# Patient Record
Sex: Female | Born: 1975
Health system: Southern US, Community
[De-identification: ages and names within clinical notes are randomized; demographics above are authoritative.]

## PROBLEM LIST (undated history)

## (undated) ENCOUNTER — Inpatient Hospital Stay (HOSPITAL_COMMUNITY): Payer: Self-pay

## (undated) DIAGNOSIS — N200 Calculus of kidney: Secondary | ICD-10-CM

## (undated) DIAGNOSIS — M51369 Other intervertebral disc degeneration, lumbar region without mention of lumbar back pain or lower extremity pain: Secondary | ICD-10-CM

## (undated) DIAGNOSIS — IMO0002 Reserved for concepts with insufficient information to code with codable children: Secondary | ICD-10-CM

## (undated) DIAGNOSIS — M5136 Other intervertebral disc degeneration, lumbar region: Secondary | ICD-10-CM

## (undated) DIAGNOSIS — F419 Anxiety disorder, unspecified: Secondary | ICD-10-CM

## (undated) DIAGNOSIS — K589 Irritable bowel syndrome without diarrhea: Secondary | ICD-10-CM

## (undated) DIAGNOSIS — Z9889 Other specified postprocedural states: Secondary | ICD-10-CM

## (undated) HISTORY — DX: Calculus of kidney: N20.0

## (undated) HISTORY — DX: Irritable bowel syndrome, unspecified: K58.9

## (undated) HISTORY — DX: Anxiety disorder, unspecified: F41.9

## (undated) HISTORY — DX: Other specified postprocedural states: Z98.890

---

## 1998-05-28 ENCOUNTER — Other Ambulatory Visit: Admission: RE | Admit: 1998-05-28 | Discharge: 1998-05-28 | Payer: Self-pay | Admitting: Obstetrics & Gynecology

## 1999-07-01 ENCOUNTER — Other Ambulatory Visit: Admission: RE | Admit: 1999-07-01 | Discharge: 1999-07-01 | Payer: Self-pay | Admitting: Obstetrics & Gynecology

## 2001-01-05 ENCOUNTER — Encounter: Payer: Self-pay | Admitting: Emergency Medicine

## 2001-01-05 ENCOUNTER — Emergency Department (HOSPITAL_COMMUNITY): Admission: EM | Admit: 2001-01-05 | Discharge: 2001-01-05 | Payer: Self-pay | Admitting: Emergency Medicine

## 2001-05-30 ENCOUNTER — Ambulatory Visit (HOSPITAL_COMMUNITY): Admission: RE | Admit: 2001-05-30 | Discharge: 2001-05-30 | Payer: Self-pay | Admitting: Family Medicine

## 2001-05-30 ENCOUNTER — Encounter: Payer: Self-pay | Admitting: Family Medicine

## 2002-01-01 ENCOUNTER — Encounter: Payer: Self-pay | Admitting: Family Medicine

## 2002-01-01 ENCOUNTER — Ambulatory Visit (HOSPITAL_COMMUNITY): Admission: RE | Admit: 2002-01-01 | Discharge: 2002-01-01 | Payer: Self-pay | Admitting: Family Medicine

## 2002-04-28 ENCOUNTER — Encounter: Payer: Self-pay | Admitting: Emergency Medicine

## 2002-04-28 ENCOUNTER — Emergency Department (HOSPITAL_COMMUNITY): Admission: EM | Admit: 2002-04-28 | Discharge: 2002-04-28 | Payer: Self-pay | Admitting: Emergency Medicine

## 2002-05-14 ENCOUNTER — Other Ambulatory Visit: Admission: RE | Admit: 2002-05-14 | Discharge: 2002-05-14 | Payer: Self-pay | Admitting: Obstetrics and Gynecology

## 2003-01-31 ENCOUNTER — Ambulatory Visit (HOSPITAL_COMMUNITY): Admission: RE | Admit: 2003-01-31 | Discharge: 2003-01-31 | Payer: Self-pay | Admitting: Family Medicine

## 2003-01-31 ENCOUNTER — Encounter: Payer: Self-pay | Admitting: Family Medicine

## 2003-07-11 ENCOUNTER — Emergency Department (HOSPITAL_COMMUNITY): Admission: EM | Admit: 2003-07-11 | Discharge: 2003-07-11 | Payer: Self-pay | Admitting: Emergency Medicine

## 2004-02-27 ENCOUNTER — Other Ambulatory Visit: Admission: RE | Admit: 2004-02-27 | Discharge: 2004-02-27 | Payer: Self-pay | Admitting: Obstetrics & Gynecology

## 2004-03-02 ENCOUNTER — Encounter: Admission: RE | Admit: 2004-03-02 | Discharge: 2004-03-02 | Payer: Self-pay | Admitting: Obstetrics & Gynecology

## 2005-03-22 ENCOUNTER — Ambulatory Visit (HOSPITAL_COMMUNITY): Admission: RE | Admit: 2005-03-22 | Discharge: 2005-03-22 | Payer: Self-pay | Admitting: Family Medicine

## 2007-06-28 ENCOUNTER — Emergency Department (HOSPITAL_COMMUNITY): Admission: EM | Admit: 2007-06-28 | Discharge: 2007-06-28 | Payer: Self-pay | Admitting: Emergency Medicine

## 2007-07-02 ENCOUNTER — Emergency Department (HOSPITAL_COMMUNITY): Admission: EM | Admit: 2007-07-02 | Discharge: 2007-07-02 | Payer: Self-pay | Admitting: Emergency Medicine

## 2007-07-03 ENCOUNTER — Emergency Department (HOSPITAL_COMMUNITY): Admission: EM | Admit: 2007-07-03 | Discharge: 2007-07-03 | Payer: Self-pay | Admitting: Emergency Medicine

## 2007-12-01 ENCOUNTER — Ambulatory Visit (HOSPITAL_COMMUNITY): Admission: RE | Admit: 2007-12-01 | Discharge: 2007-12-01 | Payer: Self-pay | Admitting: Internal Medicine

## 2007-12-13 ENCOUNTER — Encounter (HOSPITAL_COMMUNITY): Admission: RE | Admit: 2007-12-13 | Discharge: 2008-01-12 | Payer: Self-pay | Admitting: Internal Medicine

## 2007-12-20 HISTORY — PX: OTHER SURGICAL HISTORY: SHX169

## 2008-01-04 ENCOUNTER — Ambulatory Visit: Payer: Self-pay | Admitting: Internal Medicine

## 2008-01-19 ENCOUNTER — Ambulatory Visit (HOSPITAL_COMMUNITY): Admission: RE | Admit: 2008-01-19 | Discharge: 2008-01-19 | Payer: Self-pay | Admitting: Internal Medicine

## 2008-01-19 ENCOUNTER — Ambulatory Visit: Payer: Self-pay | Admitting: Internal Medicine

## 2008-02-29 ENCOUNTER — Ambulatory Visit: Payer: Self-pay | Admitting: Internal Medicine

## 2008-08-09 ENCOUNTER — Ambulatory Visit (HOSPITAL_COMMUNITY): Admission: RE | Admit: 2008-08-09 | Discharge: 2008-08-09 | Payer: Self-pay | Admitting: Obstetrics & Gynecology

## 2009-02-27 ENCOUNTER — Emergency Department (HOSPITAL_COMMUNITY): Admission: EM | Admit: 2009-02-27 | Discharge: 2009-02-27 | Payer: Self-pay | Admitting: Emergency Medicine

## 2009-06-03 ENCOUNTER — Ambulatory Visit (HOSPITAL_COMMUNITY): Admission: RE | Admit: 2009-06-03 | Discharge: 2009-06-03 | Payer: Self-pay | Admitting: Family Medicine

## 2010-07-12 ENCOUNTER — Encounter: Payer: Self-pay | Admitting: Internal Medicine

## 2010-08-24 ENCOUNTER — Ambulatory Visit (HOSPITAL_COMMUNITY)
Admission: RE | Admit: 2010-08-24 | Discharge: 2010-08-24 | Disposition: A | Discharge status: Home / Self Care | Payer: 59 | Source: Ambulatory Visit | Attending: Family Medicine | Admitting: Family Medicine

## 2010-08-24 ENCOUNTER — Other Ambulatory Visit (HOSPITAL_COMMUNITY): Payer: Self-pay | Admitting: Family Medicine

## 2010-08-24 DIAGNOSIS — R079 Chest pain, unspecified: Secondary | ICD-10-CM | POA: Insufficient documentation

## 2010-08-26 ENCOUNTER — Other Ambulatory Visit (HOSPITAL_COMMUNITY): Payer: Self-pay | Admitting: Family Medicine

## 2010-08-26 DIAGNOSIS — M940 Chondrocostal junction syndrome [Tietze]: Secondary | ICD-10-CM

## 2010-08-28 ENCOUNTER — Ambulatory Visit (HOSPITAL_COMMUNITY)
Admission: RE | Admit: 2010-08-28 | Discharge: 2010-08-28 | Disposition: A | Discharge status: Home / Self Care | Payer: 59 | Source: Ambulatory Visit | Attending: Family Medicine | Admitting: Family Medicine

## 2010-08-28 DIAGNOSIS — M940 Chondrocostal junction syndrome [Tietze]: Secondary | ICD-10-CM | POA: Insufficient documentation

## 2010-08-28 DIAGNOSIS — R109 Unspecified abdominal pain: Secondary | ICD-10-CM | POA: Insufficient documentation

## 2010-09-20 DIAGNOSIS — Z9889 Other specified postprocedural states: Secondary | ICD-10-CM

## 2010-09-20 HISTORY — DX: Other specified postprocedural states: Z98.890

## 2010-09-25 LAB — CBC
HCT: 38.4 % (ref 36.0–46.0)
MCV: 95.5 fL (ref 78.0–100.0)
RBC: 4.02 MIL/uL (ref 3.87–5.11)
WBC: 6.3 10*3/uL (ref 4.0–10.5)

## 2010-09-25 LAB — DIFFERENTIAL
Eosinophils Absolute: 0.1 10*3/uL (ref 0.0–0.7)
Eosinophils Relative: 2 % (ref 0–5)
Lymphocytes Relative: 38 % (ref 12–46)
Lymphs Abs: 2.4 10*3/uL (ref 0.7–4.0)
Monocytes Relative: 7 % (ref 3–12)

## 2010-09-25 LAB — POCT CARDIAC MARKERS
Myoglobin, poc: 51.1 ng/mL (ref 12–200)
Troponin i, poc: <0.05 ng/mL (ref 0.00–0.09)

## 2010-09-25 LAB — BASIC METABOLIC PANEL
Chloride: 104 mEq/L (ref 96–112)
GFR calc Af Amer: >60 mL/min (ref >60)
Potassium: 3.8 mEq/L (ref 3.5–5.1)
Sodium: 136 mEq/L (ref 135–145)

## 2010-09-25 LAB — PREGNANCY, URINE: Preg Test, Ur: NEGATIVE

## 2010-09-28 ENCOUNTER — Ambulatory Visit (INDEPENDENT_AMBULATORY_CARE_PROVIDER_SITE_OTHER): Payer: 59 | Admitting: Gastroenterology

## 2010-09-28 ENCOUNTER — Encounter: Payer: Self-pay | Admitting: Gastroenterology

## 2010-09-28 VITALS — BP 113/68 | HR 78 | Temp 98.3°F | Ht 65.0 in | Wt 162.5 lb

## 2010-09-28 DIAGNOSIS — R1013 Epigastric pain: Secondary | ICD-10-CM

## 2010-09-28 DIAGNOSIS — R1310 Dysphagia, unspecified: Secondary | ICD-10-CM | POA: Insufficient documentation

## 2010-09-28 DIAGNOSIS — R1314 Dysphagia, pharyngoesophageal phase: Secondary | ICD-10-CM

## 2010-09-28 DIAGNOSIS — K219 Gastro-esophageal reflux disease without esophagitis: Secondary | ICD-10-CM | POA: Insufficient documentation

## 2010-09-28 DIAGNOSIS — K589 Irritable bowel syndrome without diarrhea: Secondary | ICD-10-CM

## 2010-09-28 MED ORDER — POLYETHYLENE GLYCOL 3350 17 GM/SCOOP PO POWD: 17.0000 g | Freq: Every day | ORAL | Status: AC | PRN

## 2010-09-28 MED ORDER — ALIGN 4 MG PO CAPS
4.0000 mg | ORAL_CAPSULE | Freq: Every day | ORAL | Status: DC
Start: 2010-09-28 — End: 2011-06-03

## 2010-09-28 NOTE — Progress Notes (Signed)
Primary Care Physician:  Colette Ribas, MD  Chief Complaint  Patient presents with  . GI Problem    every now and then food wants to come back up per pt    HPI:  Brianna Davis is a 35 y.o. female here for further evaluation of abdominal pain and mild dysphagia. She was last seen by our practice back in 2009. It was for follow up of irritable bowel syndrome. At that time she also had a negative workup for gallbladder disease including a negative HIDA scan and gallbladder ultrasound. She states recently she started having epigastric pain and pain in her chest. She saw her primary care physician who thought that she had costochondritis. She was told to take ibuprofen which did not help. She reports having unremarkable chest x-ray as well. Abdominal ultrasound negative. She was advised to take Prilosec but she decided not to try it. She's been taking TUMS without relief. With regards for irritable bowel syndrome, she now has more constipation. In the past it was mostly diarrhea. She may go 5 days without a bowel movement and had multiple stools. Complains of feeling sore afterwards. Denies melena and rectal bleeding. Chest pain after ate pizza. Belching for hours after meals. No heartburn. Dysphagia to foods and carbonation. Chest pain occasionally. Milk bloats. No weight loss.   Vit D supplement OTC.   Current Outpatient Prescriptions  Medication Sig Dispense Refill  . ALPRAZolam (XANAX) 0.25 MG tablet Take 1 tablet by mouth as needed.      . polyethylene glycol powder (GLYCOLAX/MIRALAX) powder Take 17 g by mouth daily as needed.  255 g  0  . Probiotic Product (ALIGN) 4 MG CAPS Take 4 mg by mouth daily.  30 capsule  0    Allergies as of 09/28/2010  . (No Known Allergies)    Past Medical History  Diagnosis Date  . Migraine   . Nephrolithiasis   . Anxiety   . IBS (irritable bowel syndrome)     Past Surgical History  Procedure Date  . Ileocolonoscopy 12/2007    Friable anal  canal.   Family History  Problem Relation Age of Onset  . Irritable bowel syndrome Mother   . Colon cancer Neg Hx   . Inflammatory bowel disease Neg Hx   . Liver disease Neg Hx   . GI problems Sister     gastroparesis      History   Social History  . Marital Status: Married    Spouse Name: N/A    Number of Children: 0  . Years of Education: N/A   Occupational History  .  [employer]    works from home   Social History Main Topics  . Smoking status: Current Everyday Smoker -- 1.0 packs/day for 14 years    Types: Cigarettes  . Smokeless tobacco: Never Used  . Alcohol Use: No  . Drug Use: No  . Sexually Active: Yes -- Female partner(s)    Birth Control/ Protection: None     spouse        ROS:  General: Negative for anorexia, weight loss, fever, chills, fatigue, weakness. Eyes: Negative for vision changes.  ENT: Negative for hoarseness, difficulty swallowing , nasal congestion. CV: Negative for chest pain, angina, palpitations, dyspnea on exertion, peripheral edema.  Respiratory: Negative for dyspnea at rest, dyspnea on exertion, cough, sputum, wheezing.  GI: See history of present illness. GU:  Negative for dysuria, hematuria, urinary incontinence, urinary frequency, nocturnal urination.  MS: Negative for joint  pain, low back pain.  Derm: Negative for rash or itching.  Neuro: Negative for weakness, abnormal sensation, seizure, frequent headaches, memory loss, confusion.  Psych: Negative for anxiety, depression, suicidal ideation, hallucinations.  Endo: Negative for unusual weight change.  Heme: Negative for bruising or bleeding. Allergy: Negative for rash or hives.    Physical Examination: BP 113/68  Pulse 78  Temp 98.3 F (36.8 C)  Ht 5\' 5"  (1.651 m)  Wt 162 lb 8 oz (73.71 kg)  BMI 27.04 kg/m2  SpO2 100%  LMP 09/06/2010   General: Well-nourished, well-developed in no acute distress.  Head: Normocephalic, atraumatic.   Eyes: Conjunctiva pink, no  icterus. Mouth: Oropharyngeal mucosa moist and pink , no lesions erythema or exudate. Neck: Supple without thyromegaly, masses, or lymphadenopathy.  Lungs: Clear to auscultation bilaterally.  Heart: Regular rate and rhythm, no murmurs rubs or gallops.  Abdomen: Bowel sounds are normal, epigastric tenderness, nondistended, no hepatosplenomegaly or masses, no abdominal bruits or    hernia , no rebound or guarding.   Extremities: No lower extremity edema.  Neuro: Alert and oriented x 4 , grossly normal neurologically.  Skin: Warm and dry, no rash or jaundice.   Psych: Alert and cooperative, normal mood and affect.

## 2010-09-28 NOTE — Assessment & Plan Note (Addendum)
Chest pain related to certain foods most likely due to GERD. Excessive belching likely due to GERD as well. Await EGD findings.

## 2010-09-28 NOTE — Assessment & Plan Note (Signed)
Progressive epigastric pain, usually related to meals. Differential diagnosis includes gastritis, peptic ulcer disease, GERD, less likely gallbladder disease given recent normal abdominal ultrasound and normal HIDA scan 2 years ago. Recommend upper endoscopy for further evaluation. I have discussed the risks, alternatives, benefits with regards to but not limited to the risk of reaction to medication, bleeding, infection, perforation and the patient is agreeable to proceed. Written consent to be obtained.

## 2010-09-28 NOTE — Assessment & Plan Note (Signed)
Dysphagia to solid foods and carbonation. New onset of symptoms. Intermittent at this time. Recommend upper endoscopy for further evaluation and possible dilation. I have discussed the risks, alternatives, benefits with regards to but not limited to the risk of reaction to medication, bleeding, infection, perforation and the patient is agreeable to proceed. Written consent to be obtained.

## 2010-09-28 NOTE — Assessment & Plan Note (Signed)
Now with more constipation. MiraLax 17 g daily as needed. Align one daily for the next 4 weeks.

## 2010-09-29 NOTE — Progress Notes (Signed)
Reviewed by R. Michael Dorette Hartel, MD FACP FACG 

## 2010-09-30 ENCOUNTER — Encounter: Payer: 59 | Admitting: Internal Medicine

## 2010-09-30 ENCOUNTER — Other Ambulatory Visit: Payer: Self-pay | Admitting: Internal Medicine

## 2010-09-30 ENCOUNTER — Ambulatory Visit (HOSPITAL_COMMUNITY)
Admission: RE | Admit: 2010-09-30 | Discharge: 2010-09-30 | Disposition: A | Discharge status: Home / Self Care | Payer: 59 | Source: Ambulatory Visit | Attending: Internal Medicine | Admitting: Internal Medicine

## 2010-09-30 DIAGNOSIS — K2940 Chronic atrophic gastritis without bleeding: Secondary | ICD-10-CM | POA: Insufficient documentation

## 2010-09-30 DIAGNOSIS — R1013 Epigastric pain: Secondary | ICD-10-CM | POA: Insufficient documentation

## 2010-09-30 DIAGNOSIS — R1310 Dysphagia, unspecified: Secondary | ICD-10-CM

## 2010-09-30 HISTORY — PX: ESOPHAGOGASTRODUODENOSCOPY: SHX1529

## 2010-10-05 NOTE — Op Note (Signed)
NAMESION, THANE            ACCOUNT NO.:  [id number]  MEDICAL RECORD NO.:  [id number]           PATIENT TYPE:  O  LOCATION:  [facility]                          FACILITY:  [facility]  PHYSICIAN:  R. Roetta Sessions, M.D. DATE OF BIRTH:  12-27-1975  DATE OF PROCEDURE:  09/30/2010 DATE OF DISCHARGE:                              OPERATIVE REPORT   INDICATIONS FOR PROCEDURE:  A 35 year old lady with chest pain related to certain intake of food along with belching, some vague dysphagia symptoms which now the patient really denies.  I suspect element of gastroesophageal reflux disease.  She has declined to take any proton pump inhibitor therapy previously, has had a gallbladder workup in the last 2 years which turned out okay.  EGD is now being done to further evaluate her symptoms.  Risks, benefits, limitations, alternatives and imponderables have been discussed, questions answered.  Please see the documentation in the medical record.  PROCEDURE NOTE:  O2 saturation, blood pressure, pulse and respirations were monitored throughout the entire procedure.  CONSCIOUS SEDATION: 1. Versed 9 mg IV. 2. Demerol 100 mg IV in divided doses. 3. Phenergan 25 mg dilutional IV push in divided doses to augment     conscious sedation.  Cetacaine spray for topical pharyngeal     anesthesia.  INSTRUMENT:  Pentax video chip system.  FINDINGS:  Examination of the tubular esophagus revealed friability at the GE junction with accentuated undulating Z-line, question short segment Barrett's esophagus.  The tubular esophagus was widely patent through the GE junction.  Stomach:  Gastric cavity was emptied and insufflated well with air.  Thorough examination of  gastric mucosa including retroflexion view of the proximal stomach, esophagogastric junction demonstrated a small hiatal hernia and diffuse submucosal gastric petechiae.  There was no ulcer or infiltrating process.  Pylorus was patent and easily  traversed.  Examination of the bulb and second portion revealed no abnormalities.  THERAPEUTIC/DIAGNOSTIC MANEUVERS PERFORMED: 1. Distal esophageal mucosa was biopsied for histologic study. 2. Gastric mucosal biopsies were taken to rule out H. Pylori.  The     patient tolerated the procedure well.  IMPRESSION: 1. Friability at the gastroesophageal junction with an accentuated     undulating Z-line versus short segment Barrett esophagus, status     post biopsy. 2. Small hiatal hernia. 3. Diffuse submucosal gastric petechiae, status post biopsy.  Patent     pylorus normal D1-D2.  I suspect the patient's symptoms are a large part reflux related.  RECOMMENDATIONS: 1. Began course of proton pump inhibitor therapy in way of Dexilant 60     mg orally daily.  We will arrange for her by the office to get a 2     weeks supply of free samples and see how she does with this regimen     and we will go from there. 2. Followup on path. 3. Further recommendations to follow.     Jonathon Bellows, M.D.     RMR/MEDQ  D:  09/30/2010  T:  10/01/2010  Job:  [id number]  cc:   Corrie Mckusick, M.D. Fax: [phone]  Electronically Signed by Lorrin Goodell M.D. on 10/05/2010  12:35:00 PM

## 2010-10-12 ENCOUNTER — Encounter: Payer: Self-pay | Admitting: Internal Medicine

## 2010-10-15 ENCOUNTER — Ambulatory Visit (INDEPENDENT_AMBULATORY_CARE_PROVIDER_SITE_OTHER): Payer: 59 | Admitting: Gastroenterology

## 2010-10-15 ENCOUNTER — Encounter: Payer: Self-pay | Admitting: Gastroenterology

## 2010-10-15 DIAGNOSIS — R1310 Dysphagia, unspecified: Secondary | ICD-10-CM

## 2010-10-15 DIAGNOSIS — K219 Gastro-esophageal reflux disease without esophagitis: Secondary | ICD-10-CM

## 2010-10-15 MED ORDER — DEXLANSOPRAZOLE 60 MG PO CPDR: 60.0000 mg | DELAYED_RELEASE_CAPSULE | Freq: Every day | ORAL | Status: AC

## 2010-10-15 NOTE — Progress Notes (Signed)
Referring Provider: Colette Ribas, MD Primary Care Physician:  Colette Ribas, MD Primary Gastroenterologist: Dr. Jena Gauss  Chief Complaint  Patient presents with  . Follow-up    HPI:   Brianna Davis is a pleasant 35 year old Caucasian female who presents in f/u from upper endoscopy performed April 2012 secondary to chest pain, belching, vague dysphagia symptoms. Had declined PPI therapy in past. EGD with findings of friability at GE junction, gastric petechia, -h.pylori, -Barrett's. Started on Dexilant, and she notices significant improvement with this. Hurting in chest has lessened. No difficulty swallowing. Small amt of burping but better. Takes probiotic when remembers. Has lost 4 lbs in 2 weeks, but this is purposeful. Doing well and pleased overall.   Past Medical History  Diagnosis Date  . Migraine   . Nephrolithiasis   . Anxiety   . IBS (irritable bowel syndrome)   . S/P endoscopy April 2012    Dr. Jena Gauss: friability at GE junction, negative Barrett's orH.pylori, diffuse gastric petechia    Past Surgical History  Procedure Date  . Ileocolonoscopy 12/2007    Friable anal canal.    Current Outpatient Prescriptions  Medication Sig Dispense Refill  . ALPRAZolam (XANAX) 0.25 MG tablet Take 1 tablet by mouth as needed.      Marland Kitchen dexlansoprazole (DEXILANT) 60 MG capsule Take 60 mg by mouth daily.        . Probiotic Product (ALIGN) 4 MG CAPS Take 4 mg by mouth daily.  30 capsule  0      31 capsule  3    Allergies as of 10/15/2010  . (No Known Allergies)    Family History  Problem Relation Age of Onset  . Irritable bowel syndrome Mother   . Colon cancer Neg Hx   . Inflammatory bowel disease Neg Hx   . Liver disease Neg Hx   . GI problems Sister     gastroparesis    History   Social History  . Marital Status: Married    Spouse Name: N/A    Number of Children: 0  . Years of Education: N/A   Occupational History  .  [employer]    works from home    Social History Main Topics  . Smoking status: Current Everyday Smoker -- 1.0 packs/day for 14 years    Types: Cigarettes  . Smokeless tobacco: Never Used  . Alcohol Use: No  . Drug Use: No  . Sexually Active: Yes -- Female partner(s)    Birth Control/ Protection: None     spouse    Review of Systems: Gen: Denies fever, chills, anorexia. Denies fatigue, weakness, weight loss.  CV: Denies chest pain, palpitations, syncope, peripheral edema, and claudication. Resp: Denies dyspnea at rest, cough, wheezing, coughing up blood, and pleurisy. GI: Denies vomiting blood, jaundice, and fecal incontinence.   Denies dysphagia or odynophagia. Derm: Denies rash, itching, dry skin Psych: Denies depression, anxiety, memory loss, confusion. No homicidal or suicidal ideation.  Heme: Denies bruising, bleeding, and enlarged lymph nodes.  Physical Exam: BP 104/70  Pulse 65  Temp 98.8 F (37.1 C)  Ht 5\' 8"  (1.727 m)  Wt 159 lb 6.4 oz (72.303 kg)  BMI 24.24 kg/m2  LMP 10/07/2010 General:   Alert and oriented. No distress noted. Pleasant and cooperative.  Head:  Normocephalic and atraumatic. Eyes:  Conjuctiva clear without scleral icterus. Mouth:  Oral mucosa pink and moist. Good dentition. No lesions. Heart:  S1, S2 present without murmurs, rubs, or gallops. Regular rate and rhythm. Abdomen:  +  BS, soft, non-tender and non-distended. No rebound or guarding. No HSM or masses noted. Msk:  Symmetrical without gross deformities. Normal posture. Extremities:  Without edema. Neurologic:  Alert and  oriented x4;  grossly normal neurologically. Skin:  Intact without significant lesions or rashes. Psych:  Alert and cooperative. Normal mood and affect.

## 2010-10-15 NOTE — Patient Instructions (Signed)
Continue Dexilant.  Continue efforts with weight loss! You are doing great!  Continue Probiotic.    We will see you back in about 3 months.

## 2010-10-19 ENCOUNTER — Encounter: Payer: Self-pay | Admitting: Gastroenterology

## 2010-10-19 NOTE — Assessment & Plan Note (Signed)
Recent EGD April 2012. Significantly improved since starting PPI. No dysphagia or odynophagia. Continue Dexilant. Rx provided.   Return in 3 mos Continue Dexilant Continue Probiotic

## 2010-10-19 NOTE — Progress Notes (Signed)
Cc to PCP 

## 2010-10-19 NOTE — Assessment & Plan Note (Signed)
Resolved. Continue Dexilant.  

## 2010-11-03 NOTE — Assessment & Plan Note (Signed)
NAMEMarland Kitchen  LACONYA, CLERE             CHART#:  [id number]   DATE:  01/04/2008                       DOB:  June 26, 1975   REASON CONSULTATION:  Abdominal cramps, diarrhea, and blood per rectum.   HISTORY OF PRESENT ILLNESS:  The patient is a very pleasant 35 year old  Caucasian female from [facility] courtesy Dr. Elfredia Nevins to  further evaluate more or less of 1-year history of intermittent  abdominal cramps and diarrhea.  She has abdominal cramps worsened in  May, during a vacation at the beach.  She had an episode of nausea and  vomiting.  Abdominal cramps sometimes come after a meal and at other  times as well.  She is awakened from sound sleep with abdominal cramps  and has had worsening of intermittent chronic diarrhea and has passed  some gross blood per rectum on occasion over the past 2 months.  She had  similar symptoms 1 year ago for which she was given some dicyclomine,  which she felt made her symptoms actually worse and describes undergoing  a CT scan in October or November at [facility], which  demonstrated no significant findings.  She has been on antibiotics just  recently by her dentist because of an abscessed tooth.   Workup at the Dr. Sharyon Medicus office included abdominal ultrasound, which  was reportedly negative.  She also had a HIDA scan with fatty meal,  which demonstrated a gallbladder EF of 52%.  She had a celiac antibody  panel, which came back negative.  CBC came back looking good as did the  Chem-20.  Amylase also came back normal.  She states she may have lost 5  pounds over the past 2 months.  She rarely has a formed stool, but can  be awakened in the middle of the night with abdominal cramps and need to  have a bowel movement.   She does not have any reflux symptoms, odynophagia, dysphagia, or early  satiety.  She had one episode of nausea and vomiting at the beach in  May.  There is no family history of colorectal cancer or inflammatory  bowel disease.  She does take Gas-X on occasion for abdominal cramps and  rumbling with modest improvement in her symptoms.   PAST MEDICAL HISTORY:  Significant for migraines, on no specific  therapy.  She takes an occasional Advil.   PAST SURGERIES:  None.  She unfortunately had a miscarriage at 2 months  in her pregnancy on July 20, 2007.   CURRENT MEDICATIONS:  Amoxicillin.   ALLERGIES:  No known drug allergies.   FAMILY HISTORY:  Mother has irritable bowel syndrome.  Otherwise, no  history of chronic GI or liver illness.   SOCIAL HISTORY:  The patient is married and has no children.  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  She smokes a half  pack of cigarettes per day.  No alcohol.  She drinks 4 of 12 ounce cans  of [place] daily, but no other caffeinated beverages.  No alcohol.  No illicit drugs.   REVIEW OF SYSTEMS:  As in the history of present illness.  No fever,  night sweats, chest pain, or dyspnea on exertion.  She has difficulty  falling asleep.   PHYSICAL EXAMINATION:  GENERAL:  A pleasant 35 year old lady resting  comfortably.  VITAL SIGNS:  Weight 138, height 5 foot 2 inches, temperature 98, BP  98/70, and pulse 76.  SKIN:  Warm and dry.  He has freckles.  HEENT:  No scleral icterus.  Conjunctivae are pink.  CHEST:  Lungs are clear to auscultation.  CARDIAC:  Regular rate and rhythm without murmur, gallop, or rub.  BREASTS:  Deferred.  ABDOMEN:  Nondistended.  Positive bowel sounds and soft.  She has  minimal left lower quadrant tenderness to palpation.  No appreciable  mass or organomegaly.  EXTREMITIES:  No edema.  RECTAL:  Deferred to the time of colonoscopy.   IMPRESSION:  The patient is a very pleasant 35 year old lady who has  really a 1-year history of intermittent abdominal cramps and diarrhea.  These symptoms have worsened significantly since May.  She has nocturnal  symptoms of abdominal cramps and diarrhea as well  and has had blood per  rectum recently.   She certainly could have an element of irritable bowel syndrome, but the  nocturnal bowel features would be atypical, and certainly blood per  rectum is not consistent with irritable bowel syndrome.  Worsening of  symptoms in May maybe related to a superimposed gastroenteritis.   New onset, inflammatory bowel disease also remains in the differential.   RECOMMENDATIONS:  I told the patient we really needed to go ahead and do  a colonoscopy right off the bat.  Risks, benefits, and alternatives have  been reviewed, questions answered, and she is agreeable.  We will go  ahead and give her a probiotic in a way of Align 1 capsule daily for the  next month.  Samples provided.  We will also try out some Levsin  sublingually 0.125 mg 20 minutes a.c. and nightly to be taken only, if  she has episodes of abdominal cramping and diarrhea.  I will make  further recommendations once colonoscopic evaluation has been performed.  In addition, I asked ther to cut consumption of mountain Des in half as  it may contributing to her diarrhea and insomnia.   Thank you Dr. Elfredia Nevins for me to see this nice lady today.       Jonathon Bellows, M.D.  Electronically Signed     RMR/MEDQ  D:  01/04/2008  T:  01/05/2008  Job:  [id number]   cc:   Madelin Rear. Sherwood Gambler, MD

## 2010-11-03 NOTE — Assessment & Plan Note (Signed)
NAMEMarland Kitchen  MAELYS, KINNICK             CHART#:  [id number]   DATE:  02/29/2008                       DOB:  07-Feb-1976   CHIEF COMPLAINT:  Followup IBS.   PROBLEM LIST:  1. Irritable bowel syndrome.  2. Friable anal canal and otherwise, normal ileocolonoscopy by Dr.      Jena Gauss on 01/19/2008.  3. History of nephrolithiasis.  4. History of ovarian cyst.  5. History of migraine headaches.  6. History of miscarriage of 2 months in January 2009.   SUBJECTIVE:  The patient is a 35 year old Caucasian female.  She  underwent ileocolonoscopy with Dr. Jena Gauss.  She was found to have a  normal exam of her friable anal canal.  She has done well with Levsin.  This does seem to help with her symptoms of chronic diarrhea and  abdominal cramping.  She does have some transient left lower quadrant  abdominal pain, which she describes more as a discomfort.  It can last  anywhere from a couple of minutes to an hour.  She notes that her weight  has remained stable.  She tells me she is getting ready to start Clomid  and was found to have low progesterone levels per her GYN.  She is  trying to conceive.  She has previously had a negative celiac antibody  panel.  Her last menstrual period was approximately 2 days ago.   CURRENT MEDICATIONS:  See the list from 02/29/2008.   ALLERGIES:  No known drug allergies.   OBJECTIVE:  VITAL SIGNS:  Weight 137 pounds, height 62 inches,  temperature 98.2, blood pressure 100/72, and pulse 64.  GENERAL:  She is a well-developed and well-nourished Caucasian female,  in no acute distress.  HEENT:  Sclerae clear and nonicteric.  Conjunctivae pink.  Oropharynx  pink and moist without any lesions.  CHEST:  Heart regular rate and rhythm.  Normal S1 and S2 without  murmurs, clicks, rubs, or gallops.  ABDOMEN:  Positive bowel sounds x4.  No bruits auscultated.  Soft,  nontender, and nondistended without palpable mass or hepatosplenomegaly.  No rebound, tenderness, or  guarding.  EXTREMITIES:  Without clubbing or edema.  SKIN:  Pink, warm, and dry without any rash or jaundice.   ASSESSMENT:  The patient is a 35 year old Caucasian female with  irritable bowel syndrome, doing well on antispasmodics.  She does have  some transient left lower quadrant pain that does not necessarily  associated with bowel change and bowel habits and she is going to  continue to keep an eye on this and document her symptoms and call us if  she has any problems.   PLAN:  1. IBS literature.  2. Align probiotics.  3. Fiber supplement of choice.  Given her Benefiber samples today.  4. Levsin 0.125 mg a.c. and nightly p.r.n. diarrhea.  5. We will request recent CT report from fall of 2008 for records for      her chart.  6. Office visit in 3 months with Dr. Jena Gauss or sooner if needed.  7. We did discuss the chronic nature of IBS and treatment modalities.        Lorenza Burton, N.P.  Electronically Signed     R. Roetta Sessions, M.D.  Electronically Signed    KJ/MEDQ  D:  02/29/2008  T:  02/29/2008  Job:  [id number]  cc:   Madelin Rear. Sherwood Gambler, MD

## 2010-11-03 NOTE — Op Note (Signed)
NAME:  Brianna Davis, HARRIES            ACCOUNT NO.:  [id number]   MEDICAL RECORD NO.:  [id number]          PATIENT TYPE:  AMB   LOCATION:  DAY                           FACILITY:  [facility]   PHYSICIAN:  R. Roetta Sessions, M.D. DATE OF BIRTH:  Oct 29, 1975   DATE OF PROCEDURE:  01/19/2008  DATE OF DISCHARGE:                               OPERATIVE REPORT   PROCEDURE:  Ileocolonoscopy.   INDICATIONS FOR PROCEDURE:  A 35 year old lady with 1-year history of  intermittent postprandial bowel cramps, diarrhea, and periods of some  small volume hematochezia.  Celiac panels already came back negative.  CBC and chem-20 also came back unremarkable.  Ileocolonoscopy now being  performed.  This approach has been discussed with the patient at length.  Risks, benefits, alternatives, and limitations have been reviewed.  Please see the documentation in the medical record.   PROCEDURE NOTE:  O2 saturation, blood pressure, pulse, and respirations  monitored throughout the entire procedure.   CONSCIOUS SEDATION:  Versed 7 mg IV and Demerol 125 mg IV in divided  doses.   INSTRUMENTATION:  Pentax video chip adult pediatric colonoscope.   FINDINGS:  Digital rectal exam revealed no abnormalities.  Endoscopic  findings:  Prep was good.  Colon:  Colonic mucosa was surveyed from the  rectosigmoid junction through the left transverse and right colon to the  appendiceal orifice, ileocecal valve, and cecum.  Terminal ileum was  intubated for 10 cm.  From this level, the scope slowly and cautiously  withdrawn.  All previously mentioned mucosal surfaces were again seen.  Initially, I utilized the adult scope and ran into a flexure in the  sigmoid which I could not overcome, subsequently withdrew the scope,  obtained the pediatric colonoscope, and was able to advance the scope  around with minimal difficulty.  The colonic mucosa appeared entirely  normal as did the terminal ileal mucosa.  The scope was pulled down the  rectum where thorough examination of the rectal mucosa including  retroflexed view of the anal verge.  En face view of the anal canal  demonstrated friable anal canal tissue only, rectal mucosa appeared  entirely normal.  Stool sample was submitted to the Microbiology Lab.  The patient tolerated the procedure well and was reactive to Endoscopy.   IMPRESSION:  Friable anal canal, otherwise normal rectum, colon, and  terminal ileum.   I suspect the final analysis she has diarrhea, predominant irritable  bowel syndrome with intermittent hematochezia from benign anorectal  source.   RECOMMENDATIONS:  1. Levsin 0.125 mg one sublingually before meals and bedtime p.r.n.      bowel cramps and diarrhea, 2-week course      of [place] 1 g mesalamine suppositories one per rectum at bedtime.  2. Follow up appointment with [place] in 1 month.  3. Follow up on stool studies.       Jonathon Bellows, M.D.  Electronically Signed     RMR/MEDQ  D:  01/19/2008  T:  01/20/2008  Job:  [id number]   cc:   Madelin Rear. Sherwood Gambler, MD  Fax: [phone]

## 2011-01-14 ENCOUNTER — Telehealth: Payer: Self-pay | Admitting: Gastroenterology

## 2011-01-14 ENCOUNTER — Ambulatory Visit: Payer: 59 | Admitting: Gastroenterology

## 2011-01-14 NOTE — Telephone Encounter (Signed)
Pt is a RMR PT. 

## 2011-03-10 LAB — URINALYSIS, ROUTINE W REFLEX MICROSCOPIC
Bilirubin Urine: NEGATIVE
Bilirubin Urine: NEGATIVE
Glucose, UA: NEGATIVE
Ketones, ur: NEGATIVE
Leukocytes, UA: NEGATIVE
Leukocytes, UA: NEGATIVE
Nitrite: NEGATIVE
Specific Gravity, Urine: <1.005 — ABNORMAL LOW
Specific Gravity, Urine: >1.03 — ABNORMAL HIGH
Urobilinogen, UA: 0.2
pH: 5.5
pH: 5.5

## 2011-03-10 LAB — DIFFERENTIAL
Basophils Absolute: 0
Eosinophils Relative: 0
Lymphocytes Relative: 44
Lymphs Abs: 2.7
Neutro Abs: 3

## 2011-03-10 LAB — CBC
HCT: 37.9
Hemoglobin: 13
Platelets: 294
WBC: 6.3

## 2011-03-10 LAB — URINE MICROSCOPIC-ADD ON

## 2011-03-10 LAB — WET PREP, GENITAL: Yeast Wet Prep HPF POC: NONE SEEN

## 2011-03-10 LAB — GC/CHLAMYDIA PROBE AMP, GENITAL
Chlamydia, DNA Probe: NEGATIVE
GC Probe Amp, Genital: NEGATIVE

## 2011-03-10 LAB — PREGNANCY, URINE: Preg Test, Ur: POSITIVE

## 2011-03-10 LAB — HCG, QUANTITATIVE, PREGNANCY: hCG, Beta Chain, Quant, S: 1528 — ABNORMAL HIGH

## 2011-03-11 LAB — DIFFERENTIAL
Eosinophils Relative: 0
Lymphocytes Relative: 32
Lymphs Abs: 2
Monocytes Absolute: 0.4
Monocytes Relative: 7
Neutro Abs: 3.7

## 2011-03-11 LAB — CBC
HCT: 38.4
Hemoglobin: 13.3
RBC: 4.05
RDW: 12.1

## 2011-03-11 LAB — URINE MICROSCOPIC-ADD ON

## 2011-03-11 LAB — URINALYSIS, ROUTINE W REFLEX MICROSCOPIC
Glucose, UA: 500 — AB
Ketones, ur: 15 — AB
Protein, ur: >300 — AB
Urobilinogen, UA: >8 — ABNORMAL HIGH

## 2011-03-19 LAB — FECAL LACTOFERRIN, QUANT: Fecal Lactoferrin: POSITIVE

## 2011-03-19 LAB — CLOSTRIDIUM DIFFICILE EIA: C difficile Toxins A+B, EIA: NEGATIVE

## 2011-03-19 LAB — OVA AND PARASITE EXAMINATION: Ova and parasites: NONE SEEN

## 2011-03-19 LAB — STOOL CULTURE

## 2011-04-07 ENCOUNTER — Other Ambulatory Visit: Payer: Self-pay | Admitting: Family Medicine

## 2011-04-07 DIAGNOSIS — R51 Headache: Secondary | ICD-10-CM

## 2011-04-14 ENCOUNTER — Ambulatory Visit (HOSPITAL_COMMUNITY)
Admission: RE | Admit: 2011-04-14 | Discharge: 2011-04-14 | Disposition: A | Discharge status: Home / Self Care | Payer: 59 | Source: Ambulatory Visit | Attending: Family Medicine | Admitting: Family Medicine

## 2011-04-14 DIAGNOSIS — R51 Headache: Secondary | ICD-10-CM | POA: Insufficient documentation

## 2011-04-14 DIAGNOSIS — J320 Chronic maxillary sinusitis: Secondary | ICD-10-CM | POA: Insufficient documentation

## 2011-06-03 ENCOUNTER — Emergency Department (HOSPITAL_COMMUNITY): Payer: 59

## 2011-06-03 ENCOUNTER — Emergency Department (HOSPITAL_COMMUNITY)
Admission: EM | Admit: 2011-06-03 | Discharge: 2011-06-03 | Disposition: A | Discharge status: Home / Self Care | Payer: 59 | Attending: Emergency Medicine | Admitting: Emergency Medicine

## 2011-06-03 ENCOUNTER — Encounter (HOSPITAL_COMMUNITY): Payer: Self-pay | Admitting: Emergency Medicine

## 2011-06-03 DIAGNOSIS — F411 Generalized anxiety disorder: Secondary | ICD-10-CM | POA: Insufficient documentation

## 2011-06-03 DIAGNOSIS — K589 Irritable bowel syndrome without diarrhea: Secondary | ICD-10-CM | POA: Insufficient documentation

## 2011-06-03 DIAGNOSIS — R109 Unspecified abdominal pain: Secondary | ICD-10-CM | POA: Insufficient documentation

## 2011-06-03 LAB — DIFFERENTIAL
Basophils Absolute: 0 10*3/uL (ref 0.0–0.1)
Eosinophils Absolute: 0.2 10*3/uL (ref 0.0–0.7)
Eosinophils Relative: 3 % (ref 0–5)
Lymphocytes Relative: 42 % (ref 12–46)
Neutrophils Relative %: 50 % (ref 43–77)

## 2011-06-03 LAB — CBC
MCH: 32.3 pg (ref 26.0–34.0)
MCV: 91.7 fL (ref 78.0–100.0)
Platelets: 322 10*3/uL (ref 150–400)
RDW: 12.3 % (ref 11.5–15.5)
WBC: 6.1 10*3/uL (ref 4.0–10.5)

## 2011-06-03 LAB — COMPREHENSIVE METABOLIC PANEL
ALT: 10 U/L (ref 0–35)
AST: 12 U/L (ref 0–37)
Alkaline Phosphatase: 78 U/L (ref 39–117)
Calcium: 9.5 mg/dL (ref 8.4–10.5)
Potassium: 3.3 mEq/L — ABNORMAL LOW (ref 3.5–5.1)
Sodium: 139 mEq/L (ref 135–145)
Total Protein: 7.5 g/dL (ref 6.0–8.3)

## 2011-06-03 LAB — URINALYSIS, ROUTINE W REFLEX MICROSCOPIC
Hgb urine dipstick: NEGATIVE
Nitrite: NEGATIVE
Protein, ur: NEGATIVE mg/dL
Specific Gravity, Urine: 1.015 (ref 1.005–1.030)
Urobilinogen, UA: 0.2 mg/dL (ref 0.0–1.0)

## 2011-06-03 MED ORDER — ONDANSETRON HCL 4 MG/2ML IJ SOLN
4.0000 mg | Freq: Once | INTRAMUSCULAR | Status: AC
  Administered 2011-06-03: 4 mg via INTRAVENOUS
  Filled 2011-06-03: qty 2

## 2011-06-03 MED ORDER — OXYCODONE-ACETAMINOPHEN 5-325 MG PO TABS: 1.0000 | ORAL_TABLET | ORAL | Status: AC | PRN

## 2011-06-03 MED ORDER — HYDROMORPHONE HCL PF 1 MG/ML IJ SOLN
1.0000 mg | Freq: Once | INTRAMUSCULAR | Status: AC
  Administered 2011-06-03: 1 mg via INTRAVENOUS
  Filled 2011-06-03: qty 1

## 2011-06-03 NOTE — ED Notes (Signed)
Pr requesting something for pain, pt informed that she can not have anything for pain until the EDP has been in to assess her

## 2011-06-03 NOTE — ED Notes (Signed)
Pt c/o right flank pain x 2 hours with nausea.

## 2011-06-03 NOTE — ED Provider Notes (Signed)
History     CSN: [id number] Arrival date & time: 06/03/2011  1:42 PM   First MD Initiated Contact with Patient 06/03/11 1437      Chief Complaint  Patient presents with  . Flank Pain    (Consider location/radiation/quality/duration/timing/severity/associated sxs/prior treatment) Patient is a 35 y.o. female presenting with flank pain. The history is provided by the patient. No language interpreter was used.  Flank Pain This is a new problem. The current episode started 1 to 2 hours ago. The problem occurs constantly. The problem has been gradually improving. Associated symptoms include abdominal pain. The symptoms are aggravated by nothing. The symptoms are relieved by nothing. She has tried nothing for the symptoms.    Past Medical History  Diagnosis Date  . Migraine   . Nephrolithiasis   . Anxiety   . IBS (irritable bowel syndrome)   . S/P endoscopy April 2012    Dr. Jena Gauss: friability at GE junction, negative Barrett's orH.pylori, diffuse gastric petechia    Past Surgical History  Procedure Date  . Ileocolonoscopy 12/2007    Friable anal canal.    Family History  Problem Relation Age of Onset  . Irritable bowel syndrome Mother   . Colon cancer Neg Hx   . Inflammatory bowel disease Neg Hx   . Liver disease Neg Hx   . GI problems Sister     gastroparesis    History  Substance Use Topics  . Smoking status: Current Everyday Smoker -- 1.0 packs/day for 14 years    Types: Cigarettes  . Smokeless tobacco: Never Used  . Alcohol Use: No    OB History    Grav Para Term Preterm Abortions TAB SAB Ect Mult Living                  Review of Systems  Constitutional: Negative.  Negative for fever.  HENT: Negative.   Eyes: Negative.   Respiratory: Negative.   Cardiovascular: Negative.   Gastrointestinal: Positive for abdominal pain.  Genitourinary: Positive for flank pain.  Musculoskeletal: Negative.   Skin: Negative.   Neurological: Negative.     Psychiatric/Behavioral: Negative.     Allergies  Review of patient's allergies indicates no known allergies.  Home Medications   Current Outpatient Rx  Name Route Sig Dispense Refill  . ALPRAZOLAM 0.25 MG PO TABS Oral Take 1 tablet by mouth as needed.    . DEXLANSOPRAZOLE 60 MG PO CPDR Oral Take 60 mg by mouth daily.      Marland Kitchen ALIGN 4 MG PO CAPS Oral Take 4 mg by mouth daily. 30 capsule 0    BP 121/78  Pulse 87  Temp 98.4 F (36.9 C)  Resp 20  Ht 5\' 3"  (1.6 m)  Wt 147 lb (66.679 kg)  BMI 26.04 kg/m2  SpO2 100%  LMP 05/27/2011  Physical Exam  Nursing note and vitals reviewed. Constitutional: She is oriented to person, place, and time. She appears well-developed and well-nourished. Distressed: in moderate distress with right flank pain.  HENT:  Head: Normocephalic and atraumatic.  Right Ear: External ear normal.  Left Ear: External ear normal.  Mouth/Throat: Oropharynx is clear and moist.  Eyes: Conjunctivae and EOM are normal. Pupils are equal, round, and reactive to light. No scleral icterus.  Neck: Normal range of motion. Neck supple.  Cardiovascular: Normal rate, regular rhythm and normal heart sounds.   Pulmonary/Chest: Effort normal and breath sounds normal.  Abdominal: Soft. Bowel sounds are normal. She exhibits no distension. There is no  tenderness.       She localizes her pain to the right CVA region into the right upper abdominal quadrant. There is no palpable deformity or point tenderness there.  Musculoskeletal: Normal range of motion.  Neurological: She is alert and oriented to person, place, and time.       No sensory or motor loss.  Skin: Skin is warm and dry.  Psychiatric: She has a normal mood and affect. Her behavior is normal.    ED Course  Procedures (including critical care time)  Labs Reviewed  URINALYSIS, ROUTINE W REFLEX MICROSCOPIC - Abnormal; Notable for the following:    APPearance HAZY (*)    All other components within normal limits  CBC   DIFFERENTIAL  COMPREHENSIVE METABOLIC PANEL  LIPASE, BLOOD  URINE CULTURE  PREGNANCY, URINE   2:54 PM Patient was seen and had physical examination. Laboratory tests and CT of the abdomen and pelvis without contrast were ordered. IV Dilaudid and Zofran was ordered.  4:09 PM CBC chemistries, and urinalysis were negative. CT of the chest and abdomen without contrast showed no kidney stone or ureteral calculus. Gallbladder was also normal in appearance on CT.  Patient continues to complain of right flank pain she feels is just like what she had when she had kidney stones. I advised her that we would treat her as if she had had a stone seen. Effort take Percocet as needed for pain and strain her urine try and catch a stone. She should follow this up with her urologist, Bjorn Pippin M.D. in [facility]  1. Right flank pain             Carleene Cooper III, MD 06/03/11 [phone]

## 2011-06-04 LAB — URINE CULTURE
Colony Count: NO GROWTH
Culture  Setup Time: 201212131650
Culture: NO GROWTH

## 2011-08-23 ENCOUNTER — Emergency Department (HOSPITAL_COMMUNITY)
Admission: EM | Admit: 2011-08-23 | Discharge: 2011-08-23 | Disposition: A | Discharge status: Home / Self Care | Payer: 59 | Attending: Emergency Medicine | Admitting: Emergency Medicine

## 2011-08-23 ENCOUNTER — Encounter (HOSPITAL_COMMUNITY): Payer: Self-pay | Admitting: *Deleted

## 2011-08-23 DIAGNOSIS — F17200 Nicotine dependence, unspecified, uncomplicated: Secondary | ICD-10-CM | POA: Insufficient documentation

## 2011-08-23 DIAGNOSIS — Z87442 Personal history of urinary calculi: Secondary | ICD-10-CM | POA: Insufficient documentation

## 2011-08-23 DIAGNOSIS — R51 Headache: Secondary | ICD-10-CM | POA: Insufficient documentation

## 2011-08-23 MED ORDER — SODIUM CHLORIDE 0.9 % IV BOLUS (SEPSIS)
1000.0000 mL | Freq: Once | INTRAVENOUS | Status: AC
  Administered 2011-08-23: 1000 mL via INTRAVENOUS

## 2011-08-23 MED ORDER — KETOROLAC TROMETHAMINE 30 MG/ML IJ SOLN
15.0000 mg | Freq: Once | INTRAMUSCULAR | Status: AC
  Administered 2011-08-23: 15 mg via INTRAVENOUS
  Filled 2011-08-23: qty 1

## 2011-08-23 MED ORDER — DIPHENHYDRAMINE HCL 50 MG/ML IJ SOLN
25.0000 mg | Freq: Once | INTRAMUSCULAR | Status: AC
  Administered 2011-08-23: 25 mg via INTRAVENOUS
  Filled 2011-08-23: qty 1

## 2011-08-23 MED ORDER — METOCLOPRAMIDE HCL 5 MG/ML IJ SOLN
10.0000 mg | Freq: Once | INTRAMUSCULAR | Status: AC
  Administered 2011-08-23: 10 mg via INTRAVENOUS
  Filled 2011-08-23: qty 2

## 2011-08-23 NOTE — ED Notes (Signed)
Pt reports relief of headache.

## 2011-08-23 NOTE — Discharge Instructions (Signed)
Headaches, Frequently Asked Questions MIGRAINE HEADACHES Q: What is migraine? What causes it? How can I treat it? A: Generally, migraine headaches begin as a dull ache. Then they develop into a constant, throbbing, and pulsating pain. You may experience pain at the temples. You may experience pain at the front or back of one or both sides of the head. The pain is usually accompanied by a combination of:  Nausea.   Vomiting.   Sensitivity to light and noise.  Some people (about 15%) experience an aura (see below) before an attack. The cause of migraine is believed to be chemical reactions in the brain. Treatment for migraine may include over-the-counter or prescription medications. It may also include self-help techniques. These include relaxation training and biofeedback.  Q: What is an aura? A: About 15% of people with migraine get an "aura". This is a sign of neurological symptoms that occur before a migraine headache. You may see wavy or jagged lines, dots, or flashing lights. You might experience tunnel vision or blind spots in one or both eyes. The aura can include visual or auditory hallucinations (something imagined). It may include disruptions in smell (such as strange odors), taste or touch. Other symptoms include:  Numbness.   A "pins and needles" sensation.   Difficulty in recalling or speaking the correct word.  These neurological events may last as long as 60 minutes. These symptoms will fade as the headache begins. Q: What is a trigger? A: Certain physical or environmental factors can lead to or "trigger" a migraine. These include:  Foods.   Hormonal changes.   Weather.   Stress.  It is important to remember that triggers are different for everyone. To help prevent migraine attacks, you need to figure out which triggers affect you. Keep a headache diary. This is a good way to track triggers. The diary will help you talk to your healthcare professional about your  condition. Q: Does weather affect migraines? A: Bright sunshine, hot, humid conditions, and drastic changes in barometric pressure may lead to, or "trigger," a migraine attack in some people. But studies have shown that weather does not act as a trigger for everyone with migraines. Q: What is the link between migraine and hormones? A: Hormones start and regulate many of your body's functions. Hormones keep your body in balance within a constantly changing environment. The levels of hormones in your body are unbalanced at times. Examples are during menstruation, pregnancy, or menopause. That can lead to a migraine attack. In fact, about three quarters of all women with migraine report that their attacks are related to the menstrual cycle.  Q: Is there an increased risk of stroke for migraine sufferers? A: The likelihood of a migraine attack causing a stroke is very remote. That is not to say that migraine sufferers cannot have a stroke associated with their migraines. In persons under age 40, the most common associated factor for stroke is migraine headache. But over the course of a person's normal life span, the occurrence of migraine headache may actually be associated with a reduced risk of dying from cerebrovascular disease due to stroke.  Q: What are acute medications for migraine? A: Acute medications are used to treat the pain of the headache after it has started. Examples over-the-counter medications, NSAIDs, ergots, and triptans.  Q: What are the triptans? A: Triptans are the newest class of abortive medications. They are specifically targeted to treat migraine. Triptans are vasoconstrictors. They moderate some chemical reactions in the brain.   The triptans work on receptors in your brain. Triptans help to restore the balance of a neurotransmitter called serotonin. Fluctuations in levels of serotonin are thought to be a main cause of migraine.  Q: Are over-the-counter medications for migraine  effective? A: Over-the-counter, or "OTC," medications may be effective in relieving mild to moderate pain and associated symptoms of migraine. But you should see your caregiver before beginning any treatment regimen for migraine.  Q: What are preventive medications for migraine? A: Preventive medications for migraine are sometimes referred to as "prophylactic" treatments. They are used to reduce the frequency, severity, and length of migraine attacks. Examples of preventive medications include antiepileptic medications, antidepressants, beta-blockers, calcium channel blockers, and NSAIDs (nonsteroidal anti-inflammatory drugs). Q: Why are anticonvulsants used to treat migraine? A: During the past few years, there has been an increased interest in antiepileptic drugs for the prevention of migraine. They are sometimes referred to as "anticonvulsants". Both epilepsy and migraine may be caused by similar reactions in the brain.  Q: Why are antidepressants used to treat migraine? A: Antidepressants are typically used to treat people with depression. They may reduce migraine frequency by regulating chemical levels, such as serotonin, in the brain.  Q: What alternative therapies are used to treat migraine? A: The term "alternative therapies" is often used to describe treatments considered outside the scope of conventional Western medicine. Examples of alternative therapy include acupuncture, acupressure, and yoga. Another common alternative treatment is herbal therapy. Some herbs are believed to relieve headache pain. Always discuss alternative therapies with your caregiver before proceeding. Some herbal products contain arsenic and other toxins. TENSION HEADACHES Q: What is a tension-type headache? What causes it? How can I treat it? A: Tension-type headaches occur randomly. They are often the result of temporary stress, anxiety, fatigue, or anger. Symptoms include soreness in your temples, a tightening  band-like sensation around your head (a "vice-like" ache). Symptoms can also include a pulling feeling, pressure sensations, and contracting head and neck muscles. The headache begins in your forehead, temples, or the back of your head and neck. Treatment for tension-type headache may include over-the-counter or prescription medications. Treatment may also include self-help techniques such as relaxation training and biofeedback. CLUSTER HEADACHES Q: What is a cluster headache? What causes it? How can I treat it? A: Cluster headache gets its name because the attacks come in groups. The pain arrives with little, if any, warning. It is usually on one side of the head. A tearing or bloodshot eye and a runny nose on the same side of the headache may also accompany the pain. Cluster headaches are believed to be caused by chemical reactions in the brain. They have been described as the most severe and intense of any headache type. Treatment for cluster headache includes prescription medication and oxygen. SINUS HEADACHES Q: What is a sinus headache? What causes it? How can I treat it? A: When a cavity in the bones of the face and skull (a sinus) becomes inflamed, the inflammation will cause localized pain. This condition is usually the result of an allergic reaction, a tumor, or an infection. If your headache is caused by a sinus blockage, such as an infection, you will probably have a fever. An x-ray will confirm a sinus blockage. Your caregiver's treatment might include antibiotics for the infection, as well as antihistamines or decongestants.  REBOUND HEADACHES Q: What is a rebound headache? What causes it? How can I treat it? A: A pattern of taking acute headache medications too   often can lead to a condition known as "rebound headache." A pattern of taking too much headache medication includes taking it more than 2 days per week or in excessive amounts. That means more than the label or a caregiver advises.  With rebound headaches, your medications not only stop relieving pain, they actually begin to cause headaches. Doctors treat rebound headache by tapering the medication that is being overused. Sometimes your caregiver will gradually substitute a different type of treatment or medication. Stopping may be a challenge. Regularly overusing a medication increases the potential for serious side effects. Consult a caregiver if you regularly use headache medications more than 2 days per week or more than the label advises. ADDITIONAL QUESTIONS AND ANSWERS Q: What is biofeedback? A: Biofeedback is a self-help treatment. Biofeedback uses special equipment to monitor your body's involuntary physical responses. Biofeedback monitors:  Breathing.   Pulse.   Heart rate.   Temperature.   Muscle tension.   Brain activity.  Biofeedback helps you refine and perfect your relaxation exercises. You learn to control the physical responses that are related to stress. Once the technique has been mastered, you do not need the equipment any more. Q: Are headaches hereditary? A: Four out of five (80%) of people that suffer report a family history of migraine. Scientists are not sure if this is genetic or a family predisposition. Despite the uncertainty, a child has a 50% chance of having migraine if one parent suffers. The child has a 75% chance if both parents suffer.  Q: Can children get headaches? A: By the time they reach high school, most young people have experienced some type of headache. Many safe and effective approaches or medications can prevent a headache from occurring or stop it after it has begun.  Q: What type of doctor should I see to diagnose and treat my headache? A: Start with your primary caregiver. Discuss his or her experience and approach to headaches. Discuss methods of classification, diagnosis, and treatment. Your caregiver may decide to recommend you to a headache specialist, depending upon  your symptoms or other physical conditions. Having diabetes, allergies, etc., may require a more comprehensive and inclusive approach to your headache. The National Headache Foundation will provide, upon request, a list of NHF physician members in your state. Document Released: 08/28/2003 Document Revised: 05/27/2011 Document Reviewed: 02/05/2008 ExitCare Patient Information 2012 [employer].Headache, General, Unknown Cause The specific cause of your headache may not have been found today. There are many causes and types of headache. A few common ones are:  Tension headache.   Migraine.   Infections (examples: dental and sinus infections).   Bone and/or joint problems in the neck or jaw.   Depression.   Eye problems.  These headaches are not life threatening.  Headaches can sometimes be diagnosed by a patient history and a physical exam. Sometimes, lab and imaging studies (such as x-ray and/or CT scan) are used to rule out more serious problems. In some cases, a spinal tap (lumbar puncture) may be requested. There are many times when your exam and tests may be normal on the first visit even when there is a serious problem causing your headaches. Because of that, it is very important to follow up with your doctor or local clinic for further evaluation. FINDING OUT THE RESULTS OF TESTS  If a radiology test was performed, a radiologist will review your results.   You will be contacted by the emergency department or your physician if any test results   require a change in your treatment plan.   Not all test results may be available during your visit. If your test results are not back during the visit, make an appointment with your caregiver to find out the results. Do not assume everything is normal if you have not heard from your caregiver or the medical facility. It is important for you to follow up on all of your test results.  HOME CARE INSTRUCTIONS   Keep follow-up appointments with  your caregiver, or any specialist referral.   Only take over-the-counter or prescription medicines for pain, discomfort, or fever as directed by your caregiver.   Biofeedback, massage, or other relaxation techniques may be helpful.   Ice packs or heat applied to the head and neck can be used. Do this three to four times per day, or as needed.   Call your doctor if you have any questions or concerns.   If you smoke, you should quit.  SEEK MEDICAL CARE IF:   You develop problems with medications prescribed.   You do not respond to or obtain relief from medications.   You have a change from the usual headache.   You develop nausea or vomiting.  SEEK IMMEDIATE MEDICAL CARE IF:   If your headache becomes severe.   You have an unexplained oral temperature above 102 F (38.9 C), or as your caregiver suggests.   You have a stiff neck.   You have loss of vision.   You have muscular weakness.   You have loss of muscular control.   You develop severe symptoms different from your first symptoms.   You start losing your balance or have trouble walking.   You feel faint or pass out.  MAKE SURE YOU:   Understand these instructions.   Will watch your condition.   Will get help right away if you are not doing well or get worse.  Document Released: 06/07/2005 Document Revised: 05/27/2011 Document Reviewed: 01/25/2008 ExitCare Patient Information 2012 [employer].  RESOURCE GUIDE  Dental Problems  Patients with Medicaid: [facility]                     [facility] [address]                                           [address] Phone:  [phone]                                                  Phone:  [phone]  If unable to pay or uninsured, contact:  [facility] or [facility] to become qualified for the adult dental clinic.  Chronic Pain Problems Contact [facility]  297-2271 Patients need to  be referred by their primary care doctor.  Insufficient Money for Medicine Contact [employer]:  call "211" or [facility] 271-5999.  No Primary Care Doctor Call [facility]  832-8000 Other agencies that provide inexpensive medical care    [facility]  832-8035    [facility]  832-7272    [facility]  271-5999    Women's Clinic  832-4777    [facility]    [phone]    [facility]  272-1050  Psychological Services [facility]  832-9600 [facility]  [phone] [facility]   800 853-5163 (emergency services 641-4993)  Substance Abuse Resources [facility]  336-882-2125 [facility] 336-784-9470 [facility] 336-285-9073 [facility] 336-845-3988 Residential & Outpatient Substance Abuse Program  800-659-3381  Abuse/Neglect [place] Child Abuse Hotline (336) 641-3795 [place] Child Abuse Hotline 800-378-5315 (After Hours)  Emergency Shelter [facility] (336) 271-5985  [facility] (336) 275-9566 [facility] (704) 372-4663  MRSA Hotline #:   832-7006    [place] Resources  [facility] [place]     [employer]                          [facility] [address] [place]                       [address]      [address]  [place]                                                [place]                            [place] Phone:  [phone]                                   Phone:  [phone]                 Phone:  [phone]  [facility] Phone:  342-8316  [place] Child Abuse Hotline (336) 342-1394 (336) 342-3537 (After Hours)   

## 2011-08-23 NOTE — ED Notes (Signed)
Pt resting.  Respirations regular and even.  Reports improvement in pain level.

## 2011-08-23 NOTE — ED Provider Notes (Signed)
History    36 year old female with headache. Patient has a history of what she calls migraines and reports of her current headache feels similar in character to previous but somewhat stronger in intensity. Is current headache started about 2 days ago. Gradual onset. Denies history of trauma. Headache is diffuse and achy. No appreciable exacerbating relieving factors. Mild photophobia, otherwise no eye complaints. No numbness, tingling or loss of strength. No fevers or chills. No neck stiffness. Denies use of blood thinning medication. No vomiting. Patient has been taking ibuprofen at home without adequate relief. Patient is also prescribed Imitrex but is currently out. Is under the care of neurologist. Patient states that she is to see a new neurologist in [place] soon though for second opinion.  CSN: [id number]  Arrival date & time 08/23/11  0109   First MD Initiated Contact with Patient 08/23/11 0202      Chief Complaint  Patient presents with  . Migraine    (Consider location/radiation/quality/duration/timing/severity/associated sxs/prior treatment) HPI  Past Medical History  Diagnosis Date  . Migraine   . Nephrolithiasis   . Anxiety   . IBS (irritable bowel syndrome)   . S/P endoscopy April 2012    Dr. Jena Gauss: friability at GE junction, negative Barrett's orH.pylori, diffuse gastric petechia    Past Surgical History  Procedure Date  . Ileocolonoscopy 12/2007    Friable anal canal.    Family History  Problem Relation Age of Onset  . Irritable bowel syndrome Mother   . Colon cancer Neg Hx   . Inflammatory bowel disease Neg Hx   . Liver disease Neg Hx   . GI problems Sister     gastroparesis    History  Substance Use Topics  . Smoking status: Current Everyday Smoker -- 1.0 packs/day for 14 years    Types: Cigarettes  . Smokeless tobacco: Never Used  . Alcohol Use: No    OB History    Grav Para Term Preterm Abortions TAB SAB Ect Mult Living                   Review of Systems   Review of symptoms negative unless otherwise noted in HPI.   Allergies  Review of patient's allergies indicates no known allergies.  Home Medications   Current Outpatient Rx  Name Route Sig Dispense Refill  . IMITREX PO Oral Take by mouth.    . TOPIRAMATE 50 MG PO TABS Oral Take 50 mg by mouth 2 (two) times daily.        BP 116/70  Pulse 86  Temp 98.1 F (36.7 C)  Resp 18  Ht 5\' 4"  (1.626 m)  Wt 155 lb (70.308 kg)  BMI 26.61 kg/m2  SpO2 99%  LMP 08/21/2011  Physical Exam  Nursing note and vitals reviewed. Constitutional: She is oriented to person, place, and time. She appears well-developed and well-nourished. No distress.       Laying in bed. No acute distress.  HENT:  Head: Normocephalic and atraumatic.  Right Ear: External ear normal.  Left Ear: External ear normal.  Mouth/Throat: Oropharynx is clear and moist.  Eyes: Conjunctivae and EOM are normal. Pupils are equal, round, and reactive to light. Right eye exhibits no discharge. Left eye exhibits no discharge. No scleral icterus.  Neck: Normal range of motion. Neck supple.  Cardiovascular: Normal rate, regular rhythm and normal heart sounds.  Exam reveals no gallop and no friction rub.   No murmur heard. Pulmonary/Chest: Effort normal and breath sounds normal.  No respiratory distress.  Abdominal: Soft. She exhibits no distension. There is no tenderness.  Musculoskeletal: She exhibits no edema and no tenderness.  Lymphadenopathy:    She has no cervical adenopathy.  Neurological: She is alert and oriented to person, place, and time. No cranial nerve deficit. She exhibits normal muscle tone. Coordination normal.       Good finger to nose testing bilaterally.  Skin: Skin is warm and dry. She is not diaphoretic.  Psychiatric: She has a normal mood and affect. Her behavior is normal. Thought content normal.    ED Course  Procedures (including critical care time)  Labs Reviewed - No data to  display No results found.   1. Headache       MDM  36 year old female with headache. Suspect primary headache. Patient has history migraine headaches and says current one feel similar to prior. Consider secondary causes of headache such as infectious, bleed or mass but doubt. Consider carotid or vertebral artery dissection but doubt. Consider venous thrombosis but doubt. Consider ocular etiology such as acute angle closure glaucoma but doubt as well. Patient has no ocular complaints and has a normal eye examination. Patient is afebrile has no signs of meningismus. Chest no neurological complaints aside for headache and has nonfocal neurological examination. No contacts with similar complaints to suggest carbon monoxide poisoning. There is no history of trauma. Patient is not use blood thinning medication. Patient reports feeling better after medicines. She has no new complaints prior discharge. Return precautions were discussed. Outpatient followup as needed.        Raeford Razor, MD 08/23/11 [phone]

## 2011-08-23 NOTE — ED Notes (Signed)
Pt reports migraine headache that began yesterday and has increased.  Pt has been unable to obtain her Imitrex. Pt showing sensitivity to light and sound.  Room darkened and door closed.

## 2011-08-23 NOTE — ED Notes (Signed)
Pt reports she has a hx of migraines and has had a headache for the past couple of days

## 2011-11-05 ENCOUNTER — Encounter (HOSPITAL_COMMUNITY): Payer: Self-pay | Admitting: *Deleted

## 2011-11-05 ENCOUNTER — Emergency Department (HOSPITAL_COMMUNITY)
Admission: EM | Admit: 2011-11-05 | Discharge: 2011-11-05 | Disposition: A | Discharge status: Home / Self Care | Payer: 59 | Attending: Emergency Medicine | Admitting: Emergency Medicine

## 2011-11-05 DIAGNOSIS — F17200 Nicotine dependence, unspecified, uncomplicated: Secondary | ICD-10-CM | POA: Insufficient documentation

## 2011-11-05 DIAGNOSIS — R51 Headache: Secondary | ICD-10-CM | POA: Insufficient documentation

## 2011-11-05 DIAGNOSIS — F411 Generalized anxiety disorder: Secondary | ICD-10-CM | POA: Insufficient documentation

## 2011-11-05 DIAGNOSIS — Z79899 Other long term (current) drug therapy: Secondary | ICD-10-CM | POA: Insufficient documentation

## 2011-11-05 DIAGNOSIS — M542 Cervicalgia: Secondary | ICD-10-CM | POA: Insufficient documentation

## 2011-11-05 MED ORDER — NAPROXEN 500 MG PO TABS: 500.0000 mg | ORAL_TABLET | Freq: Two times a day (BID) | ORAL | Status: DC

## 2011-11-05 MED ORDER — CYCLOBENZAPRINE HCL 10 MG PO TABS: 10.0000 mg | ORAL_TABLET | Freq: Two times a day (BID) | ORAL | Status: AC | PRN

## 2011-11-05 MED ORDER — HYDROCODONE-ACETAMINOPHEN 5-325 MG PO TABS: 1.0000 | ORAL_TABLET | Freq: Four times a day (QID) | ORAL | Status: AC | PRN

## 2011-11-05 NOTE — ED Notes (Signed)
Pt states pain to back of neck, radiating up to back of head. Pt able to touch chin to chest without difficulty. Denies N/V.

## 2011-11-05 NOTE — ED Notes (Signed)
Pt states has ongoing headache x 2 months  Pain in occipital and base of skull into neck with tenderness to touch. Pt denies sinus infection s/s. Denies n/v and vision changes. Pt states Imitrex rx does not help with headache.

## 2011-11-05 NOTE — ED Notes (Signed)
MD at bedside. 

## 2011-11-05 NOTE — ED Provider Notes (Signed)
History  This chart was scribed for Shelda Jakes, MD by Stevphen Meuse. This patient was seen in room APFT22/APFT22 and the patient's care was started at.  CSN: [id number]  Arrival date & time 11/05/11  1101   First MD Initiated Contact with Patient 11/05/11 1106      Chief Complaint  Patient presents with  . Headache  . Neck Pain    (Consider location/radiation/quality/duration/timing/severity/associated sxs/prior treatment) Patient is a 36 y.o. female presenting with headaches and neck pain. The history is provided by the patient.  Headache  Pertinent negatives include no fever, no nausea and no vomiting.  Neck Pain  Associated symptoms include headaches. Pertinent negatives include no chest pain.   Brianna Davis is a 36 y.o. female who presents to the Emergency Department complaining of approximately 1 week of gradual onset, gradually worsening back of the neck pain that radiates to the back of her head. Pt states that she sometimes feels tingling in her left arm. She states that she is in pain that it hurts. Pt states that she has had intermittent HA for the past few months. Pt states that she ignores her pain but this time the pain was unbearable.  Pt states that she had an MRI of her head in the past 6 months. Pt states that she takes Advil to relieve her pain with moderate relief. Pt states that Pressure placed on he neck aggravates her pain. Pt denies fever, congestion, chest pain, nausea, dysuria, back pain, rash and bleeding easily as associated symptoms. Pt has a h/o migraine, nephrolithiasis and IBS. Pt is a current everyday smoker but denies a h/o alcohol use.  Pt PCP: Dr Gerda Diss   Past Medical History  Diagnosis Date  . Migraine   . Nephrolithiasis   . Anxiety   . IBS (irritable bowel syndrome)   . S/P endoscopy April 2012    Dr. Jena Gauss: friability at GE junction, negative Barrett's orH.pylori, diffuse gastric petechia    Past Surgical History  Procedure  Date  . Ileocolonoscopy 12/2007    Friable anal canal.    Family History  Problem Relation Age of Onset  . Irritable bowel syndrome Mother   . Colon cancer Neg Hx   . Inflammatory bowel disease Neg Hx   . Liver disease Neg Hx   . GI problems Sister     gastroparesis    History  Substance Use Topics  . Smoking status: Current Everyday Smoker -- 1.0 packs/day for 14 years    Types: Cigarettes  . Smokeless tobacco: Never Used  . Alcohol Use: No    OB History    Grav Para Term Preterm Abortions TAB SAB Ect Mult Living                  Review of Systems  Constitutional: Negative for fever.  HENT: Positive for neck pain. Negative for congestion and sore throat.   Respiratory: Negative for cough.   Cardiovascular: Negative for chest pain and leg swelling.  Gastrointestinal: Negative for nausea, vomiting and abdominal pain.  Genitourinary: Negative for dysuria.  Musculoskeletal: Negative for back pain and joint swelling.  Skin: Negative for rash.  Neurological: Positive for headaches.  Hematological: Does not bruise/bleed easily.    Allergies  Review of patient's allergies indicates no known allergies.  Home Medications   Current Outpatient Rx  Name Route Sig Dispense Refill  . CYCLOBENZAPRINE HCL 10 MG PO TABS Oral Take 1 tablet (10 mg total) by mouth 2 (two)  times daily as needed for muscle spasms. 20 tablet 0  . HYDROCODONE-ACETAMINOPHEN 5-325 MG PO TABS Oral Take 1-2 tablets by mouth every 6 (six) hours as needed for pain. 14 tablet 0  . NAPROXEN 500 MG PO TABS Oral Take 1 tablet (500 mg total) by mouth 2 (two) times daily. 14 tablet 0  . IMITREX PO Oral Take by mouth.    . TOPIRAMATE 50 MG PO TABS Oral Take 50 mg by mouth 2 (two) times daily.        Triage Vitals:BP 138/70  Pulse 103  Temp(Src) 98.1 F (36.7 C) (Oral)  Resp 20  Ht 5\' 4"  (1.626 m)  Wt 160 lb (72.576 kg)  BMI 27.46 kg/m2  SpO2 98%  LMP 10/10/2011  Physical Exam  Nursing note and vitals  reviewed. Constitutional: She is oriented to person, place, and time. She appears well-developed and well-nourished.  HENT:  Head: Normocephalic and atraumatic.       Mucus membranes moist  Eyes: Conjunctivae and EOM are normal.  Neck: Normal range of motion. Neck supple.  Cardiovascular: Normal rate and regular rhythm.   No murmur heard. Pulmonary/Chest: Effort normal and breath sounds normal.  Abdominal: Soft. Bowel sounds are normal. There is no tenderness.  Musculoskeletal: Normal range of motion. She exhibits tenderness (both Paraspinous cervical muscle ).  Lymphadenopathy:    She has no cervical adenopathy.  Neurological: She is alert and oriented to person, place, and time.  Psychiatric: She has a normal mood and affect. Her behavior is normal.    ED Course  Procedures (including critical care time) DIAGNOSTIC STUDIES: Oxygen Saturation is 98% on room air, normal by my interpretation.    COORDINATION OF CARE:  11:25AM referred pt to Dr Eduard Clos in [place]. Advised her that she needs a more in depth evaluation. Discussed administering stronger pain medication. Advised her that the more time she spends on her feet that the worse her might symptoms get.  Results for orders placed during the hospital encounter of 06/03/11  URINALYSIS, ROUTINE W REFLEX MICROSCOPIC      Component Value Range   Color, Urine YELLOW  YELLOW    APPearance HAZY (*) CLEAR    Specific Gravity, Urine 1.015  1.005 - 1.030    pH 6.5  5.0 - 8.0    Glucose, UA NEGATIVE  NEGATIVE (mg/dL)   Hgb urine dipstick NEGATIVE  NEGATIVE    Bilirubin Urine NEGATIVE  NEGATIVE    Ketones, ur NEGATIVE  NEGATIVE (mg/dL)   Protein, ur NEGATIVE  NEGATIVE (mg/dL)   Urobilinogen, UA 0.2  0.0 - 1.0 (mg/dL)   Nitrite NEGATIVE  NEGATIVE    Leukocytes, UA NEGATIVE  NEGATIVE   CBC      Component Value Range   WBC 6.1  4.0 - 10.5 (K/uL)   RBC 4.34  3.87 - 5.11 (MIL/uL)   Hemoglobin 14.0  12.0 - 15.0 (g/dL)   HCT 16.1   09.6 - 04.5 (%)   MCV 91.7  78.0 - 100.0 (fL)   MCH 32.3  26.0 - 34.0 (pg)   MCHC 35.2  30.0 - 36.0 (g/dL)   RDW 40.9  81.1 - 91.4 (%)   Platelets 322  150 - 400 (K/uL)  DIFFERENTIAL      Component Value Range   Neutrophils Relative 50  43 - 77 (%)   Neutro Abs 3.0  1.7 - 7.7 (K/uL)   Lymphocytes Relative 42  12 - 46 (%)   Lymphs Abs 2.6  0.7 -  4.0 (K/uL)   Monocytes Relative 4  3 - 12 (%)   Monocytes Absolute 0.3  0.1 - 1.0 (K/uL)   Eosinophils Relative 3  0 - 5 (%)   Eosinophils Absolute 0.2  0.0 - 0.7 (K/uL)   Basophils Relative 0  0 - 1 (%)   Basophils Absolute 0.0  0.0 - 0.1 (K/uL)  COMPREHENSIVE METABOLIC PANEL      Component Value Range   Sodium 139  135 - 145 (mEq/L)   Potassium 3.3 (*) 3.5 - 5.1 (mEq/L)   Chloride 106  96 - 112 (mEq/L)   CO2 25  19 - 32 (mEq/L)   Glucose, Bld 91  70 - 99 (mg/dL)   BUN 12  6 - 23 (mg/dL)   Creatinine, Ser 1.61  0.50 - 1.10 (mg/dL)   Calcium 9.5  8.4 - 09.6 (mg/dL)   Total Protein 7.5  6.0 - 8.3 (g/dL)   Albumin 4.0  3.5 - 5.2 (g/dL)   AST 12  0 - 37 (U/L)   ALT 10  0 - 35 (U/L)   Alkaline Phosphatase 78  39 - 117 (U/L)   Total Bilirubin 0.3  0.3 - 1.2 (mg/dL)   GFR calc non Af Amer >90  >90 (mL/min)   GFR calc Af Amer >90  >90 (mL/min)  LIPASE, BLOOD      Component Value Range   Lipase 33  11 - 59 (U/L)  URINE CULTURE      Component Value Range   Specimen Description URINE, CLEAN CATCH     Special Requests NONE     Culture  Setup Time [id number]     Colony Count NO GROWTH     Culture NO GROWTH     Report Status 06/04/2011 FINAL    PREGNANCY, URINE      Component Value Range   Preg Test, Ur NEGATIVE       Labs Reviewed - No data to display No results found.   1. Neck pain       MDM  Suspect symptoms are muscular in nature she has bilateral paraspinous neck soreness and tenderness. No radicular symptoms although does occasionally have some numbness intermittently to the lefthand. No fever no infectious type  symptoms do not feel that this is related to meningitis. Patient has had an MRI of her brain in the past also has had a history of migraine-like headaches this is not like that this seems to originate in the neck and radiate up into the occiput area. Again suspect muscular skeletal in nature will treat with Flexeril Naprosyn and hydrocodone. Referrals to the spine clinic and back to her primary care Dr. Patient will return for any new or worse symptoms particularly any fever or vomiting.     I personally performed the services described in this documentation, which was scribed in my presence. The recorded information has been reviewed and considered.     Shelda Jakes, MD 11/05/11 1140

## 2011-11-05 NOTE — Discharge Instructions (Signed)
Suspect a muscular neck pain. However if you start developing fevers any nausea or vomiting need to return right away. Take medicines as directed rest as much as possible for the next few days. Make an appointment to followup with your primary care Dr. Referral provided to the spine clinic if needed. If symptoms persist will need MRI.

## 2011-11-10 ENCOUNTER — Ambulatory Visit (HOSPITAL_COMMUNITY)
Admission: RE | Admit: 2011-11-10 | Discharge: 2011-11-10 | Disposition: A | Discharge status: Home / Self Care | Payer: 59 | Source: Ambulatory Visit | Attending: Family Medicine | Admitting: Family Medicine

## 2011-11-10 ENCOUNTER — Other Ambulatory Visit: Payer: Self-pay | Admitting: Family Medicine

## 2011-11-10 DIAGNOSIS — M542 Cervicalgia: Secondary | ICD-10-CM | POA: Insufficient documentation

## 2011-11-10 DIAGNOSIS — M549 Dorsalgia, unspecified: Secondary | ICD-10-CM

## 2012-07-24 ENCOUNTER — Encounter (HOSPITAL_COMMUNITY): Payer: Self-pay | Admitting: *Deleted

## 2012-07-24 ENCOUNTER — Emergency Department (HOSPITAL_COMMUNITY)
Admission: EM | Admit: 2012-07-24 | Discharge: 2012-07-24 | Disposition: A | Discharge status: Home / Self Care | Payer: 59 | Attending: Emergency Medicine | Admitting: Emergency Medicine

## 2012-07-24 DIAGNOSIS — R6883 Chills (without fever): Secondary | ICD-10-CM | POA: Insufficient documentation

## 2012-07-24 DIAGNOSIS — Z3202 Encounter for pregnancy test, result negative: Secondary | ICD-10-CM | POA: Insufficient documentation

## 2012-07-24 DIAGNOSIS — F17200 Nicotine dependence, unspecified, uncomplicated: Secondary | ICD-10-CM | POA: Insufficient documentation

## 2012-07-24 DIAGNOSIS — Z8659 Personal history of other mental and behavioral disorders: Secondary | ICD-10-CM | POA: Insufficient documentation

## 2012-07-24 DIAGNOSIS — Z87442 Personal history of urinary calculi: Secondary | ICD-10-CM | POA: Insufficient documentation

## 2012-07-24 DIAGNOSIS — R109 Unspecified abdominal pain: Secondary | ICD-10-CM | POA: Insufficient documentation

## 2012-07-24 DIAGNOSIS — R110 Nausea: Secondary | ICD-10-CM | POA: Insufficient documentation

## 2012-07-24 DIAGNOSIS — Z8679 Personal history of other diseases of the circulatory system: Secondary | ICD-10-CM | POA: Insufficient documentation

## 2012-07-24 DIAGNOSIS — Z8719 Personal history of other diseases of the digestive system: Secondary | ICD-10-CM | POA: Insufficient documentation

## 2012-07-24 DIAGNOSIS — Z9889 Other specified postprocedural states: Secondary | ICD-10-CM | POA: Insufficient documentation

## 2012-07-24 LAB — URINALYSIS, ROUTINE W REFLEX MICROSCOPIC
Bilirubin Urine: NEGATIVE
Hgb urine dipstick: NEGATIVE
Nitrite: NEGATIVE
Specific Gravity, Urine: 1.015 (ref 1.005–1.030)
Urobilinogen, UA: 0.2 mg/dL (ref 0.0–1.0)
pH: 6.5 (ref 5.0–8.0)

## 2012-07-24 LAB — POCT PREGNANCY, URINE: Preg Test, Ur: NEGATIVE

## 2012-07-24 MED ORDER — OXYCODONE-ACETAMINOPHEN 5-325 MG PO TABS: 1.0000 | ORAL_TABLET | ORAL | Status: DC | PRN

## 2012-07-24 NOTE — ED Provider Notes (Signed)
History  This chart was scribed for Brianna Gaskins, MD by Ardeen Jourdain, ED Scribe. This patient was seen in room APA11/APA11 and the patient's care was started at 1329.  CSN: [id number]  Arrival date & time 07/24/12  1208   First MD Initiated Contact with Patient 07/24/12 1329      Chief Complaint  Patient presents with  . Flank Pain     Patient is a 37 y.o. female presenting with flank pain. The history is provided by the patient. No language interpreter was used.  Flank Pain This is a new problem. The current episode started 3 to 5 hours ago. The problem occurs constantly. The problem has been gradually improving. Associated symptoms include abdominal pain. Pertinent negatives include no chest pain, no headaches and no shortness of breath. Nothing aggravates the symptoms. Nothing relieves the symptoms. She has tried a warm compress and rest for the symptoms.    Brianna Davis is a 37 y.o. female who presents to the Emergency Department complaining of sudden onset, intermittent, right flank pain with associated chills. She states she has a h/o kidney stones and states this pain feels similar. She denies any fever, emesis, diarrhea and dysuria as associated symptoms. She states the current pain has dissipated since arrival.     Past Medical History  Diagnosis Date  . Migraine   . Anxiety   . IBS (irritable bowel syndrome)   . S/P endoscopy April 2012    Dr. Jena Gauss: friability at GE junction, negative Barrett's orH.pylori, diffuse gastric petechia  . Nephrolithiasis     Past Surgical History  Procedure Date  . Ileocolonoscopy 12/2007    Friable anal canal.    Family History  Problem Relation Age of Onset  . Irritable bowel syndrome Mother   . Colon cancer Neg Hx   . Inflammatory bowel disease Neg Hx   . Liver disease Neg Hx   . GI problems Sister     gastroparesis    History  Substance Use Topics  . Smoking status: Current Every Day Smoker -- 1.0 packs/day for  14 years    Types: Cigarettes  . Smokeless tobacco: Never Used  . Alcohol Use: No    Review of Systems  Respiratory: Negative for shortness of breath.   Cardiovascular: Negative for chest pain.  Gastrointestinal: Positive for nausea and abdominal pain. Negative for vomiting.  Genitourinary: Positive for flank pain.  Neurological: Negative for headaches.  All other systems reviewed and are negative.    Allergies  Review of patient's allergies indicates no known allergies.  Home Medications   Current Outpatient Rx  Name  Route  Sig  Dispense  Refill  . IBUPROFEN 200 MG PO TABS   Oral   Take 800 mg by mouth every 6 (six) hours as needed. For headache           Triage Visit: BP 117/84  Pulse 89  Temp 97.9 F (36.6 C) (Oral)  Resp 20  Ht 5\' 6"  (1.676 m)  Wt 165 lb (74.844 kg)  BMI 26.63 kg/m2  SpO2 99%  LMP 06/21/2012  Physical Exam  CONSTITUTIONAL: Well developed/well nourished HEAD AND FACE: Normocephalic/atraumatic EYES: EOMI/PERRL ENMT: Mucous membranes moist NECK: supple no meningeal signs SPINE:entire spine nontender CV: S1/S2 noted, no murmurs/rubs/gallops noted LUNGS: Lungs are clear to auscultation bilaterally, no apparent distress ABDOMEN: soft, nontender, no rebound or guarding [id number] sided cva tenderness NEURO: Pt is awake/alert, moves all extremitiesx4 EXTREMITIES: pulses normal, full ROM SKIN: warm, color  normal PSYCH: no abnormalities of mood noted   ED Course  Procedures (including critical care time)  DIAGNOSTIC STUDIES: Oxygen Saturation is 99% on room air, normal by my interpretation.    COORDINATION OF CARE:  1:39 PM: Discussed treatment plan which includes UA and pregnancy test with pt at bedside and pt agreed to plan.   Pt was already improved on initial eval.  She is well appearing and is pain free.  She had no focal abdominal tenderness. She denied CP or SOB.  She would like to be discharged.     Labs Reviewed  URINALYSIS,  ROUTINE W REFLEX MICROSCOPIC     MDM  Nursing notes including past medical history and social history reviewed and considered in documentation Labs/vital reviewed and considered       I personally performed the services described in this documentation, which was scribed in my presence. The recorded information has been reviewed and is accurate.      Brianna Gaskins, MD 07/24/12 [phone]

## 2012-07-24 NOTE — ED Notes (Signed)
Rt flank pain, onset this am,  Nausea , no vomiting,  Hx of kidney stones

## 2012-09-11 ENCOUNTER — Encounter: Payer: Self-pay | Admitting: Nurse Practitioner

## 2012-09-11 ENCOUNTER — Ambulatory Visit (INDEPENDENT_AMBULATORY_CARE_PROVIDER_SITE_OTHER): Payer: 59 | Admitting: Nurse Practitioner

## 2012-09-11 VITALS — BP 130/70 | Temp 98.5°F | Wt 174.2 lb

## 2012-09-11 DIAGNOSIS — N10 Acute tubulo-interstitial nephritis: Secondary | ICD-10-CM

## 2012-09-11 DIAGNOSIS — R300 Dysuria: Secondary | ICD-10-CM

## 2012-09-11 LAB — POCT URINALYSIS DIPSTICK
Glucose, UA: NEGATIVE
Leukocytes, UA: NEGATIVE
pH, UA: 7

## 2012-09-11 LAB — POCT UA - MICROSCOPIC ONLY
RBC, urine, microscopic: 10
WBC, Ur, HPF, POC: 10

## 2012-09-11 MED ORDER — CEFPROZIL 500 MG PO TABS: 500.0000 mg | ORAL_TABLET | Freq: Two times a day (BID) | ORAL | Status: AC

## 2012-09-11 NOTE — Assessment & Plan Note (Signed)
1. Dysuria POCT UA - Microscopic Only - POCT urinalysis dipstick - cefPROZIL (CEFZIL) 500 MG tablet; Take 1 tablet (500 mg total) by mouth 2 (two) times daily.  Dispense: 14 tablet; Refill: 0  2. Acute pyelonephritis - cefPROZIL (CEFZIL) 500 MG tablet; Take 1 tablet (500 mg total) by mouth 2 (two) times daily.  Dispense: 14 tablet; Refill: 0 Warning signs reviewed including fever vomiting and worsening pain. Recheck in 48-72 hours if no improvement, call or go to ER sooner if worse.

## 2012-09-11 NOTE — Progress Notes (Signed)
Subjective: Presents complaints of urinary symptoms that began yesterday. Having frequency urgency and voiding small amounts. No fever but has had some chills. Burning and pain at the end of urination today. Small amount of blood one time when she wiped today. No vaginal discharge. Same sexual partner. Patient is actively trying to conceive. Also complaints of left midback pain going towards the flank area. Has a history of kidney stones. States this feels different. Not as intense. Objective: NAD. Alert, oriented. Lungs clear. Heart regular rate rhythm. Positive left CVA tenderness. No flank tenderness. Suprapubic tenderness noted on exam. Urine microscopic 10+ WBCs and 10+ RBCs per HPF. Occasional epithelial cell.

## 2012-09-18 ENCOUNTER — Telehealth: Payer: Self-pay | Admitting: Nurse Practitioner

## 2012-09-18 NOTE — Telephone Encounter (Signed)
Patient states the antibiotic she was put on last week has given her a yeast infection. Can you call her something in to [facility]?

## 2012-09-18 NOTE — Telephone Encounter (Signed)
Med called in and pt notified 

## 2012-09-18 NOTE — Telephone Encounter (Signed)
Diflucan 150 mg, # 1 take 1 po, may repeat in 1 week if needed 2 refills

## 2012-11-27 ENCOUNTER — Encounter: Payer: Self-pay | Admitting: Family Medicine

## 2012-11-27 ENCOUNTER — Ambulatory Visit (INDEPENDENT_AMBULATORY_CARE_PROVIDER_SITE_OTHER): Payer: 59 | Admitting: Family Medicine

## 2012-11-27 VITALS — BP 98/62 | Temp 98.4°F | Wt 176.2 lb

## 2012-11-27 DIAGNOSIS — R300 Dysuria: Secondary | ICD-10-CM

## 2012-11-27 LAB — POCT URINALYSIS DIPSTICK

## 2012-11-27 MED ORDER — CIPROFLOXACIN HCL 500 MG PO TABS: 500.0000 mg | ORAL_TABLET | Freq: Two times a day (BID) | ORAL | Status: AC

## 2012-11-27 MED ORDER — NITROFURANTOIN MONOHYD MACRO 100 MG PO CAPS: ORAL_CAPSULE | ORAL | Status: AC

## 2012-11-27 MED ORDER — FLUCONAZOLE 150 MG PO TABS: ORAL_TABLET | ORAL | Status: DC

## 2012-11-27 NOTE — Progress Notes (Signed)
  Subjective:    Patient ID: Brianna Davis, female    DOB: Apr 17, 1976, 37 y.o.   MRN: [id number]  Urinary Tract Infection  This is a new problem. The current episode started in the past 7 days. The problem has been gradually worsening. The quality of the pain is described as burning. Associated symptoms include chills, flank pain, frequency, hematuria and urgency. She has tried increased fluids and acetaminophen for the symptoms. Her past medical history is significant for kidney stones and recurrent UTIs.   Patient somewhat concerned about the frequency of recurring UTIs last one was 3 months ago. We talked about preventative measures including 4 ounces of cranberry juice daily and usual hygiene issues.   Review of Systems  Constitutional: Positive for chills.  Genitourinary: Positive for urgency, frequency, hematuria and flank pain.       Objective:   Physical Exam Lungs clear heart regular flanks are left side mild tenderness right side nontender abdomen soft no guarding rebound or discomfort.  Urinalysis on examination shows WBCs.       Assessment & Plan:  Urine culture ordered and Cipro 500 twice a day for 10-14 days then Macrodantin 100 mg one daily for 30 days, followup if frequent troubles. Referral to urology was offered. Patient defers currently. She has history kidney stones.

## 2012-11-29 LAB — URINE CULTURE: Colony Count: 30000

## 2012-12-13 ENCOUNTER — Encounter: Payer: Self-pay | Admitting: Family Medicine

## 2012-12-13 ENCOUNTER — Ambulatory Visit (INDEPENDENT_AMBULATORY_CARE_PROVIDER_SITE_OTHER): Payer: 59 | Admitting: Family Medicine

## 2012-12-13 ENCOUNTER — Ambulatory Visit (HOSPITAL_COMMUNITY)
Admission: RE | Admit: 2012-12-13 | Discharge: 2012-12-13 | Disposition: A | Discharge status: Home / Self Care | Payer: 59 | Source: Ambulatory Visit | Attending: Family Medicine | Admitting: Family Medicine

## 2012-12-13 VITALS — BP 112/74 | HR 80 | Ht 64.75 in | Wt 175.5 lb

## 2012-12-13 DIAGNOSIS — M546 Pain in thoracic spine: Secondary | ICD-10-CM

## 2012-12-13 DIAGNOSIS — M545 Low back pain, unspecified: Secondary | ICD-10-CM

## 2012-12-13 MED ORDER — MELOXICAM 15 MG PO TABS: 15.0000 mg | ORAL_TABLET | Freq: Every day | ORAL | Status: DC

## 2012-12-13 MED ORDER — METAXALONE 800 MG PO TABS: 800.0000 mg | ORAL_TABLET | Freq: Three times a day (TID) | ORAL | Status: DC

## 2012-12-13 NOTE — Progress Notes (Signed)
  Subjective:    Patient ID: Brianna Davis, female    DOB: 11-Aug-1975, 37 y.o.   MRN: [id number]  Back Pain This is a recurrent problem. The current episode started 1 to 4 weeks ago. The problem occurs constantly. The problem has been gradually worsening since onset. The pain is present in the gluteal and lumbar spine. The quality of the pain is described as burning. Radiates to: left leg. The pain is at a severity of 9/10. The pain is moderate. The pain is worse during the night. The symptoms are aggravated by lying down. Stiffness is present all day. Associated symptoms include leg pain. She has tried NSAIDs for the symptoms. The treatment provided no relief.   Patient relates she's not sure what triggered it's been going on for several weeks but she has had off and on problems with left leg burning and pain and discomfort for months consistent with sciatica  Patient states that time she wakes up at night with stiffness pain discomfort in her back.  Review of Systems  Musculoskeletal: Positive for back pain.   She denies fever sweats weight loss.    Objective:   Physical Exam Lower back in minimal tenderness to palpation mid back mild tenderness to palpation. Fair range of motion no increased pain with flexion or rotational Positive straight leg raise on the left negative on the right Can walk on her heels can walk on her toes Reflexes one plus. Neurosensory normal no edema      Assessment & Plan:  Lumbar and thoracic pain-musculoskeletal-stop other anti-inflammatories. Use Mobic 15 mg daily. Let us know if not improving over the next 6 weeks. If ongoing troubles she will need to have him MRI possibly. Also we talked about trying physical therapy if this stretches don't help her. Stretches were shown. Because the patient does have some pains that wake her up at night I recommend plain x-rays of the lumbar spine and thoracic spine. Also told the patient that her sciatica down the left  leg is consistent with a pinched nerve if he gets worse with time she will need MRI and referral.

## 2012-12-14 ENCOUNTER — Telehealth: Payer: Self-pay | Admitting: Gastroenterology

## 2012-12-14 ENCOUNTER — Telehealth: Payer: Self-pay | Admitting: Family Medicine

## 2012-12-14 ENCOUNTER — Ambulatory Visit (HOSPITAL_COMMUNITY)
Admission: RE | Admit: 2012-12-14 | Discharge: 2012-12-14 | Disposition: A | Discharge status: Home / Self Care | Payer: 59 | Source: Ambulatory Visit | Attending: Family Medicine | Admitting: Family Medicine

## 2012-12-14 ENCOUNTER — Ambulatory Visit: Payer: 59 | Admitting: Gastroenterology

## 2012-12-14 DIAGNOSIS — M545 Low back pain, unspecified: Secondary | ICD-10-CM | POA: Insufficient documentation

## 2012-12-14 DIAGNOSIS — M546 Pain in thoracic spine: Secondary | ICD-10-CM | POA: Insufficient documentation

## 2012-12-14 MED ORDER — CYCLOBENZAPRINE HCL 10 MG PO TABS: ORAL_TABLET | ORAL | Status: DC

## 2012-12-14 NOTE — Telephone Encounter (Signed)
Pt wants to know if the medication you prescribed yesterday could be supplemented for something else cheaper? It will cost her $85 for 90 pills (skelaxin) pt uses [facility]

## 2012-12-14 NOTE — Telephone Encounter (Signed)
Med sent to [facility]. Patient notified.

## 2012-12-14 NOTE — Telephone Encounter (Signed)
Pt was a no show

## 2012-12-14 NOTE — Telephone Encounter (Signed)
Try flexeril 10 mg, 1/2 to 1 qhs prn #30, 4 refills

## 2012-12-18 ENCOUNTER — Encounter: Payer: Self-pay | Admitting: *Deleted

## 2013-02-12 ENCOUNTER — Ambulatory Visit: Payer: 59 | Admitting: Family Medicine

## 2013-02-22 ENCOUNTER — Ambulatory Visit (INDEPENDENT_AMBULATORY_CARE_PROVIDER_SITE_OTHER): Payer: 59 | Admitting: Family Medicine

## 2013-02-22 ENCOUNTER — Encounter: Payer: Self-pay | Admitting: Family Medicine

## 2013-02-22 VITALS — BP 122/78 | Ht 63.0 in | Wt 173.0 lb

## 2013-02-22 DIAGNOSIS — M549 Dorsalgia, unspecified: Secondary | ICD-10-CM

## 2013-02-22 DIAGNOSIS — M2550 Pain in unspecified joint: Secondary | ICD-10-CM

## 2013-02-22 DIAGNOSIS — R109 Unspecified abdominal pain: Secondary | ICD-10-CM

## 2013-02-22 MED ORDER — ONDANSETRON HCL 4 MG PO TABS: 4.0000 mg | ORAL_TABLET | Freq: Every day | ORAL | Status: DC | PRN

## 2013-02-22 MED ORDER — HYOSCYAMINE SULFATE ER 0.375 MG PO TB12: 0.3750 mg | ORAL_TABLET | Freq: Two times a day (BID) | ORAL | Status: DC | PRN

## 2013-02-22 NOTE — Progress Notes (Signed)
  Subjective:    Patient ID: Brianna Davis, female    DOB: 1975/08/10, 37 y.o.   MRN: [id number]  Back Pain The current episode started more than 1 month ago. Episode frequency: worse at night. The problem has been gradually worsening since onset. The pain is present in the lumbar spine. The quality of the pain is described as aching and burning. The pain does not radiate (radiates into the mid back). Pain scale: 5 to 10. The pain is moderate. The symptoms are aggravated by lying down. Stiffness is present in the morning. Associated symptoms include abdominal pain. Pertinent negatives include no fever. She has tried muscle relaxant, NSAIDs and heat for the symptoms. The treatment provided no relief.  Abdominal Pain This is a chronic problem. The current episode started more than 1 month ago. The onset quality is gradual. The problem occurs 2 to 4 times per day. The problem has been gradually worsening. Pain location: all over. The pain is at a severity of 9/10. The pain is moderate. The quality of the pain is aching and colicky. Associated symptoms include diarrhea and nausea. Pertinent negatives include no fever or vomiting. The pain is relieved by nothing.   Patient wants to discuss quit smoking. Patient's back pain is been going on for a good 6-12 months. She's tried anti-inflammatory she's tried stretching she's tried muscle relaxers nothing is seen to help it is progressively got worse wakes her up every single night. She also has pain down the right leg that's been present for months. It's getting worse. She states she has a difficult time standing it. She would like to go ahead with more definitive testing.  She's also had extensive treatment with gastroenterology they did endoscopy colonoscopy they told her she did not have Crohn's or ulcerative colitis they told her that she did in fact have irritable bowel they recommended a sublingual tablet but nothing else for it. Not nothing really  seemed to have helped.   Review of Systems  Constitutional: Negative for fever, activity change, appetite change and fatigue.  HENT: Negative for congestion and rhinorrhea.   Gastrointestinal: Positive for nausea, abdominal pain and diarrhea. Negative for vomiting.  Musculoskeletal: Positive for back pain.       Objective:   Physical Exam Lungs are clear heart is regular abdomen is soft generalized abdominal discomfort low back moderate tenderness positive straight leg raise on the right patient has difficult time with mobility left leg normal extremities no edema skin warm dry neck no masses       Assessment & Plan:  #1 abdominal discomfort/curable bowel syndrome-Levbid one twice daily followup in 4 weeks see how this does. Also celiac antibodies. Await the results. May need referral back to gastroenterology no need for CT scans. #2 lumbar pain discomfort-she has sciatica goes down her right leg she has tried conservative measures she has failed anti-inflammatories as well as other conservative measures. Patient would benefit at this point in time to go ahead and be seen by radiology for MRI. I believe this patient would benefit to have this test done in the near future she may end up needing to have an injection or referral to neurosurgery

## 2013-02-28 ENCOUNTER — Ambulatory Visit (HOSPITAL_COMMUNITY)
Admission: RE | Admit: 2013-02-28 | Discharge: 2013-02-28 | Disposition: A | Discharge status: Home / Self Care | Payer: 59 | Source: Ambulatory Visit | Attending: Family Medicine | Admitting: Family Medicine

## 2013-02-28 DIAGNOSIS — M545 Low back pain, unspecified: Secondary | ICD-10-CM | POA: Insufficient documentation

## 2013-02-28 DIAGNOSIS — M51379 Other intervertebral disc degeneration, lumbosacral region without mention of lumbar back pain or lower extremity pain: Secondary | ICD-10-CM | POA: Insufficient documentation

## 2013-02-28 DIAGNOSIS — M5137 Other intervertebral disc degeneration, lumbosacral region: Secondary | ICD-10-CM | POA: Insufficient documentation

## 2013-02-28 DIAGNOSIS — M5126 Other intervertebral disc displacement, lumbar region: Secondary | ICD-10-CM | POA: Insufficient documentation

## 2013-03-02 ENCOUNTER — Telehealth: Payer: Self-pay | Admitting: Family Medicine

## 2013-03-02 NOTE — Telephone Encounter (Signed)
Patient would like results from the MRI she has completed.  She has to work this afternoon will leave home at 1pm.  States she was wanting to get these results for a Doctor's appointment on Monday.  Please call Patient. Thanks

## 2013-03-12 ENCOUNTER — Ambulatory Visit (INDEPENDENT_AMBULATORY_CARE_PROVIDER_SITE_OTHER): Payer: 59 | Admitting: Family Medicine

## 2013-03-12 ENCOUNTER — Encounter: Payer: Self-pay | Admitting: Family Medicine

## 2013-03-12 VITALS — BP 114/74 | Temp 98.4°F | Ht 64.0 in | Wt 174.6 lb

## 2013-03-12 DIAGNOSIS — J019 Acute sinusitis, unspecified: Secondary | ICD-10-CM

## 2013-03-12 DIAGNOSIS — M545 Low back pain: Secondary | ICD-10-CM

## 2013-03-12 MED ORDER — LEVOFLOXACIN 500 MG PO TABS: 500.0000 mg | ORAL_TABLET | Freq: Every day | ORAL | Status: DC

## 2013-03-12 MED ORDER — HYDROCODONE-HOMATROPINE 5-1.5 MG/5ML PO SYRP: 5.0000 mL | ORAL_SOLUTION | Freq: Four times a day (QID) | ORAL | Status: DC | PRN

## 2013-03-12 NOTE — Progress Notes (Signed)
  Subjective:    Patient ID: Brianna Davis, female    DOB: 1976/02/23, 37 y.o.   MRN: [id number]  Sore Throat  This is a new problem. The current episode started yesterday. The problem has been gradually worsening. There has been no fever. Associated symptoms include congestion, coughing, headaches and a plugged ear sensation. She has tried acetaminophen for the symptoms. The treatment provided no relief.   PMH benign she does have some endometriosis for which she is going to be having surgery in the near future she also has abnormal MRI she was offered to see a specialist she defers currently.   Review of Systems  HENT: Positive for congestion.   Respiratory: Positive for cough.   Neurological: Positive for headaches.   Patient complains of moderate postnasal drip as well as sinus pressure and pain    Objective:   Physical Exam  Lungs clear hearts regular throat nonerythematous neck supple mild sinus tenderness     Assessment & Plan:  Acute sinusitis-antibiotics prescribed followup if ongoing trouble  Low back pain abnormal MRI if ongoing troubles notify us we will help set her up with specialist either Dr. Ethelene Hal for Dr. Noel Gerold

## 2013-03-16 ENCOUNTER — Encounter (HOSPITAL_COMMUNITY): Payer: Self-pay | Admitting: Pharmacist

## 2013-03-26 ENCOUNTER — Other Ambulatory Visit: Payer: Self-pay | Admitting: Obstetrics & Gynecology

## 2013-03-28 ENCOUNTER — Encounter (HOSPITAL_COMMUNITY): Payer: Self-pay

## 2013-03-28 ENCOUNTER — Encounter (HOSPITAL_COMMUNITY)
Admission: RE | Admit: 2013-03-28 | Discharge: 2013-03-28 | Disposition: A | Discharge status: Home / Self Care | Payer: 59 | Source: Ambulatory Visit | Attending: Obstetrics & Gynecology | Admitting: Obstetrics & Gynecology

## 2013-03-28 DIAGNOSIS — Z01812 Encounter for preprocedural laboratory examination: Secondary | ICD-10-CM | POA: Insufficient documentation

## 2013-03-28 HISTORY — DX: Reserved for concepts with insufficient information to code with codable children: IMO0002

## 2013-03-28 LAB — CBC
HCT: 38.8 % (ref 36.0–46.0)
Hemoglobin: 13.7 g/dL (ref 12.0–15.0)
MCHC: 35.3 g/dL (ref 30.0–36.0)
MCV: 89 fL (ref 78.0–100.0)
RDW: 12.3 % (ref 11.5–15.5)

## 2013-03-28 NOTE — Patient Instructions (Signed)
20 Brianna Davis  03/28/2013   Your procedure is scheduled on:  04/03/13  Enter through the Main Entrance of [facility] at 6 AM.  Pick up the phone at the desk and dial [phone].   Call this number if you have problems the morning of surgery: [phone]   Remember:   Do not eat food:After Midnight.  Do not drink clear liquids: After Midnight.  Take these medicines the morning of surgery with A SIP OF WATER: NA   Do not wear jewelry, make-up or nail polish.  Do not wear lotions, powders, or perfumes. You may wear deodorant.  Do not shave 48 hours prior to surgery.  Do not bring valuables to the hospital.  [facility] is not   responsible for any belongings or valuables brought to the hospital.  Contacts, dentures or bridgework may not be worn into surgery.  Leave suitcase in the car. After surgery it may be brought to your room.  For patients admitted to the hospital, checkout time is 11:00 AM the day of              discharge.   Patients discharged the day of surgery will not be allowed to drive             home.  Name and phone number of your driver: Husband  [name]  Special Instructions:   Shower using CHG 2 nights before surgery and the night before surgery.  If you shower the day of surgery use CHG.  Use special wash - you have one bottle of CHG for all showers.  You should use approximately 1/3 of the bottle for each shower.   Please read over the following fact sheets that you were given:   Surgical Site Infection Prevention

## 2013-04-03 ENCOUNTER — Ambulatory Visit (HOSPITAL_COMMUNITY)
Admission: RE | Admit: 2013-04-03 | Discharge: 2013-04-03 | Disposition: A | Discharge status: Home / Self Care | Payer: 59 | Source: Ambulatory Visit | Attending: Obstetrics & Gynecology | Admitting: Obstetrics & Gynecology

## 2013-04-03 ENCOUNTER — Encounter (HOSPITAL_COMMUNITY): Payer: 59 | Admitting: Certified Registered Nurse Anesthetist

## 2013-04-03 ENCOUNTER — Encounter (HOSPITAL_COMMUNITY): Payer: Self-pay | Admitting: Anesthesiology

## 2013-04-03 ENCOUNTER — Encounter (HOSPITAL_COMMUNITY)
Admission: RE | Disposition: A | Discharge status: Home / Self Care | Payer: Self-pay | Source: Ambulatory Visit | Attending: Obstetrics & Gynecology

## 2013-04-03 ENCOUNTER — Ambulatory Visit (HOSPITAL_COMMUNITY): Payer: 59 | Admitting: Certified Registered Nurse Anesthetist

## 2013-04-03 DIAGNOSIS — IMO0002 Reserved for concepts with insufficient information to code with codable children: Secondary | ICD-10-CM | POA: Insufficient documentation

## 2013-04-03 DIAGNOSIS — N946 Dysmenorrhea, unspecified: Secondary | ICD-10-CM | POA: Insufficient documentation

## 2013-04-03 DIAGNOSIS — N838 Other noninflammatory disorders of ovary, fallopian tube and broad ligament: Secondary | ICD-10-CM | POA: Insufficient documentation

## 2013-04-03 DIAGNOSIS — N949 Unspecified condition associated with female genital organs and menstrual cycle: Secondary | ICD-10-CM | POA: Insufficient documentation

## 2013-04-03 DIAGNOSIS — N803 Endometriosis of pelvic peritoneum, unspecified: Secondary | ICD-10-CM | POA: Insufficient documentation

## 2013-04-03 DIAGNOSIS — N971 Female infertility of tubal origin: Secondary | ICD-10-CM | POA: Insufficient documentation

## 2013-04-03 HISTORY — PX: LAPAROSCOPY: SHX197

## 2013-04-03 HISTORY — PX: CHROMOPERTUBATION: SHX6288

## 2013-04-03 HISTORY — PX: ROBOTIC ASSISTED LAPAROSCOPIC LYSIS OF ADHESION: SHX6080

## 2013-04-03 LAB — PREGNANCY, URINE: Preg Test, Ur: NEGATIVE

## 2013-04-03 SURGERY — LAPAROSCOPY, DIAGNOSTIC
Anesthesia: General | Site: Vagina | Wound class: Clean Contaminated

## 2013-04-03 MED ORDER — KETOROLAC TROMETHAMINE 30 MG/ML IJ SOLN
INTRAMUSCULAR | Status: AC
  Filled 2013-04-03: qty 1

## 2013-04-03 MED ORDER — ARTIFICIAL TEARS OP OINT
TOPICAL_OINTMENT | OPHTHALMIC | Status: DC | PRN
  Administered 2013-04-03: 1 via OPHTHALMIC

## 2013-04-03 MED ORDER — METHYLENE BLUE 1 % INJ SOLN
INTRAMUSCULAR | Status: DC | PRN
  Administered 2013-04-03: 1 mL

## 2013-04-03 MED ORDER — NEOSTIGMINE METHYLSULFATE 1 MG/ML IJ SOLN
INTRAMUSCULAR | Status: DC | PRN
  Administered 2013-04-03: 3 mg via INTRAVENOUS

## 2013-04-03 MED ORDER — PROPOFOL 10 MG/ML IV EMUL
INTRAVENOUS | Status: AC
  Filled 2013-04-03: qty 20

## 2013-04-03 MED ORDER — BUPIVACAINE HCL (PF) 0.25 % IJ SOLN
INTRAMUSCULAR | Status: DC | PRN
  Administered 2013-04-03: 17 mL

## 2013-04-03 MED ORDER — FENTANYL CITRATE 0.05 MG/ML IJ SOLN
25.0000 ug | INTRAMUSCULAR | Status: DC | PRN
  Administered 2013-04-03 (×3): 50 ug via INTRAVENOUS

## 2013-04-03 MED ORDER — OXYCODONE-ACETAMINOPHEN 5-325 MG PO TABS
1.0000 | ORAL_TABLET | ORAL | Status: DC | PRN
  Administered 2013-04-03: 1 via ORAL

## 2013-04-03 MED ORDER — LIDOCAINE HCL (CARDIAC) 20 MG/ML IV SOLN
INTRAVENOUS | Status: DC | PRN
  Administered 2013-04-03: 80 mg via INTRAVENOUS

## 2013-04-03 MED ORDER — BUPIVACAINE HCL (PF) 0.25 % IJ SOLN
INTRAMUSCULAR | Status: AC
  Filled 2013-04-03: qty 60

## 2013-04-03 MED ORDER — SUFENTANIL CITRATE 50 MCG/ML IV SOLN
INTRAVENOUS | Status: AC
  Filled 2013-04-03: qty 1

## 2013-04-03 MED ORDER — OXYCODONE-ACETAMINOPHEN 5-325 MG PO TABS
ORAL_TABLET | ORAL | Status: AC
  Filled 2013-04-03: qty 1

## 2013-04-03 MED ORDER — MEPERIDINE HCL 25 MG/ML IJ SOLN: 6.2500 mg | INTRAMUSCULAR | Status: DC | PRN

## 2013-04-03 MED ORDER — FENTANYL CITRATE 0.05 MG/ML IJ SOLN
INTRAMUSCULAR | Status: AC
  Administered 2013-04-03: 50 ug via INTRAVENOUS
  Filled 2013-04-03: qty 2

## 2013-04-03 MED ORDER — LACTATED RINGERS IR SOLN
Status: DC | PRN
  Administered 2013-04-03: 3000 mL

## 2013-04-03 MED ORDER — LACTATED RINGERS IV SOLN
INTRAVENOUS | Status: DC
Start: 2013-04-03 — End: 2013-04-03
  Administered 2013-04-03 (×2): via INTRAVENOUS

## 2013-04-03 MED ORDER — KETOROLAC TROMETHAMINE 30 MG/ML IJ SOLN: 15.0000 mg | Freq: Once | INTRAMUSCULAR | Status: DC | PRN

## 2013-04-03 MED ORDER — SUFENTANIL CITRATE 50 MCG/ML IV SOLN
INTRAVENOUS | Status: DC | PRN
  Administered 2013-04-03 (×5): 10 ug via INTRAVENOUS

## 2013-04-03 MED ORDER — DEXAMETHASONE SODIUM PHOSPHATE 10 MG/ML IJ SOLN
INTRAMUSCULAR | Status: DC | PRN
  Administered 2013-04-03: 10 mg via INTRAVENOUS

## 2013-04-03 MED ORDER — ONDANSETRON HCL 4 MG/2ML IJ SOLN
INTRAMUSCULAR | Status: AC
  Filled 2013-04-03: qty 2

## 2013-04-03 MED ORDER — MIDAZOLAM HCL 2 MG/2ML IJ SOLN
INTRAMUSCULAR | Status: DC | PRN
  Administered 2013-04-03: 0.5 mg via INTRAVENOUS
  Administered 2013-04-03: 1.5 mg via INTRAVENOUS

## 2013-04-03 MED ORDER — CEFAZOLIN SODIUM-DEXTROSE 2-3 GM-% IV SOLR
2.0000 g | INTRAVENOUS | Status: AC
  Administered 2013-04-03: 2 g via INTRAVENOUS

## 2013-04-03 MED ORDER — ONDANSETRON HCL 4 MG/2ML IJ SOLN
INTRAMUSCULAR | Status: DC | PRN
  Administered 2013-04-03 (×2): 2 mg via INTRAMUSCULAR

## 2013-04-03 MED ORDER — GLYCOPYRROLATE 0.2 MG/ML IJ SOLN
INTRAMUSCULAR | Status: AC
  Filled 2013-04-03: qty 3

## 2013-04-03 MED ORDER — ONDANSETRON HCL 4 MG/2ML IJ SOLN: 4.0000 mg | Freq: Once | INTRAMUSCULAR | Status: DC | PRN

## 2013-04-03 MED ORDER — ARTIFICIAL TEARS OP OINT
TOPICAL_OINTMENT | OPHTHALMIC | Status: AC
  Filled 2013-04-03: qty 3.5

## 2013-04-03 MED ORDER — MIDAZOLAM HCL 2 MG/2ML IJ SOLN
INTRAMUSCULAR | Status: AC
  Filled 2013-04-03: qty 2

## 2013-04-03 MED ORDER — HEPARIN SODIUM (PORCINE) 5000 UNIT/ML IJ SOLN
INTRAMUSCULAR | Status: AC
  Filled 2013-04-03: qty 1

## 2013-04-03 MED ORDER — OXYCODONE-ACETAMINOPHEN 7.5-325 MG PO TABS: 1.0000 | ORAL_TABLET | ORAL | Status: DC | PRN

## 2013-04-03 MED ORDER — NEOSTIGMINE METHYLSULFATE 1 MG/ML IJ SOLN
INTRAMUSCULAR | Status: AC
  Filled 2013-04-03: qty 1

## 2013-04-03 MED ORDER — DEXAMETHASONE SODIUM PHOSPHATE 10 MG/ML IJ SOLN
INTRAMUSCULAR | Status: AC
  Filled 2013-04-03: qty 1

## 2013-04-03 MED ORDER — LIDOCAINE HCL (CARDIAC) 20 MG/ML IV SOLN
INTRAVENOUS | Status: AC
  Filled 2013-04-03: qty 5

## 2013-04-03 MED ORDER — GLYCOPYRROLATE 0.2 MG/ML IJ SOLN
INTRAMUSCULAR | Status: DC | PRN
  Administered 2013-04-03 (×2): .1 mg via INTRAVENOUS
  Administered 2013-04-03: 0.4 mg via INTRAVENOUS

## 2013-04-03 MED ORDER — ROCURONIUM BROMIDE 100 MG/10ML IV SOLN
INTRAVENOUS | Status: DC | PRN
  Administered 2013-04-03: 50 mg via INTRAVENOUS

## 2013-04-03 MED ORDER — PROPOFOL 10 MG/ML IV BOLUS
INTRAVENOUS | Status: DC | PRN
  Administered 2013-04-03: 200 mg via INTRAVENOUS

## 2013-04-03 MED ORDER — CEFAZOLIN SODIUM-DEXTROSE 2-3 GM-% IV SOLR
INTRAVENOUS | Status: AC
  Filled 2013-04-03: qty 50

## 2013-04-03 MED ORDER — ROCURONIUM BROMIDE 50 MG/5ML IV SOLN
INTRAVENOUS | Status: AC
  Filled 2013-04-03: qty 1

## 2013-04-03 MED ORDER — KETOROLAC TROMETHAMINE 30 MG/ML IJ SOLN
INTRAMUSCULAR | Status: DC | PRN
  Administered 2013-04-03: 30 mg via INTRAVENOUS

## 2013-04-03 SURGICAL SUPPLY — 74 items
APPLICATOR COTTON TIP 6IN STRL (MISCELLANEOUS) ×3 IMPLANT
BARRIER ADHS 3X4 INTERCEED (GAUZE/BANDAGES/DRESSINGS) ×3 IMPLANT
CABLE HIGH FREQUENCY MONO STRZ (ELECTRODE) ×3 IMPLANT
CATH FOLEY 3WAY 30CC 16FR (CATHETERS) ×3 IMPLANT
CATH ROBINSON RED A/P 16FR (CATHETERS) ×3 IMPLANT
CHLORAPREP W/TINT 26ML (MISCELLANEOUS) ×3 IMPLANT
CLOTH BEACON ORANGE TIMEOUT ST (SAFETY) ×3 IMPLANT
CONT PATH 16OZ SNAP LID 3702 (MISCELLANEOUS) ×3 IMPLANT
COVER MAYO STAND STRL (DRAPES) ×3 IMPLANT
COVER TABLE BACK 60X90 (DRAPES) ×6 IMPLANT
COVER TIP SHEARS 8 DVNC (MISCELLANEOUS) ×2 IMPLANT
COVER TIP SHEARS 8MM DA VINCI (MISCELLANEOUS) ×1
DECANTER SPIKE VIAL GLASS SM (MISCELLANEOUS) ×3 IMPLANT
DERMABOND ADVANCED (GAUZE/BANDAGES/DRESSINGS) ×1
DERMABOND ADVANCED .7 DNX12 (GAUZE/BANDAGES/DRESSINGS) ×2 IMPLANT
DRAPE HUG U DISPOSABLE (DRAPE) ×3 IMPLANT
DRAPE LG THREE QUARTER DISP (DRAPES) ×6 IMPLANT
DRAPE WARM FLUID 44X44 (DRAPE) ×3 IMPLANT
ELECT REM PT RETURN 9FT ADLT (ELECTROSURGICAL) ×3
ELECTRODE REM PT RTRN 9FT ADLT (ELECTROSURGICAL) ×2 IMPLANT
EVACUATOR SMOKE 8.L (FILTER) ×3 IMPLANT
GAUZE VASELINE 3X9 (GAUZE/BANDAGES/DRESSINGS) IMPLANT
GLOVE BIO SURGEON STRL SZ 6.5 (GLOVE) ×3 IMPLANT
GLOVE BIOGEL PI IND STRL 7.0 (GLOVE) ×2 IMPLANT
GLOVE BIOGEL PI INDICATOR 7.0 (GLOVE) ×1
GOWN PREVENTION PLUS LG XLONG (DISPOSABLE) ×9 IMPLANT
GOWN STRL REIN XL XLG (GOWN DISPOSABLE) ×18 IMPLANT
IV STOPCOCK 4 WAY 40  W/Y SET (IV SOLUTION) ×2
IV STOPCOCK 4 WAY 40 W/Y SET (IV SOLUTION) ×4 IMPLANT
KIT ACCESSORY DA VINCI DISP (KITS) ×1
KIT ACCESSORY DVNC DISP (KITS) ×2 IMPLANT
LEGGING LITHOTOMY PAIR STRL (DRAPES) ×3 IMPLANT
MANIPULATOR UTERINE 4.5 ZUMI (MISCELLANEOUS) IMPLANT
NEEDLE HYPO 22GX1.5 SAFETY (NEEDLE) IMPLANT
OCCLUDER COLPOPNEUMO (BALLOONS) ×3 IMPLANT
PACK LAPAROSCOPY BASIN (CUSTOM PROCEDURE TRAY) ×3 IMPLANT
PACK LAVH (CUSTOM PROCEDURE TRAY) ×3 IMPLANT
PAD PREP 24X48 CUFFED NSTRL (MISCELLANEOUS) ×6 IMPLANT
PENCIL BUTTON HOLSTER BLD 10FT (ELECTRODE) ×3 IMPLANT
PROTECTOR NERVE ULNAR (MISCELLANEOUS) ×6 IMPLANT
SET CYSTO W/LG BORE CLAMP LF (SET/KITS/TRAYS/PACK) IMPLANT
SET IRRIG TUBING LAPAROSCOPIC (IRRIGATION / IRRIGATOR) ×6 IMPLANT
SOLUTION ELECTROLUBE (MISCELLANEOUS) ×3 IMPLANT
SUT MNCRL AB 4-0 PS2 18 (SUTURE) ×6 IMPLANT
SUT VIC AB 0 CT1 27 (SUTURE)
SUT VIC AB 0 CT1 27XBRD ANTBC (SUTURE) IMPLANT
SUT VIC AB 0 CT2 27 (SUTURE) IMPLANT
SUT VIC AB 2-0 CT1 27 (SUTURE)
SUT VIC AB 2-0 CT1 TAPERPNT 27 (SUTURE) IMPLANT
SUT VIC AB 3-0 SH 27 (SUTURE)
SUT VIC AB 3-0 SH 27X BRD (SUTURE) IMPLANT
SUT VIC AB 4-0 PS2 27 (SUTURE) ×6 IMPLANT
SUT VICRYL 0 UR6 27IN ABS (SUTURE) ×6 IMPLANT
SYR 50ML LL SCALE MARK (SYRINGE) ×3 IMPLANT
SYSTEM CONVERTIBLE TROCAR (TROCAR) IMPLANT
TIP UTERINE 5.1X6CM LAV DISP (MISCELLANEOUS) ×3 IMPLANT
TIP UTERINE 6.7X10CM GRN DISP (MISCELLANEOUS) IMPLANT
TIP UTERINE 6.7X6CM WHT DISP (MISCELLANEOUS) IMPLANT
TIP UTERINE 6.7X8CM BLUE DISP (MISCELLANEOUS) ×3 IMPLANT
TOWEL OR 17X24 6PK STRL BLUE (TOWEL DISPOSABLE) ×9 IMPLANT
TRAY FOLEY BAG SILVER LF 14FR (CATHETERS) ×3 IMPLANT
TRAY FOLEY CATH 14FR (SET/KITS/TRAYS/PACK) ×3 IMPLANT
TROCAR 12M 150ML BLUNT (TROCAR) IMPLANT
TROCAR BALLN 12MMX100 BLUNT (TROCAR) ×3 IMPLANT
TROCAR DISP BLADELESS 8 DVNC (TROCAR) ×2 IMPLANT
TROCAR DISP BLADELESS 8MM (TROCAR) ×1
TROCAR OPTI TIP 12M 100M (ENDOMECHANICALS) IMPLANT
TROCAR XCEL 12X100 BLDLESS (ENDOMECHANICALS) ×3 IMPLANT
TROCAR XCEL NON-BLD 11X100MML (ENDOMECHANICALS) IMPLANT
TROCAR XCEL NON-BLD 5MMX100MML (ENDOMECHANICALS) ×3 IMPLANT
TROCAR XCEL OPT SLVE 5M 100M (ENDOMECHANICALS) IMPLANT
TUBING FILTER THERMOFLATOR (ELECTROSURGICAL) ×6 IMPLANT
WARMER LAPAROSCOPE (MISCELLANEOUS) ×3 IMPLANT
WATER STERILE IRR 1000ML POUR (IV SOLUTION) ×9 IMPLANT

## 2013-04-03 NOTE — Discharge Summary (Signed)
  Physician Discharge Summary  Patient ID: ANAELLE DUNTON MRN: [id number] DOB/AGE: 37-Jun-1977 37 y.o.  Admit date: 04/03/2013 Discharge date: 04/03/2013  Admission Diagnoses: Pelvic Pain, Dysmenorrhea, Dyspareunia  75643, [id number]  Discharge Diagnoses: Pelvic Pain, Dysmenorrhea, Dyspareunia  32951, 58350  Mild pelvic endometriosis, patent right tube, proximal obstruction left tube.        Active Problems:   * No active hospital problems. *   Discharged Condition: good  Hospital Course: Outpatient  Consults: None  Treatments: surgery: Dx Laparoscopy followed by robotic assisted laparoscopic biopsies and cautherization of endometriosis, removal of a left paratubal cyst and Methylene Blue Chromopertubation.  Disposition: 01-Home or Self Care     Medication List         ibuprofen 200 MG tablet  Commonly known as:  ADVIL,MOTRIN  Take 600-800 mg by mouth every 6 (six) hours as needed for pain.     oxyCODONE-acetaminophen 7.5-325 MG per tablet  Commonly known as:  PERCOCET  Take 1 tablet by mouth every 4 (four) hours as needed for pain.           Follow-up Information   Follow up with Brysin Towery,MARIE-LYNE, MD In 3 weeks.   Specialty:  Obstetrics and Gynecology   Contact information:   [address] [place] [address] [phone]       Signed: Genia Del, MD 04/03/2013, 9:10 AM

## 2013-04-03 NOTE — Anesthesia Procedure Notes (Signed)
Procedure Name: Intubation Date/Time: 04/03/2013 7:30 AM Performed by: Kashvi Prevette ADEDAYO Sebastyan Snodgrass Pre-anesthesia Checklist: Patient identified, Emergency Drugs available, Suction available, Patient being monitored and Timeout performed Patient Re-evaluated:Patient Re-evaluated prior to inductionOxygen Delivery Method: Circle system utilized Preoxygenation: Pre-oxygenation with 100% oxygen Intubation Type: IV induction Ventilation: Mask ventilation without difficulty Laryngoscope Size: Miller and 2 Grade View: Grade I Tube type: Oral Tube size: 6.0 mm Number of attempts: 1 Dental Injury: Teeth and Oropharynx as per pre-operative assessment

## 2013-04-03 NOTE — Op Note (Signed)
04/03/2013  8:50 AM  PATIENT:  Brianna Davis  37 y.o. female  PRE-OPERATIVE DIAGNOSIS:  Pelvic Pain, Dysmenorrhea, Dyspareunia    POST-OPERATIVE DIAGNOSIS:  Pelvic pain,dysmenorrhea,dyspareunia                                                         Mild pelvic endometriosis, proximal obstruction of left tube  PROCEDURE:  Procedure(s):  DIAGNOSTIC LAPAROSCOPY, CHROMOPERTUBATION with Methylene Blue, ROBOTIC ASSISTED LAPAROSCOPIC LYSIS OF ADHESIONS; BIOPSIES AND CAUTHERIZATION OF ENDOMETRIOSIS   SURGEON:  Surgeon(s): Genia Del, MD Robley Fries, MD  ASSISTANTS: Dr Shea Evans   ANESTHESIA:   general  PROCEDURE:  Anesthesia with endotracheal intubation the patient is in lithotomy position. She is prepped with ChloraPrep on the abdomen and with Betadine on the suprapubic, vulvar and vaginal areas. She is draped as usual. The vaginal exam reveals an anteverted uterus normal volume mobile no adnexal mass. The weighted speculum is inserted in the vagina and the anterior lip of the cervix is grasped with a tenaculum.  The hysterometry is 8 cm. Dilation of the cervix with Hegar dilators up to #21. A #8 roomy with a small coring are put in place.  The tenaculum and weighted speculum were removed. The Foley is inserted in the bladder. Abdominally, we infiltrate the subcutaneous tissue with Marcaine one quarter plain at the infraumbilical area. A 1.5 cm incision is done with a scalpel. We opened the aponeurosis with Mayo scissors under direct vision. The peritoneum is opened bluntly with a finger.  A pursestring stitch of Vicryl 0 is done on the aponeurosis. The Roseanne Reno is inserted at that level and a pneumoperitoneum is created with CO2. The camera is inserted.  The anterior wall of the abdomen is free of adhesions. The pelvis demonstrates a normal uterus and shape and size. Both ovaries are normal in size and appearance as well.  Lesions of endometriosis are visible at the posterior  cul-de-sac and at the left ovarian fossa.  The decision is therefore taken to use the robot for conservative treatment of endometriosis.  A semicircular configuration is used with one robotic ports on the upper right, one robotic ports on the lower left and a 5 mm assistant port on the upper left. Infiltration of Marcaine one quarter plain at all incision sites, incisions with the scalpel and insertion of the ports under direct vision.  The robot his docked from the right side. Robotic instruments are inserted under direct vision with the Endo Shears scissor in the right arm and the PK in the left arm.  We go to the console. Both ureters were seen in normal anatomic location.  Pictures are taken before and after the procedures.  Methylene blue chromopertubation reveals a normal patent right tube and a proximal obstruction of the left tube. Fimbria are normal both sides.  Irrigation and suction of the Methylene blue was done. A small left paratubal cyst was removed by cauterizing and sectioning its pedicle.  A biopsy of the posterior cul-de-sac and left ovarian fossa endometriotic lesions were taken and sent to pathology.  Cauterization of the more superficial endometriosis around those lesions was done.  No other lesion of endometriosis were seen.  Hemostasis was adequate at all levels. The robotic instruments were removed. The robot was undocked.  We then went to laparoscopy.  Pictures of the normal appendix were taken. Pictures of the smooth surface liver were taken.  Good hemostasis was confirmed once more. All instruments were removed.  The ports were removed under direct vision. The CO2 was evacuated.  The pursestring stitch was attached at the infraumbilical incision. Subcuticular sutures of Vicryl 4-0 were done on all incisions. Dermabond was added on all incisions. The instruments were removed from the vagina and hemostasis was confirmed. The Foley was removed from the bladder. The patient was brought to  recovery room in good and stable status.  ESTIMATED BLOOD LOSS:  10 cc   Intake/Output Summary (Last 24 hours) at 04/03/13 0850 Last data filed at 04/03/13 0830  Gross per 24 hour  Intake    700 ml  Output     45 ml  Net    655 ml     BLOOD ADMINISTERED:none   LOCAL MEDICATIONS USED:  MARCAINE     SPECIMEN:  Source of Specimen:  Peritoneal biopsies of posterior cul de sac and left ovarian fossa  DISPOSITION OF SPECIMEN:  PATHOLOGY  COUNTS:  YES  PLAN OF CARE: Transfer to PACU  Genia Del MD  04/03/13 at 8:50 am

## 2013-04-03 NOTE — Transfer of Care (Signed)
Immediate Anesthesia Transfer of Care Note  Patient: Brianna Davis  Procedure(s) Performed: Procedure(s): LAPAROSCOPY DIAGNOSTIC (N/A) CHROMOPERTUBATION with Methylene Blue, with patent right tube and proximal obstruction of left tube (Bilateral) ROBOTIC ASSISTED LAPAROSCOPIC LYSIS OF ADHESION; LYSIS OF ENDOMETRIOSIS  (N/A)  Patient Location: PACU  Anesthesia Type:General  Level of Consciousness: awake, alert  and oriented  Airway & Oxygen Therapy: Patient Spontanous Breathing and Patient connected to nasal cannula oxygen  Post-op Assessment: Report given to PACU RN, Post -op Vital signs reviewed and stable and Patient moving all extremities X 4  Post vital signs: Reviewed and stable  Complications: No apparent anesthesia complications

## 2013-04-03 NOTE — Anesthesia Preprocedure Evaluation (Signed)
Anesthesia Evaluation  Patient identified by MRN, date of birth, ID band Patient awake    Reviewed: Allergy & Precautions, H&P , NPO status , Patient's Chart, lab work & pertinent test results  Airway Mallampati: I TM Distance: >3 FB Neck ROM: full    Dental no notable dental hx. (+) Teeth Intact   Pulmonary neg pulmonary ROS,    Pulmonary exam normal       Cardiovascular negative cardio ROS      Neuro/Psych negative psych ROS   GI/Hepatic negative GI ROS, Neg liver ROS,   Endo/Other  negative endocrine ROS  Renal/GU negative Renal ROS  negative genitourinary   Musculoskeletal negative musculoskeletal ROS (+)   Abdominal Normal abdominal exam  (+)   Peds  Hematology negative hematology ROS (+)   Anesthesia Other Findings   Reproductive/Obstetrics negative OB ROS                           Anesthesia Physical Anesthesia Plan  ASA: II  Anesthesia Plan: General   Post-op Pain Management:    Induction: Intravenous  Airway Management Planned: Oral ETT  Additional Equipment:   Intra-op Plan:   Post-operative Plan: Extubation in OR  Informed Consent: I have reviewed the patients History and Physical, chart, labs and discussed the procedure including the risks, benefits and alternatives for the proposed anesthesia with the patient or authorized representative who has indicated his/her understanding and acceptance.   Dental Advisory Given  Plan Discussed with: CRNA and Surgeon  Anesthesia Plan Comments:         Anesthesia Quick Evaluation  

## 2013-04-03 NOTE — H&P (Signed)
Brianna Davis is an 37 y.o. female G45P0A1  RP:  Pelvic pain, r/o endometriosis  Pertinent Gynecological History: Menses: flow is moderate Bleeding: None Contraception: none Blood transfusions: none Sexually transmitted diseases: no past history Previous GYN Procedures: None  Last mammogram: None  Last pap: normal  OB History: G1P0A1   Menstrual History:  No LMP recorded.    Past Medical History  Diagnosis Date  . Anxiety   . IBS (irritable bowel syndrome)   . S/P endoscopy April 2012    Dr. Jena Gauss: friability at GE junction, negative Barrett's orH.pylori, diffuse gastric petechia  . Nephrolithiasis   . Migraine   . Degenerative disc disease     Past Surgical History  Procedure Laterality Date  . Ileocolonoscopy  12/2007    Friable anal canal.  . Esophagogastroduodenoscopy  09/30/2010    ZOX:WRUEA hiatal hernia/Friability at the gastroesophageal junction with undulating Z-line versus short segment Barrett esophagus s/p bx    Family History  Problem Relation Age of Onset  . Irritable bowel syndrome Mother   . Colon cancer Neg Hx   . Inflammatory bowel disease Neg Hx   . Liver disease Neg Hx   . GI problems Sister     gastroparesis    Social History:  reports that she has been smoking Cigarettes.  She has a 14 pack-year smoking history. She has never used smokeless tobacco. She reports that she does not drink alcohol or use illicit drugs.  Allergies:  Allergies  Allergen Reactions  . Dilaudid [Hydromorphone Hcl] Nausea And Vomiting    Severe nausea and vomiting    Prescriptions prior to admission  Medication Sig Dispense Refill  . ibuprofen (ADVIL,MOTRIN) 200 MG tablet Take 600-800 mg by mouth every 6 (six) hours as needed for pain.         ROS  Blood pressure 114/78, pulse 78, temperature 98.4 F (36.9 C), temperature source Oral, resp. rate 18, SpO2 98.00%. Physical Exam  Pelvic US normal uterus and ovaries   Results for orders placed during  the hospital encounter of 04/03/13 (from the past 24 hour(s))  PREGNANCY, URINE     Status: None   Collection Time    04/03/13  6:00 AM      Result Value Range   Preg Test, Ur NEGATIVE  NEGATIVE    No results found.  Assessment/Plan: Pelvic pain, dysmeno, possible endometriosis.  Dx LPS, Chromopertubation, possible robotic assisted conservative treatment of endometriosis.  Surgery and risks reviewed.  Naveen Clardy,MARIE-LYNE 04/03/2013, 7:03 AM

## 2013-04-03 NOTE — Preoperative (Signed)
Beta Blockers   Reason not to administer Beta Blockers:Not Applicable 

## 2013-04-03 NOTE — Anesthesia Postprocedure Evaluation (Signed)
  Anesthesia Post-op Note  Patient: Brianna Davis  Procedure(s) Performed: Procedure(s): LAPAROSCOPY DIAGNOSTIC (N/A) CHROMOPERTUBATION with Methylene Blue, with patent right tube and proximal obstruction of left tube (Bilateral) ROBOTIC ASSISTED LAPAROSCOPIC LYSIS OF ADHESION; LYSIS OF ENDOMETRIOSIS  (N/A) Patient is awake and responsive. Pain and nausea are reasonably well controlled. Vital signs are stable and clinically acceptable. Oxygen saturation is clinically acceptable. There are no apparent anesthetic complications at this time. Patient is ready for discharge.

## 2013-04-04 ENCOUNTER — Encounter (HOSPITAL_COMMUNITY): Payer: Self-pay | Admitting: Obstetrics & Gynecology

## 2013-04-26 ENCOUNTER — Other Ambulatory Visit: Payer: Self-pay

## 2013-06-08 ENCOUNTER — Other Ambulatory Visit (HOSPITAL_COMMUNITY): Payer: Self-pay | Admitting: Obstetrics and Gynecology

## 2013-06-08 DIAGNOSIS — Z3141 Encounter for fertility testing: Secondary | ICD-10-CM

## 2013-06-11 ENCOUNTER — Ambulatory Visit (HOSPITAL_COMMUNITY): Payer: 59

## 2013-07-03 ENCOUNTER — Other Ambulatory Visit (HOSPITAL_COMMUNITY): Payer: Self-pay | Admitting: Obstetrics and Gynecology

## 2013-07-03 DIAGNOSIS — N809 Endometriosis, unspecified: Secondary | ICD-10-CM

## 2013-07-06 ENCOUNTER — Ambulatory Visit (HOSPITAL_COMMUNITY): Payer: 59

## 2013-07-09 ENCOUNTER — Ambulatory Visit (HOSPITAL_COMMUNITY)
Admission: RE | Admit: 2013-07-09 | Discharge: 2013-07-09 | Disposition: A | Discharge status: Home / Self Care | Payer: 59 | Source: Ambulatory Visit | Attending: Obstetrics and Gynecology | Admitting: Obstetrics and Gynecology

## 2013-07-09 DIAGNOSIS — N809 Endometriosis, unspecified: Secondary | ICD-10-CM

## 2013-07-09 DIAGNOSIS — N979 Female infertility, unspecified: Secondary | ICD-10-CM | POA: Insufficient documentation

## 2013-07-09 MED ORDER — IOHEXOL 300 MG/ML  SOLN
20.0000 mL | Freq: Once | INTRAMUSCULAR | Status: AC | PRN
  Administered 2013-07-09: 20 mL

## 2013-08-13 ENCOUNTER — Encounter: Payer: Self-pay | Admitting: Family Medicine

## 2013-08-13 ENCOUNTER — Telehealth: Payer: Self-pay | Admitting: Family Medicine

## 2013-08-13 NOTE — Telephone Encounter (Signed)
Notified patient excuse ready

## 2013-08-13 NOTE — Telephone Encounter (Signed)
Patient needs a work excuse for 08/11/2013. She has degenerative disc disease and this is something she has been dealing with for a long time and doesn't feel she needs to be seen. It is better now. She just needs a work excuse for missing that day to return on 08/17/2013.

## 2013-08-13 NOTE — Telephone Encounter (Signed)
Sure, but if needing ,more then NTBS

## 2013-09-24 ENCOUNTER — Telehealth: Payer: Self-pay | Admitting: Family Medicine

## 2013-09-24 MED ORDER — CYCLOBENZAPRINE HCL 10 MG PO TABS: 10.0000 mg | ORAL_TABLET | Freq: Three times a day (TID) | ORAL | Status: DC | PRN

## 2013-09-24 NOTE — Telephone Encounter (Signed)
Discussed with patient. Med sent to [facility] [place].

## 2013-09-24 NOTE — Telephone Encounter (Signed)
May send him prescription for Flexeril 10 mg. 1 3 times a day when necessary spasms cautioned drowsiness. #30. One refill. Also let patient know that if she does need referral we would need to see her to help set that up.

## 2013-09-24 NOTE — Telephone Encounter (Signed)
Pt states her lower back has been giving her some issues, sciatic mainly. Wants to know if you will call her in some flexeril?   She Is going to see if her insurance needs a referral to see a sports medicine Doc to Get a shot in her back. If she needs a referral she'll call us back.

## 2013-09-24 NOTE — Telephone Encounter (Signed)
Last seen 03/12/13 for lumbar pain.

## 2013-11-13 LAB — OB RESULTS CONSOLE RUBELLA ANTIBODY, IGM: RUBELLA: IMMUNE

## 2013-11-13 LAB — OB RESULTS CONSOLE HEPATITIS B SURFACE ANTIGEN: HEP B S AG: NEGATIVE

## 2013-11-13 LAB — OB RESULTS CONSOLE HIV ANTIBODY (ROUTINE TESTING): HIV: NONREACTIVE

## 2013-11-13 LAB — OB RESULTS CONSOLE RPR: RPR: NONREACTIVE

## 2013-11-14 LAB — OB RESULTS CONSOLE GC/CHLAMYDIA
Chlamydia: NEGATIVE
Gonorrhea: NEGATIVE

## 2013-12-23 ENCOUNTER — Encounter (HOSPITAL_COMMUNITY): Payer: Self-pay | Admitting: Family

## 2013-12-23 ENCOUNTER — Inpatient Hospital Stay (HOSPITAL_COMMUNITY)
Admission: AD | Admit: 2013-12-23 | Discharge: 2013-12-23 | Disposition: A | Discharge status: Home / Self Care | Payer: 59 | Source: Ambulatory Visit | Attending: Obstetrics & Gynecology | Admitting: Obstetrics & Gynecology

## 2013-12-23 DIAGNOSIS — N898 Other specified noninflammatory disorders of vagina: Secondary | ICD-10-CM | POA: Insufficient documentation

## 2013-12-23 DIAGNOSIS — O9989 Other specified diseases and conditions complicating pregnancy, childbirth and the puerperium: Principal | ICD-10-CM

## 2013-12-23 DIAGNOSIS — K589 Irritable bowel syndrome without diarrhea: Secondary | ICD-10-CM | POA: Insufficient documentation

## 2013-12-23 DIAGNOSIS — W19XXXA Unspecified fall, initial encounter: Secondary | ICD-10-CM | POA: Insufficient documentation

## 2013-12-23 DIAGNOSIS — O26892 Other specified pregnancy related conditions, second trimester: Secondary | ICD-10-CM

## 2013-12-23 DIAGNOSIS — O99330 Smoking (tobacco) complicating pregnancy, unspecified trimester: Secondary | ICD-10-CM | POA: Insufficient documentation

## 2013-12-23 DIAGNOSIS — O99891 Other specified diseases and conditions complicating pregnancy: Secondary | ICD-10-CM | POA: Insufficient documentation

## 2013-12-23 DIAGNOSIS — Y92009 Unspecified place in unspecified non-institutional (private) residence as the place of occurrence of the external cause: Secondary | ICD-10-CM | POA: Insufficient documentation

## 2013-12-23 LAB — AMNISURE RUPTURE OF MEMBRANE (ROM) NOT AT ARMC: AMNISURE: NEGATIVE

## 2013-12-23 LAB — WET PREP, GENITAL
CLUE CELLS WET PREP: NONE SEEN
Trich, Wet Prep: NONE SEEN

## 2013-12-23 LAB — URINALYSIS, ROUTINE W REFLEX MICROSCOPIC
BILIRUBIN URINE: NEGATIVE
GLUCOSE, UA: NEGATIVE mg/dL
Hgb urine dipstick: NEGATIVE
KETONES UR: NEGATIVE mg/dL
Leukocytes, UA: NEGATIVE
Nitrite: NEGATIVE
Protein, ur: NEGATIVE mg/dL
Specific Gravity, Urine: 1.015 (ref 1.005–1.030)
Urobilinogen, UA: 0.2 mg/dL (ref 0.0–1.0)
pH: 8 (ref 5.0–8.0)

## 2013-12-23 NOTE — Discharge Instructions (Signed)
Second Trimester of Pregnancy The second trimester is from week 13 through week 28, months 4 through 6. The second trimester is often a time when you feel your best. Your body has also adjusted to being pregnant, and you begin to feel better physically. Usually, morning sickness has lessened or quit completely, you may have more energy, and you may have an increase in appetite. The second trimester is also a time when the fetus is growing rapidly. At the end of the sixth month, the fetus is about 9 inches long and weighs about 1 pounds. You will likely begin to feel the baby move (quickening) between 18 and 20 weeks of the pregnancy. BODY CHANGES Your body goes through many changes during pregnancy. The changes vary from woman to woman.   Your weight will continue to increase. You will notice your lower abdomen bulging out.  You may begin to get stretch marks on your hips, abdomen, and breasts.  You may develop headaches that can be relieved by medicines approved by your health care provider.  You may urinate more often because the fetus is pressing on your bladder.  You may develop or continue to have heartburn as a result of your pregnancy.  You may develop constipation because certain hormones are causing the muscles that push waste through your intestines to slow down.  You may develop hemorrhoids or swollen, bulging veins (varicose veins).  You may have back pain because of the weight gain and pregnancy hormones relaxing your joints between the bones in your pelvis and as a result of a shift in weight and the muscles that support your balance.  Your breasts will continue to grow and be tender.  Your gums may bleed and may be sensitive to brushing and flossing.  Dark spots or blotches (chloasma, mask of pregnancy) may develop on your face. This will likely fade after the baby is born.  A dark line from your belly button to the pubic area (linea nigra) may appear. This will likely fade  after the baby is born.  You may have changes in your hair. These can include thickening of your hair, rapid growth, and changes in texture. Some women also have hair loss during or after pregnancy, or hair that feels dry or thin. Your hair will most likely return to normal after your baby is born. WHAT TO EXPECT AT YOUR PRENATAL VISITS During a routine prenatal visit:  You will be weighed to make sure you and the fetus are growing normally.  Your blood pressure will be taken.  Your abdomen will be measured to track your baby's growth.  The fetal heartbeat will be listened to.  Any test results from the previous visit will be discussed. Your health care provider may ask you:  How you are feeling.  If you are feeling the baby move.  If you have had any abnormal symptoms, such as leaking fluid, bleeding, severe headaches, or abdominal cramping.  If you have any questions. Other tests that may be performed during your second trimester include:  Blood tests that check for:  Low iron levels (anemia).  Gestational diabetes (between 24 and 28 weeks).  Rh antibodies.  Urine tests to check for infections, diabetes, or protein in the urine.  An ultrasound to confirm the proper growth and development of the baby.  An amniocentesis to check for possible genetic problems.  Fetal screens for spina bifida and Down syndrome. HOME CARE INSTRUCTIONS   Avoid all smoking, herbs, alcohol, and unprescribed   drugs. These chemicals affect the formation and growth of the baby.  Follow your health care provider's instructions regarding medicine use. There are medicines that are either safe or unsafe to take during pregnancy.  Exercise only as directed by your health care provider. Experiencing uterine cramps is a good sign to stop exercising.  Continue to eat regular, healthy meals.  Wear a good support bra for breast tenderness.  Do not use hot tubs, steam rooms, or saunas.  Wear your  seat belt at all times when driving.  Avoid raw meat, uncooked cheese, cat litter boxes, and soil used by cats. These carry germs that can cause birth defects in the baby.  Take your prenatal vitamins.  Try taking a stool softener (if your health care provider approves) if you develop constipation. Eat more high-fiber foods, such as fresh vegetables or fruit and whole grains. Drink plenty of fluids to keep your urine clear or pale yellow.  Take warm sitz baths to soothe any pain or discomfort caused by hemorrhoids. Use hemorrhoid cream if your health care provider approves.  If you develop varicose veins, wear support hose. Elevate your feet for 15 minutes, 3-4 times a day. Limit salt in your diet.  Avoid heavy lifting, wear low heel shoes, and practice good posture.  Rest with your legs elevated if you have leg cramps or low back pain.  Visit your dentist if you have not gone yet during your pregnancy. Use a soft toothbrush to brush your teeth and be gentle when you floss.  A sexual relationship may be continued unless your health care provider directs you otherwise.  Continue to go to all your prenatal visits as directed by your health care provider. SEEK MEDICAL CARE IF:   You have dizziness.  You have mild pelvic cramps, pelvic pressure, or nagging pain in the abdominal area.  You have persistent nausea, vomiting, or diarrhea.  You have a bad smelling vaginal discharge.  You have pain with urination. SEEK IMMEDIATE MEDICAL CARE IF:   You have a fever.  You are leaking fluid from your vagina.  You have spotting or bleeding from your vagina.  You have severe abdominal cramping or pain.  You have rapid weight gain or loss.  You have shortness of breath with chest pain.  You notice sudden or extreme swelling of your face, hands, ankles, feet, or legs.  You have not felt your baby move in over an hour.  You have severe headaches that do not go away with  medicine.  You have vision changes. Document Released: 06/01/2001 Document Revised: 06/12/2013 Document Reviewed: 08/08/2012 [employer] Patient Information 2015 [employer]. This information is not intended to replace advice given to you by your health care provider. Make sure you discuss any questions you have with your health care provider.  

## 2013-12-23 NOTE — MAU Provider Note (Signed)
History     CSN: [id number]  Arrival date and time: 12/23/13 1228 Provider on unit: 1200 Orders placed in EPIC: 1239 Provider at bedside: 1245    HPI  [name] is a 38 yo G2P0010 female at 16.[redacted] wks gestation presenting with complaints of increased watery vaginal d/c since this morning.  She fell on her porch yesterday hitting her bottom.  No pain, VB or LOF yesterday.  She was only concerned that her d/c became watery this morning.  Denies pain or VB today.  Past Medical History  Diagnosis Date  . Anxiety   . IBS (irritable bowel syndrome)   . S/P endoscopy April 2012    Dr. Jena Gaussourk: friability at GE junction, negative Barrett's orH.pylori, diffuse gastric petechia  . Nephrolithiasis   . Migraine   . Degenerative disc disease     Past Surgical History  Procedure Laterality Date  . Ileocolonoscopy  12/2007    Friable anal canal.  . Esophagogastroduodenoscopy  09/30/2010    WUJ:WJXBJRMR:Small hiatal hernia/Friability at the gastroesophageal junction with undulating Z-line versus short segment Barrett esophagus s/p bx  . Laparoscopy N/A 04/03/2013    Procedure: LAPAROSCOPY DIAGNOSTIC;  Surgeon: Genia DelMarie-Lyne Lavoie, MD;  Location: [facility];  Service: Gynecology;  Laterality: N/A;  . Chromopertubation Bilateral 04/03/2013    Procedure: CHROMOPERTUBATION with Methylene Blue, with patent right tube and proximal obstruction of left tube;  Surgeon: Genia DelMarie-Lyne Lavoie, MD;  Location: [facility];  Service: Gynecology;  Laterality: Bilateral;  . Robotic assisted laparoscopic lysis of adhesion N/A 04/03/2013    Procedure: ROBOTIC ASSISTED LAPAROSCOPIC LYSIS OF ADHESION; LYSIS OF ENDOMETRIOSIS ;  Surgeon: Genia DelMarie-Lyne Lavoie, MD;  Location: [facility];  Service: Gynecology;  Laterality: N/A;    Family History  Problem Relation Age of Onset  . Irritable bowel syndrome Mother   . Colon cancer Neg Hx   . Inflammatory bowel disease Neg Hx   . Liver disease Neg Hx   . GI problems Sister     gastroparesis     History  Substance Use Topics  . Smoking status: Current Every Day Smoker -- 1.00 packs/day for 14 years    Types: Cigarettes  . Smokeless tobacco: Never Used  . Alcohol Use: No    Allergies:  Allergies  Allergen Reactions  . Dilaudid [Hydromorphone Hcl] Nausea And Vomiting    Severe nausea and vomiting    Prescriptions prior to admission  Medication Sig Dispense Refill  . cyclobenzaprine (FLEXERIL) 10 MG tablet Take 1 tablet (10 mg total) by mouth 3 (three) times daily as needed for muscle spasms.  30 tablet  1  . ibuprofen (ADVIL,MOTRIN) 200 MG tablet Take 600-800 mg by mouth every 6 (six) hours as needed for pain.       Marland Kitchen. oxyCODONE-acetaminophen (PERCOCET) 7.5-325 MG per tablet Take 1 tablet by mouth every 4 (four) hours as needed for pain.  20 tablet  0    Review of Systems  Constitutional: Negative.   HENT: Negative.   Eyes: Negative.   Respiratory: Negative.   Cardiovascular: Negative.   Gastrointestinal: Negative.   Genitourinary: Negative.        Increased clear, watery vaginal discharge since this morning  Musculoskeletal: Negative.   Skin: Negative.   Neurological: Negative.   Endo/Heme/Allergies: Negative.   Psychiatric/Behavioral: Negative.     Results for orders placed during the hospital encounter of 12/23/13 (from the past 24 hour(s))  URINALYSIS, ROUTINE W REFLEX MICROSCOPIC     Status: Abnormal   Collection Time  12/23/13 12:30 PM      Result Value Ref Range   Color, Urine YELLOW  YELLOW   APPearance HAZY (*) CLEAR   Specific Gravity, Urine 1.015  1.005 - 1.030   pH 8.0  5.0 - 8.0   Glucose, UA NEGATIVE  NEGATIVE mg/dL   Hgb urine dipstick NEGATIVE  NEGATIVE   Bilirubin Urine NEGATIVE  NEGATIVE   Ketones, ur NEGATIVE  NEGATIVE mg/dL   Protein, ur NEGATIVE  NEGATIVE mg/dL   Urobilinogen, UA 0.2  0.0 - 1.0 mg/dL   Nitrite NEGATIVE  NEGATIVE   Leukocytes, UA NEGATIVE  NEGATIVE  WET PREP, GENITAL     Status: Abnormal   Collection Time     12/23/13 12:55 PM      Result Value Ref Range   Yeast Wet Prep HPF POC FEW (*) NONE SEEN   Trich, Wet Prep NONE SEEN  NONE SEEN   Clue Cells Wet Prep HPF POC NONE SEEN  NONE SEEN   WBC, Wet Prep HPF POC MODERATE (*) NONE SEEN  AMNISURE RUPTURE OF MEMBRANE (ROM)     Status: None   Collection Time    12/23/13 12:55 PM      Result Value Ref Range   Amnisure ROM NEGATIVE     Doppler FHT's: 147 bpm  Physical Exam   Height 5\' 4"  (1.626 m), weight 81.647 kg (180 lb). BP 136/80  Pulse 80  Resp 12  Ht 5\' 4"  (1.626 m)  Wt 81.647 kg (180 lb)  BMI 30.88 kg/m2  LMP 06/29/2013   Physical Exam  Constitutional: She is oriented to person, place, and time. She appears well-developed and well-nourished.  HENT:  Head: Normocephalic and atraumatic.  Eyes: Pupils are equal, round, and reactive to light.  Neck: Normal range of motion.  Cardiovascular: Normal rate, regular rhythm and normal heart sounds.   Respiratory: Effort normal and breath sounds normal.  GI: Soft. Bowel sounds are normal.  Genitourinary: Uterus normal. Vaginal discharge found.  Gravid, soft, nontender; Small amount of thin, white vaginal discharge; wet prep done  Musculoskeletal: Normal range of motion.  Neurological: She is alert and oriented to person, place, and time. She has normal reflexes.  Skin: Skin is warm and dry.  Freckles over entire body, sun-burned or tanned appearance to skin on chest and face  Psychiatric: She has a normal mood and affect. Her behavior is normal. Judgment and thought content normal.    MAU Course  Procedures CCUA FHTs Wet Prep Assessment and Plan  38 yo G2P0010 @ 16.[redacted]wks gestation Vaginal Discharge in third trimester  Discharge home Keep scheduled appointment Bleeding precautions reviewed Call back for suspected ROM or pain  Kenard GowerDAWSON, Rain [place], M MSN, CNM 12/23/2013, 12:58 PM

## 2013-12-23 NOTE — MAU Note (Signed)
38 yo, G2P0 at 16w, presents to MAU with c/o watery discharge since this morning. Denies VB or cramping.

## 2013-12-24 NOTE — MAU Provider Note (Signed)
Reviewed and agree with note and plan. V.Madalyn Legner, MD  

## 2014-01-28 ENCOUNTER — Encounter (HOSPITAL_COMMUNITY): Payer: Self-pay | Admitting: Obstetrics and Gynecology

## 2014-01-28 ENCOUNTER — Inpatient Hospital Stay (HOSPITAL_COMMUNITY)
Admission: AD | Admit: 2014-01-28 | Discharge: 2014-01-28 | Disposition: A | Discharge status: Home / Self Care | Payer: 59 | Source: Ambulatory Visit | Attending: Obstetrics and Gynecology | Admitting: Obstetrics and Gynecology

## 2014-01-28 DIAGNOSIS — O3440 Maternal care for other abnormalities of cervix, unspecified trimester: Secondary | ICD-10-CM | POA: Insufficient documentation

## 2014-01-28 DIAGNOSIS — O26859 Spotting complicating pregnancy, unspecified trimester: Secondary | ICD-10-CM | POA: Insufficient documentation

## 2014-01-28 DIAGNOSIS — K589 Irritable bowel syndrome without diarrhea: Secondary | ICD-10-CM | POA: Insufficient documentation

## 2014-01-28 DIAGNOSIS — O4690 Antepartum hemorrhage, unspecified, unspecified trimester: Secondary | ICD-10-CM

## 2014-01-28 DIAGNOSIS — O99330 Smoking (tobacco) complicating pregnancy, unspecified trimester: Secondary | ICD-10-CM | POA: Diagnosis not present

## 2014-01-28 NOTE — MAU Note (Signed)
Pt states has had vaginal spotting x 2 months r/t placenta previa; resolved as of visit this past Friday. States has passed 2 black "chunks" of tissue like substance. Lower abdominal cramping x 2 months, states hasn't changed. Has been several months since last had intercourse.

## 2014-01-28 NOTE — MAU Provider Note (Signed)
History     CSN: [id number]  Arrival date and time: 01/28/14 4098 Provider on unit: 1957 Provider at bedside: 2045     Chief Complaint  Patient presents with  . Vaginal Bleeding   Vaginal Bleeding  [name] is a 38 yo G2P0010 at 22.[redacted] wks gestation presenting with c/o vaginal spotting.  She reports having a placenta previa that has resolved Brianna Davis 8/7), but she continues to have dark, red blood.  She had some "tissue like clots" to pass with wiping in BR.  Mild cramping that is not unusual.   Denies LOF.  FM first detected 8/7, so unsure if (+) FM. Anterior placenta per [facility] sono report.  Rh positive.  Her primary provider at [facility] is: Dr. Seymour Davis.  Past Medical History  Diagnosis Date  . Anxiety   . IBS (irritable bowel syndrome)   . S/P endoscopy April 2012    Dr. Jena Davis: friability at GE junction, negative Barrett's orH.pylori, diffuse gastric petechia  . Nephrolithiasis   . Migraine   . Degenerative disc disease     Past Surgical History  Procedure Laterality Date  . Ileocolonoscopy  12/2007    Friable anal canal.  . Esophagogastroduodenoscopy  09/30/2010    Brianna Davis  . Laparoscopy N/A 04/03/2013    Procedure: LAPAROSCOPY DIAGNOSTIC;  Surgeon: Brianna Del, MD;  Location: [facility];  Service: Gynecology;  Laterality: N/A;  . Chromopertubation Bilateral 04/03/2013    Procedure: CHROMOPERTUBATION with Methylene Blue, with patent right tube and proximal obstruction of left tube;  Surgeon: Brianna Del, MD;  Location: [facility];  Service: Gynecology;  Laterality: Bilateral;  . Robotic assisted laparoscopic lysis of adhesion N/A 04/03/2013    Procedure: ROBOTIC ASSISTED LAPAROSCOPIC LYSIS OF ADHESION; LYSIS OF ENDOMETRIOSIS ;  Surgeon: Brianna Del, MD;  Location: [facility];  Service: Gynecology;  Laterality: N/A;    Family History    Problem Relation Age of Onset  . Irritable bowel syndrome Mother   . Colon cancer Neg Hx   . Inflammatory bowel disease Neg Hx   . Liver disease Neg Hx   . GI problems Sister     gastroparesis    History  Substance Use Topics  . Smoking status: Current Every Day Smoker -- 1.00 packs/day for 14 years    Types: Cigarettes  . Smokeless tobacco: Never Used  . Alcohol Use: No    Allergies:  Allergies  Allergen Reactions  . Dilaudid [Hydromorphone Hcl] Nausea And Vomiting    Severe nausea and vomiting    Prescriptions prior to admission  Medication Sig Dispense Refill  . Prenatal Vit-Fe Fumarate-FA (PRENATAL MULTIVITAMIN) TABS tablet Take 1 tablet by mouth daily at 12 noon.      . promethazine (PHENERGAN) 12.5 MG tablet Take 12.5 mg by mouth every 6 (six) hours as needed for nausea or vomiting.        Review of Systems  Constitutional: Negative.   HENT: Negative.   Eyes: Negative.   Respiratory: Negative.   Cardiovascular: Negative.   Gastrointestinal: Negative.   Genitourinary: Positive for vaginal bleeding.       Scant brown vaginal spotting  Skin: Negative.   Neurological: Negative.   Endo/Heme/Allergies: Negative.   Psychiatric/Behavioral: Negative.    FHTs: 130 bpm  Physical Exam   Blood pressure 142/80, pulse 99, temperature 99 F (37.2 C), temperature source Oral, resp. rate 20, height 5' 3.5" (1.613 m), weight  85.004 kg (187 lb 6.4 oz), last menstrual period 06/29/2013, SpO2 100.00%.  Physical Exam  Constitutional: She is oriented to person, place, and time. She appears well-developed and well-nourished.  HENT:  Head: Normocephalic and atraumatic.  Eyes: Pupils are equal, round, and reactive to light.  Neck: Normal range of motion. Neck supple.  Cardiovascular: Normal rate, regular rhythm and normal heart sounds.   Respiratory: Effort normal and breath sounds normal.  GI: Soft. Bowel sounds are normal.  Genitourinary: Uterus normal.    Gravid, S=D,  scant amount of brown old blood removed from vaginal vault. No active bleeding seen from cervical oc/ cervical polyp mildly friable - scant red blood on swab  Musculoskeletal: Normal range of motion.  Neurological: She is alert and oriented to person, place, and time. She has normal reflexes.  Skin: Skin is warm and dry.  Psychiatric: She has a normal mood and affect. Her behavior is normal. Judgment and thought content normal.    MAU Course  Procedures SSE  Assessment and Plan  38 yo G2P0010 @ 22.[redacted] wks gestation Bleeding in Pregnancy, second trimester Cervical Polyp  Discharge home Bleeding precautions reviewed Pelvic Rest Keep scheduled appointment F/U prn   *Dr. Billy Davis notified of assessment and plan - agrees  Brianna GowerAWSON, Brianna Davis, M MSN, CNM 01/28/2014, 8:57 PM

## 2014-01-28 NOTE — Discharge Instructions (Signed)
Pelvic Rest Pelvic rest is sometimes recommended for women when:   The placenta is partially or completely covering the opening of the cervix (placenta previa).  There is bleeding between the uterine wall and the amniotic sac in the first trimester (subchorionic hemorrhage).  The cervix begins to open without labor starting (incompetent cervix, cervical insufficiency).  The labor is too early (preterm labor). HOME CARE INSTRUCTIONS  Do not have sexual intercourse, stimulation, or an orgasm.  Do not use tampons, douche, or put anything in the vagina.  Do not lift anything over 10 pounds (4.5 kg).  Avoid strenuous activity or straining your pelvic muscles. SEEK MEDICAL CARE IF:  You have any vaginal bleeding during pregnancy. Treat this as a potential emergency.  You have cramping pain felt low in the stomach (stronger than menstrual cramps).  You notice vaginal discharge (watery, mucus, or bloody).  You have a low, dull backache.  There are regular contractions or uterine tightening. SEEK IMMEDIATE MEDICAL CARE IF: You have vaginal bleeding and have placenta previa.  Document Released: 10/02/2010 Document Revised: 08/30/2011 Document Reviewed: 10/02/2010 [facility] LLCExitCare Patient Information 2015 [place], [place]. This information is not intended to replace advice given to you by your health care provider. Make sure you discuss any questions you have with your health care provider.  Vaginal Bleeding During Pregnancy, Second Trimester A small amount of bleeding (spotting) from the vagina is relatively common in pregnancy. It usually stops on its own. Various things can cause bleeding or spotting in pregnancy. Some bleeding may be related to the pregnancy, and some may not. Sometimes the bleeding is normal and is not a problem. However, bleeding can also be a sign of something serious. Be sure to tell your health care provider about any vaginal bleeding right away. Some possible causes of  vaginal bleeding during the second trimester include:  Infection, inflammation, or growths on the cervix.   The placenta may be partially or completely covering the opening of the cervix inside the uterus (placenta previa).  The placenta may have separated from the uterus (abruption of the placenta).   You may be having early (preterm) labor.   The cervix may not be strong enough to keep a baby inside the uterus (cervical insufficiency).   Tiny cysts may have developed in the uterus instead of pregnancy tissue (molar pregnancy). HOME CARE INSTRUCTIONS  Watch your condition for any changes. The following actions may help to lessen any discomfort you are feeling:  Follow your health care provider's instructions for limiting your activity. If your health care provider orders bed rest, you may need to stay in bed and only get up to use the bathroom. However, your health care provider may allow you to continue light activity.  If needed, make plans for someone to help with your regular activities and responsibilities while you are on bed rest.  Keep track of the number of pads you use each day, how often you change pads, and how soaked (saturated) they are. Write this down.  Do not use tampons. Do not douche.  Do not have sexual intercourse or orgasms until approved by your health care provider.  If you pass any tissue from your vagina, save the tissue so you can show it to your health care provider.  Only take over-the-counter or prescription medicines as directed by your health care provider.  Do not take aspirin because it can make you bleed.  Do not exercise or perform any strenuous activities or heavy lifting without your  health care provider's permission. °· Keep all follow-up appointments as directed by your health care provider. °SEEK MEDICAL CARE IF: °· You have any vaginal bleeding during any part of your pregnancy. °· You have cramps or labor pains. °· You have a fever, not  controlled by medicine. °SEEK IMMEDIATE MEDICAL CARE IF:  °· You have severe cramps in your back or belly (abdomen). °· You have contractions. °· You have chills. °· You pass large clots or tissue from your vagina. °· Your bleeding increases. °· You feel light-headed or weak, or you have fainting episodes. °· You are leaking fluid or have a gush of fluid from your vagina. °MAKE SURE YOU: °· Understand these instructions. °· Will watch your condition. °· Will get help right away if you are not doing well or get worse. °Document Released: 03/17/2005 Document Revised: 06/12/2013 Document Reviewed: 02/12/2013 °ExitCare® Patient Information ©2015 [employer]. This information is not intended to replace advice given to you by your health care provider. Make sure you discuss any questions you have with your health care provider. ° °

## 2014-04-22 ENCOUNTER — Encounter (HOSPITAL_COMMUNITY): Payer: Self-pay | Admitting: Obstetrics and Gynecology

## 2014-05-15 ENCOUNTER — Encounter (HOSPITAL_COMMUNITY): Payer: Self-pay | Admitting: *Deleted

## 2014-05-15 ENCOUNTER — Inpatient Hospital Stay (HOSPITAL_COMMUNITY)
Admission: AD | Admit: 2014-05-15 | Discharge: 2014-05-15 | Disposition: A | Discharge status: Home / Self Care | Payer: 59 | Source: Ambulatory Visit | Attending: Obstetrics and Gynecology | Admitting: Obstetrics and Gynecology

## 2014-05-15 DIAGNOSIS — Z3A37 37 weeks gestation of pregnancy: Secondary | ICD-10-CM | POA: Diagnosis not present

## 2014-05-15 DIAGNOSIS — O133 Gestational [pregnancy-induced] hypertension without significant proteinuria, third trimester: Secondary | ICD-10-CM | POA: Diagnosis not present

## 2014-05-15 DIAGNOSIS — Z87891 Personal history of nicotine dependence: Secondary | ICD-10-CM | POA: Diagnosis not present

## 2014-05-15 LAB — COMPREHENSIVE METABOLIC PANEL
ALBUMIN: 2.8 g/dL — AB (ref 3.5–5.2)
ALK PHOS: 126 U/L — AB (ref 39–117)
ALT: 14 U/L (ref 0–35)
ANION GAP: 14 (ref 5–15)
AST: 21 U/L (ref 0–37)
BUN: 6 mg/dL (ref 6–23)
CO2: 21 mEq/L (ref 19–32)
Calcium: 9.5 mg/dL (ref 8.4–10.5)
Chloride: 101 mEq/L (ref 96–112)
Creatinine, Ser: 0.77 mg/dL (ref 0.50–1.10)
GFR calc Af Amer: >90 mL/min (ref >90)
GFR calc non Af Amer: >90 mL/min (ref >90)
Glucose, Bld: 127 mg/dL — ABNORMAL HIGH (ref 70–99)
Potassium: 3.7 mEq/L (ref 3.7–5.3)
SODIUM: 136 meq/L — AB (ref 137–147)
TOTAL PROTEIN: 6.3 g/dL (ref 6.0–8.3)
Total Bilirubin: <0.2 mg/dL — ABNORMAL LOW (ref 0.3–1.2)

## 2014-05-15 LAB — CBC
HEMATOCRIT: 35 % — AB (ref 36.0–46.0)
Hemoglobin: 11.6 g/dL — ABNORMAL LOW (ref 12.0–15.0)
MCH: 30 pg (ref 26.0–34.0)
MCHC: 33.1 g/dL (ref 30.0–36.0)
MCV: 90.4 fL (ref 78.0–100.0)
Platelets: 372 10*3/uL (ref 150–400)
RBC: 3.87 MIL/uL (ref 3.87–5.11)
RDW: 13.5 % (ref 11.5–15.5)
WBC: 9 10*3/uL (ref 4.0–10.5)

## 2014-05-15 LAB — PROTEIN / CREATININE RATIO, URINE
CREATININE, URINE: 45.62 mg/dL
Protein Creatinine Ratio: 0.12 (ref 0.00–0.15)
TOTAL PROTEIN, URINE: 5.3 mg/dL

## 2014-05-15 LAB — OB RESULTS CONSOLE GBS: STREP GROUP B AG: POSITIVE

## 2014-05-15 LAB — URIC ACID: Uric Acid, Serum: 4.7 mg/dL (ref 2.4–7.0)

## 2014-05-15 MED ORDER — LABETALOL HCL 100 MG PO TABS: 100.0000 mg | ORAL_TABLET | Freq: Two times a day (BID) | ORAL | Status: DC

## 2014-05-15 NOTE — MAU Provider Note (Signed)
History     CSN: [id number]  Arrival date and time: 05/15/14 2128   First Provider Initiated Contact with Patient 05/15/14 2211      Chief Complaint  Patient presents with  . Hypertension   HPI Comments: G1 @ 37.4 wks c/o increased BPs tonight checked at home. Dull HA since yesterday responsive to Tylenol. No visual disturbances or epigastric pain. Good FM. No LOF, VB, or ctx. Pregnancy complicated by AMA and elevated BP earlier today during OV.    OB History    Gravida Para Term Preterm AB TAB SAB Ectopic Multiple Living   2    1  1          Past Medical History  Diagnosis Date  . Anxiety   . IBS (irritable bowel syndrome)   . S/P endoscopy April 2012    Dr. Jena Gaussourk: friability at GE junction, negative Barrett's orH.pylori, diffuse gastric petechia  . Nephrolithiasis   . Migraine   . Degenerative disc disease     Past Surgical History  Procedure Laterality Date  . Ileocolonoscopy  12/2007    Friable anal canal.  . Esophagogastroduodenoscopy  09/30/2010    WUJ:[id number]:Small hiatal hernia/Friability at the gastroesophageal junction with undulating Z-line versus short segment Barrett esophagus s/p bx  . Laparoscopy N/A 04/03/2013    Procedure: LAPAROSCOPY DIAGNOSTIC;  Surgeon: Genia DelMarie-Lyne Lavoie, MD;  Location: [facility];  Service: Gynecology;  Laterality: N/A;  . Chromopertubation Bilateral 04/03/2013    Procedure: CHROMOPERTUBATION with Methylene Blue, with patent right tube and proximal obstruction of left tube;  Surgeon: Genia DelMarie-Lyne Lavoie, MD;  Location: [facility];  Service: Gynecology;  Laterality: Bilateral;  . Robotic assisted laparoscopic lysis of adhesion N/A 04/03/2013    Procedure: ROBOTIC ASSISTED LAPAROSCOPIC LYSIS OF ADHESION; LYSIS OF ENDOMETRIOSIS ;  Surgeon: Genia DelMarie-Lyne Lavoie, MD;  Location: [facility];  Service: Gynecology;  Laterality: N/A;    Family History  Problem Relation Age of Onset  . Irritable bowel syndrome Mother   . Colon cancer Neg Hx   . Inflammatory bowel  disease Neg Hx   . Liver disease Neg Hx   . GI problems Sister     gastroparesis    History  Substance Use Topics  . Smoking status: Former Smoker -- 1.00 packs/day for 14 years    Types: Cigarettes  . Smokeless tobacco: Never Used  . Alcohol Use: No    Allergies:  Allergies  Allergen Reactions  . Dilaudid [Hydromorphone Hcl] Nausea And Vomiting    Severe nausea and vomiting    Prescriptions prior to admission  Medication Sig Dispense Refill Last Dose  . acetaminophen (TYLENOL) 325 MG tablet Take 650 mg by mouth every 6 (six) hours as needed for mild pain or headache.    05/15/2014 at 1730  . calcium carbonate (TUMS EX) 750 MG chewable tablet Chew 1 tablet by mouth 4 (four) times daily as needed for heartburn.    05/15/2014 at Unknown time  . Emollient (EUCERIN) lotion Apply 1 Bottle topically as needed (For eczema.).   05/14/2014 at Unknown time  . Prenatal Vit-Fe Fumarate-FA (PRENATAL MULTIVITAMIN) TABS tablet Take 1 tablet by mouth at bedtime.    05/14/2014 at Unknown time  . ranitidine (ZANTAC) 150 MG tablet Take 150 mg by mouth 2 (two) times daily as needed for heartburn.    05/15/2014 at Unknown time    Review of Systems  Constitutional: Negative.   Eyes: Negative.   Respiratory: Negative.   Cardiovascular: Negative.   Gastrointestinal: Negative.  Genitourinary: Negative.   Musculoskeletal: Negative.   Skin: Negative.   Neurological: Positive for headaches.  Endo/Heme/Allergies: Negative.   Psychiatric/Behavioral: Negative.    Physical Exam   Blood pressure 126/90, pulse 124, last menstrual period 06/29/2013.  Physical Exam  Constitutional: She appears well-developed and well-nourished.  HENT:  Head: Normocephalic and atraumatic.  Eyes: Pupils are equal, round, and reactive to light.  Neck: Normal range of motion. Neck supple.  Cardiovascular: Normal rate and regular rhythm.   Respiratory: Effort normal and breath sounds normal.  GI: Soft. Bowel sounds  are normal.  Genitourinary: Vagina normal.  SVE: closed/long/high  Musculoskeletal: Normal range of motion.  Neurological: She is alert.  Skin: Skin is warm and dry.  Psychiatric: She has a normal mood and affect.  EFM: 150 bpm, mod variability, +accels, no decels Toco: irregular, mild  MAU Course  Procedures NST Serial BPs 126-170/86-98 CBC-WNL CMP-WNL Uric acid-WNL  MDM n/a  Assessment and Plan  [redacted] weeks gestation Gestational HTN-no evidence of preeclampsia Reactive NST  Discharge home  Labetalol 100 mg po bid IOL on 05/17/14 @0730  (per Dr. Seymour BarsLavoie) [facility]  Discussed A/P with Dr. Cherly Hensenousins, agrees   Donette LarryBHAMBRI, Jony Ladnier, N 05/15/2014, 11:09 PM

## 2014-05-15 NOTE — MAU Note (Signed)
Pt reports she was seen in the office today and her B/P was elevated and they did labs and told her to come to the hospital if it was elevated at home. Pt denies headache or other complaints.

## 2014-05-15 NOTE — Discharge Instructions (Signed)
Hypertension During Pregnancy °Hypertension, or high blood pressure, is when there is extra pressure inside your blood vessels that carry blood from the heart to the rest of your body (arteries). It can happen at any time in life, including pregnancy. Hypertension during pregnancy can cause problems for you and your baby. Your baby might not weigh as much as he or she should at birth or might be born early (premature). Very bad cases of hypertension during pregnancy can be life-threatening.  °Different types of hypertension can occur during pregnancy. These include: °· Chronic hypertension. This happens when a woman has hypertension before pregnancy and it continues during pregnancy. °· Gestational hypertension. This is when hypertension develops during pregnancy. °· Preeclampsia or toxemia of pregnancy. This is a very serious type of hypertension that develops only during pregnancy. It affects the whole body and can be very dangerous for both mother and baby.   °Gestational hypertension and preeclampsia usually go away after your baby is born. Your blood pressure will likely stabilize within 6 weeks. Women who have hypertension during pregnancy have a greater chance of developing hypertension later in life or with future pregnancies. °RISK FACTORS °There are certain factors that make it more likely for you to develop hypertension during pregnancy. These include: °· Having hypertension before pregnancy. °· Having hypertension during a previous pregnancy. °· Being overweight. °· Being older than 40 years. °· Being pregnant with more than one baby. °· Having diabetes or kidney problems. °SIGNS AND SYMPTOMS °Chronic and gestational hypertension rarely cause symptoms. Preeclampsia has symptoms, which may include: °· Increased protein in your urine. Your health care provider will check for this at every prenatal visit. °· Swelling of your hands and face. °· Rapid weight gain. °· Headaches. °· Visual changes. °· Being  bothered by light. °· Abdominal pain, especially in the upper right area. °· Chest pain. °· Shortness of breath. °· Increased reflexes. °· Seizures. These occur with a more severe form of preeclampsia, called eclampsia. °DIAGNOSIS  °You may be diagnosed with hypertension during a regular prenatal exam. At each prenatal visit, you may have: °· Your blood pressure checked. °· A urine test to check for protein in your urine. °The type of hypertension you are diagnosed with depends on when you developed it. It also depends on your specific blood pressure reading. °· Developing hypertension before 20 weeks of pregnancy is consistent with chronic hypertension. °· Developing hypertension after 20 weeks of pregnancy is consistent with gestational hypertension. °· Hypertension with increased urinary protein is diagnosed as preeclampsia. °· Blood pressure measurements that stay above 160 systolic or 110 diastolic are a sign of severe preeclampsia. °TREATMENT °Treatment for hypertension during pregnancy varies. Treatment depends on the type of hypertension and how serious it is. °· If you take medicine for chronic hypertension, you may need to switch medicines. °¨ Medicines called ACE inhibitors should not be taken during pregnancy. °¨ Low-dose aspirin may be suggested for women who have risk factors for preeclampsia. °· If you have gestational hypertension, you may need to take a blood pressure medicine that is safe during pregnancy. Your health care provider will recommend the correct medicine. °· If you have severe preeclampsia, you may need to be in the hospital. Health care providers will watch you and your baby very closely. You also may need to take medicine called magnesium sulfate to prevent seizures and lower blood pressure. °· Sometimes, an early delivery is needed. This may be the case if the condition worsens. It would be   done to protect you and your baby. The only cure for preeclampsia is delivery.  Your health  care provider may recommend that you take one low-dose aspirin (81 mg) each day to help prevent high blood pressure during your pregnancy if you are at risk for preeclampsia. You may be at risk for preeclampsia if:  You had preeclampsia or eclampsia during a previous pregnancy.  Your baby did not grow as expected during a previous pregnancy.  You experienced preterm birth with a previous pregnancy.  You experienced a separation of the placenta from the uterus (placental abruption) during a previous pregnancy.  You experienced the loss of your baby during a previous pregnancy.  You are pregnant with more than one baby.  You have other medical conditions, such as diabetes or an autoimmune disease. HOME CARE INSTRUCTIONS  Schedule and keep all of your regular prenatal care appointments. This is important.  Take medicines only as directed by your health care provider. Tell your health care provider about all medicines you take.  Eat as little salt as possible.  Get regular exercise.  Do not drink alcohol.  Do not use tobacco products.  Do not drink products with caffeine.  Lie on your left side when resting. SEEK IMMEDIATE MEDICAL CARE IF:  You have severe abdominal pain.  You have sudden swelling in your hands, ankles, or face.  You gain 4 pounds (1.8 kg) or more in 1 week.  You vomit repeatedly.  You have vaginal bleeding.  You do not feel your baby moving as much.  You have a headache.  You have blurred or double vision.  You have muscle twitching or spasms.  You have shortness of breath.  You have blue fingernails or lips.  You have blood in your urine. MAKE SURE YOU:  Understand these instructions.  Will watch your condition.  Will get help right away if you are not doing well or get worse. Document Released: 02/23/2011 Document Revised: 10/22/2013 Document Reviewed: 01/04/2013 [facility] Patient Information 2015 [place], [place]. This information is not  intended to replace advice given to you by your health care provider. Make sure you discuss any questions you have with your health care provider.   May use evening primrose oil- 1 capsule by mouth in the morning and 1 vaginally at bedtime

## 2014-05-17 ENCOUNTER — Inpatient Hospital Stay (HOSPITAL_COMMUNITY)
Admission: RE | Admit: 2014-05-17 | Discharge: 2014-05-18 | DRG: 781 | Disposition: A | Discharge status: Home / Self Care | Payer: 59 | Source: Ambulatory Visit | Attending: Obstetrics & Gynecology | Admitting: Obstetrics & Gynecology

## 2014-05-17 ENCOUNTER — Encounter (HOSPITAL_COMMUNITY): Payer: Self-pay

## 2014-05-17 DIAGNOSIS — Z3A37 37 weeks gestation of pregnancy: Secondary | ICD-10-CM | POA: Diagnosis present

## 2014-05-17 DIAGNOSIS — O99613 Diseases of the digestive system complicating pregnancy, third trimester: Secondary | ICD-10-CM | POA: Diagnosis present

## 2014-05-17 DIAGNOSIS — K219 Gastro-esophageal reflux disease without esophagitis: Secondary | ICD-10-CM | POA: Diagnosis present

## 2014-05-17 DIAGNOSIS — O09523 Supervision of elderly multigravida, third trimester: Secondary | ICD-10-CM

## 2014-05-17 DIAGNOSIS — Z87891 Personal history of nicotine dependence: Secondary | ICD-10-CM

## 2014-05-17 DIAGNOSIS — R1319 Other dysphagia: Secondary | ICD-10-CM | POA: Diagnosis present

## 2014-05-17 DIAGNOSIS — Z3490 Encounter for supervision of normal pregnancy, unspecified, unspecified trimester: Secondary | ICD-10-CM

## 2014-05-17 DIAGNOSIS — K589 Irritable bowel syndrome without diarrhea: Secondary | ICD-10-CM | POA: Diagnosis present

## 2014-05-17 DIAGNOSIS — O133 Gestational [pregnancy-induced] hypertension without significant proteinuria, third trimester: Principal | ICD-10-CM | POA: Diagnosis present

## 2014-05-17 LAB — COMPREHENSIVE METABOLIC PANEL
ALBUMIN: 2.6 g/dL — AB (ref 3.5–5.2)
ALT: 13 U/L (ref 0–35)
AST: 20 U/L (ref 0–37)
Alkaline Phosphatase: 115 U/L (ref 39–117)
Anion gap: 16 — ABNORMAL HIGH (ref 5–15)
BILIRUBIN TOTAL: 0.2 mg/dL — AB (ref 0.3–1.2)
BUN: 4 mg/dL — ABNORMAL LOW (ref 6–23)
CALCIUM: 8.7 mg/dL (ref 8.4–10.5)
CHLORIDE: 102 meq/L (ref 96–112)
CO2: 19 meq/L (ref 19–32)
CREATININE: 0.63 mg/dL (ref 0.50–1.10)
GFR calc Af Amer: >90 mL/min (ref >90)
GFR calc non Af Amer: >90 mL/min (ref >90)
Glucose, Bld: 114 mg/dL — ABNORMAL HIGH (ref 70–99)
Potassium: 3.7 mEq/L (ref 3.7–5.3)
SODIUM: 137 meq/L (ref 137–147)
Total Protein: 5.9 g/dL — ABNORMAL LOW (ref 6.0–8.3)

## 2014-05-17 LAB — CBC
HCT: 31.8 % — ABNORMAL LOW (ref 36.0–46.0)
HEMOGLOBIN: 10.5 g/dL — AB (ref 12.0–15.0)
MCH: 29.9 pg (ref 26.0–34.0)
MCHC: 33 g/dL (ref 30.0–36.0)
MCV: 90.6 fL (ref 78.0–100.0)
PLATELETS: 367 10*3/uL (ref 150–400)
RBC: 3.51 MIL/uL — AB (ref 3.87–5.11)
RDW: 13.7 % (ref 11.5–15.5)
WBC: 8.7 10*3/uL (ref 4.0–10.5)

## 2014-05-17 LAB — ABO/RH: ABO/RH(D): O POS

## 2014-05-17 LAB — TYPE AND SCREEN
ABO/RH(D): O POS
ANTIBODY SCREEN: NEGATIVE

## 2014-05-17 LAB — LACTATE DEHYDROGENASE: LDH: 174 U/L (ref 94–250)

## 2014-05-17 LAB — URIC ACID: URIC ACID, SERUM: 4.3 mg/dL (ref 2.4–7.0)

## 2014-05-17 LAB — RPR

## 2014-05-17 MED ORDER — FENTANYL 2.5 MCG/ML BUPIVACAINE 1/10 % EPIDURAL INFUSION (WH - ANES): 14.0000 mL/h | INTRAMUSCULAR | Status: DC | PRN

## 2014-05-17 MED ORDER — LIDOCAINE HCL (PF) 1 % IJ SOLN: 30.0000 mL | INTRAMUSCULAR | Status: DC | PRN

## 2014-05-17 MED ORDER — FAMOTIDINE 20 MG PO TABS
20.0000 mg | ORAL_TABLET | Freq: Two times a day (BID) | ORAL | Status: DC
  Administered 2014-05-17: 20 mg via ORAL
  Filled 2014-05-17: qty 1

## 2014-05-17 MED ORDER — OXYTOCIN 40 UNITS IN LACTATED RINGERS INFUSION - SIMPLE MED: 1.0000 m[IU]/min | INTRAVENOUS | Status: DC

## 2014-05-17 MED ORDER — LACTATED RINGERS IV SOLN
INTRAVENOUS | Status: DC
  Administered 2014-05-17 (×3): via INTRAVENOUS

## 2014-05-17 MED ORDER — EPHEDRINE 5 MG/ML INJ: 10.0000 mg | INTRAVENOUS | Status: DC | PRN

## 2014-05-17 MED ORDER — OXYTOCIN BOLUS FROM INFUSION: 500.0000 mL | INTRAVENOUS | Status: DC

## 2014-05-17 MED ORDER — PHENYLEPHRINE 40 MCG/ML (10ML) SYRINGE FOR IV PUSH (FOR BLOOD PRESSURE SUPPORT)
80.0000 ug | PREFILLED_SYRINGE | INTRAVENOUS | Status: DC | PRN
Start: 2014-05-17 — End: 2014-05-18

## 2014-05-17 MED ORDER — MISOPROSTOL 25 MCG QUARTER TABLET
25.0000 ug | ORAL_TABLET | ORAL | Status: DC | PRN
  Administered 2014-05-17 (×2): 25 ug via VAGINAL
  Filled 2014-05-17 (×2): qty 0.25

## 2014-05-17 MED ORDER — OXYCODONE-ACETAMINOPHEN 5-325 MG PO TABS: 1.0000 | ORAL_TABLET | ORAL | Status: DC | PRN

## 2014-05-17 MED ORDER — ONDANSETRON HCL 4 MG/2ML IJ SOLN: 4.0000 mg | Freq: Four times a day (QID) | INTRAMUSCULAR | Status: DC | PRN

## 2014-05-17 MED ORDER — PENICILLIN G POTASSIUM 5000000 UNITS IJ SOLR
5.0000 10*6.[IU] | Freq: Once | INTRAVENOUS | Status: AC
  Administered 2014-05-17: 5 10*6.[IU] via INTRAVENOUS
  Filled 2014-05-17: qty 5

## 2014-05-17 MED ORDER — OXYCODONE-ACETAMINOPHEN 5-325 MG PO TABS: 2.0000 | ORAL_TABLET | ORAL | Status: DC | PRN

## 2014-05-17 MED ORDER — ACETAMINOPHEN 325 MG PO TABS
650.0000 mg | ORAL_TABLET | ORAL | Status: DC | PRN
  Administered 2014-05-18: 650 mg via ORAL
  Filled 2014-05-17: qty 2

## 2014-05-17 MED ORDER — FLEET ENEMA 7-19 GM/118ML RE ENEM: 1.0000 | ENEMA | RECTAL | Status: DC | PRN

## 2014-05-17 MED ORDER — TERBUTALINE SULFATE 1 MG/ML IJ SOLN: 0.2500 mg | Freq: Once | INTRAMUSCULAR | Status: AC | PRN

## 2014-05-17 MED ORDER — OXYTOCIN 40 UNITS IN LACTATED RINGERS INFUSION - SIMPLE MED: 62.5000 mL/h | INTRAVENOUS | Status: DC

## 2014-05-17 MED ORDER — LACTATED RINGERS IV SOLN: 500.0000 mL | INTRAVENOUS | Status: DC | PRN

## 2014-05-17 MED ORDER — DEXTROSE 5 % IV SOLN
2.5000 10*6.[IU] | INTRAVENOUS | Status: DC
  Administered 2014-05-17 – 2014-05-18 (×4): 2.5 10*6.[IU] via INTRAVENOUS
  Filled 2014-05-17 (×7): qty 2.5

## 2014-05-17 MED ORDER — LACTATED RINGERS IV SOLN: 500.0000 mL | Freq: Once | INTRAVENOUS | Status: DC

## 2014-05-17 MED ORDER — DIPHENHYDRAMINE HCL 50 MG/ML IJ SOLN: 12.5000 mg | INTRAMUSCULAR | Status: DC | PRN

## 2014-05-17 MED ORDER — CITRIC ACID-SODIUM CITRATE 334-500 MG/5ML PO SOLN: 30.0000 mL | ORAL | Status: DC | PRN

## 2014-05-17 NOTE — H&P (Signed)
Brianna GroutStephanie T Davis is a 38 y.o. female G2P0010 3363w6d presenting for Induction for PIH on Labetalol.  HPP/HPI:  Good FMs.  Mild irregular UCs.  No AF leak.  No vaginal bleeding.  No PECs, except intermittent mild H/A.  Started on Labetalol 11/25th for BPs 150-160's/90-100's.  PIH labs wnl.  OB History    Gravida Para Term Preterm AB TAB SAB Ectopic Multiple Living   2    1  1         Past Medical History  Diagnosis Date  . Anxiety   . IBS (irritable bowel syndrome)   . S/P endoscopy April 2012    Dr. Jena Davis: friability at GE junction, negative Barrett's orH.pylori, diffuse gastric petechia  . Nephrolithiasis   . Migraine   . Degenerative disc disease    Past Surgical History  Procedure Laterality Date  . Ileocolonoscopy  12/2007    Friable anal canal.  . Esophagogastroduodenoscopy  09/30/2010    ZOX:[id number]:Small hiatal hernia/Friability at the gastroesophageal junction with undulating Z-line versus short segment Barrett esophagus s/p bx  . Laparoscopy N/A 04/03/2013    Procedure: LAPAROSCOPY DIAGNOSTIC;  Surgeon: Brianna Brianna Hellena Pridgen, MD;  Location: [facility];  Service: Gynecology;  Laterality: N/A;  . Chromopertubation Bilateral 04/03/2013    Procedure: CHROMOPERTUBATION with Methylene Blue, with patent right tube and proximal obstruction of left tube;  Surgeon: Brianna Brianna Shon Indelicato, MD;  Location: [facility];  Service: Gynecology;  Laterality: Bilateral;  . Robotic assisted laparoscopic lysis of adhesion N/A 04/03/2013    Procedure: ROBOTIC ASSISTED LAPAROSCOPIC LYSIS OF ADHESION; LYSIS OF ENDOMETRIOSIS ;  Surgeon: Brianna Brianna Brianna Vallier, MD;  Location: [facility];  Service: Gynecology;  Laterality: N/A;   Family History: family history includes GI problems in her sister; Irritable bowel syndrome in her mother. There is no history of Colon cancer, Inflammatory bowel disease, or Liver disease. Social History:  reports that she has quit smoking. Her smoking use included Cigarettes. She has a 14 pack-year smoking  history. She has never used smokeless tobacco. She reports that she does not drink alcohol or use illicit drugs.  Allergies  Allergen Reactions  . Dilaudid [Hydromorphone Hcl] Nausea And Vomiting    Severe nausea and vomiting      Blood pressure 136/85, pulse 88, temperature 97.8 F (36.6 C), temperature source Oral, resp. rate 20, height 5\' 5"  (1.651 m), weight 90.719 kg (200 lb), last menstrual period 06/29/2013. Exam Physical Exam   VE this am with nurse unchanged:  Closed/40%/Vtx/High  M. Intact UC mild and irregular FHR 120-130's good variability, accelerations present, no deceleration  PIH labs today wnl.  HPP:  Patient Active Problem List   Diagnosis Date Noted  . Pregnancy 05/17/2014  . Gestational hypertension w/o significant proteinuria in 3rd trimester 05/15/2014  . Acute pyelonephritis 09/11/2012  . GERD (gastroesophageal reflux disease) 09/28/2010  . Epigastric pain 09/28/2010  . Dysphagia 09/28/2010  . IBS (irritable bowel syndrome) 09/28/2010  . Esophageal dysphagia 09/28/2010    Prenatal labs: ABO, Rh: --/--/O POS (11/27 0825) Antibody: NEG (11/27 0822) Rubella:  Immune RPR: NON REAC (11/27 0822)  HBsAg:  NR HIV:  NR Genetic testing: Quad test wnl US anato: wnl, low lying placenta, resolved in 3rd trimester 1 hr GTT: abnormal.  3 hr GTT wnl GBS: Positive (11/25 0000)   Assessment/Plan: AMA G2P0A1 at 37 6/7 wks with PIH on Labetalol.  No evidence of PEC.  Fetal well-being reassuring.  Induction with Cytotec/Pitocin/AROM.  Pen G.  Monitoring.   Brianna Davis,Brianna 05/17/2014, 1:45 PM

## 2014-05-18 LAB — COMPREHENSIVE METABOLIC PANEL
ALBUMIN: 2.5 g/dL — AB (ref 3.5–5.2)
ALT: 14 U/L (ref 0–35)
AST: 20 U/L (ref 0–37)
Alkaline Phosphatase: 113 U/L (ref 39–117)
Anion gap: 12 (ref 5–15)
BUN: 4 mg/dL — ABNORMAL LOW (ref 6–23)
CALCIUM: 8.5 mg/dL (ref 8.4–10.5)
CO2: 21 mEq/L (ref 19–32)
Chloride: 103 mEq/L (ref 96–112)
Creatinine, Ser: 0.59 mg/dL (ref 0.50–1.10)
GFR calc Af Amer: >90 mL/min (ref >90)
GFR calc non Af Amer: >90 mL/min (ref >90)
Glucose, Bld: 92 mg/dL (ref 70–99)
POTASSIUM: 3.4 meq/L — AB (ref 3.7–5.3)
Sodium: 136 mEq/L — ABNORMAL LOW (ref 137–147)
TOTAL PROTEIN: 5.9 g/dL — AB (ref 6.0–8.3)
Total Bilirubin: 0.3 mg/dL (ref 0.3–1.2)

## 2014-05-18 LAB — CBC
HCT: 31 % — ABNORMAL LOW (ref 36.0–46.0)
Hemoglobin: 10.4 g/dL — ABNORMAL LOW (ref 12.0–15.0)
MCH: 30.4 pg (ref 26.0–34.0)
MCHC: 33.5 g/dL (ref 30.0–36.0)
MCV: 90.6 fL (ref 78.0–100.0)
PLATELETS: 312 10*3/uL (ref 150–400)
RBC: 3.42 MIL/uL — ABNORMAL LOW (ref 3.87–5.11)
RDW: 13.9 % (ref 11.5–15.5)
WBC: 6.6 10*3/uL (ref 4.0–10.5)

## 2014-05-18 LAB — LACTATE DEHYDROGENASE: LDH: 146 U/L (ref 94–250)

## 2014-05-18 LAB — URIC ACID: URIC ACID, SERUM: 3.2 mg/dL (ref 2.4–7.0)

## 2014-05-18 NOTE — Discharge Instructions (Signed)

## 2014-05-18 NOTE — Progress Notes (Signed)
Discharge instructions reviewed with patient. Instructed patient on signs and symptoms of pre-eclampsia. Patient receptive to instructions.

## 2014-05-18 NOTE — Progress Notes (Signed)
Subjective: Doing well, pain none, UCs rare, mild  Anesthesia none   Objective: BP 131/75 mmHg  Pulse 88  Temp(Src) 98.1 F (36.7 C) (Oral)  Resp 20  Ht 5\' 5"  (1.651 m)  Wt 90.719 kg (200 lb)  BMI 33.28 kg/m2  LMP 06/29/2013   FHT:  FHR: 140's bpm, variability: moderate,  accelerations:  Present,  decelerations:  Absent UC:   Rare, mild VE:   Dilation: Closed Effacement (%): 40 Station: -2 Exam by:: Dr. Seymour BarsLavoie   Assessment / Plan: Induction for PIH at 38 wks, but BP completely normalized overnight.  PIH labs wnl.  Unfavorable cervix.  Decision to d/c home.  Bed rest at home.  Monitor BP.  PEC Sx discussed.  F/U with me in 2-3 days.    Fetal Wellbeing:  Category I Pain Control:  N/A  Anticipated MOD:  D/C home  Kelby Lotspeich,MARIE-LYNE 05/18/2014, 7:32 AM

## 2014-05-18 NOTE — Discharge Summary (Signed)
  ANTENATAL DISCHARGE SUMMARY  Patient ID: Brianna Davis MRN: [id number] DOB/AGE: Feb 27, 1976 38 y.o.  Admit date: 05/17/2014 Discharge date: 05/18/2014  [facility]  Discharge Diagnoses: Normalized BP, no PEC         Discharged Condition: good  Hospital Course: Normalized BP with bed rest.  PIH labs wnl.  FHR Cat. 1.  Unfavorable cervix  Consults: None and MFM  Treatments: Cervidil induction attempted  Disposition: home     Medication List    TAKE these medications        acetaminophen 500 MG tablet  Commonly known as:  TYLENOL  Take 500 mg by mouth every 6 (six) hours as needed for headache.     calcium carbonate 500 MG chewable tablet  Commonly known as:  TUMS - dosed in mg elemental calcium  Chew 1 tablet by mouth 4 (four) times daily as needed for indigestion or heartburn.     eucerin lotion  Apply 1 Bottle topically as needed (For eczema.).     labetalol 100 MG tablet  Commonly known as:  NORMODYNE  Take 1 tablet (100 mg total) by mouth 2 (two) times daily.     prenatal multivitamin Tabs tablet  Take 1 tablet by mouth at bedtime.     ranitidine 150 MG tablet  Commonly known as:  ZANTAC  Take 150 mg by mouth 2 (two) times daily as needed for heartburn.           Follow-up Information    Follow up with Ayerim Berquist,MARIE-LYNE, MD In 2 days.   Specialty:  Obstetrics and Gynecology   Contact information:   [address] [place] [address] [phone]       Signed: Genia DelLAVOIE,MARIE-LYNE, MD MD 05/18/2014, 7:39 AM

## 2014-06-06 ENCOUNTER — Encounter (HOSPITAL_COMMUNITY): Payer: Self-pay | Admitting: Obstetrics

## 2014-06-06 ENCOUNTER — Other Ambulatory Visit: Payer: Self-pay | Admitting: Obstetrics & Gynecology

## 2014-06-06 ENCOUNTER — Inpatient Hospital Stay (HOSPITAL_COMMUNITY)
Admission: AD | Admit: 2014-06-06 | Discharge: 2014-06-10 | DRG: 765 | Disposition: A | Discharge status: Home / Self Care | Payer: 59 | Source: Ambulatory Visit | Attending: Obstetrics & Gynecology | Admitting: Obstetrics & Gynecology

## 2014-06-06 DIAGNOSIS — O09523 Supervision of elderly multigravida, third trimester: Secondary | ICD-10-CM

## 2014-06-06 DIAGNOSIS — D62 Acute posthemorrhagic anemia: Secondary | ICD-10-CM | POA: Diagnosis not present

## 2014-06-06 DIAGNOSIS — Z9889 Other specified postprocedural states: Secondary | ICD-10-CM

## 2014-06-06 DIAGNOSIS — Z3A41 41 weeks gestation of pregnancy: Secondary | ICD-10-CM | POA: Diagnosis present

## 2014-06-06 DIAGNOSIS — Z3483 Encounter for supervision of other normal pregnancy, third trimester: Secondary | ICD-10-CM | POA: Diagnosis present

## 2014-06-06 DIAGNOSIS — O480 Post-term pregnancy: Secondary | ICD-10-CM | POA: Diagnosis present

## 2014-06-06 DIAGNOSIS — O99824 Streptococcus B carrier state complicating childbirth: Secondary | ICD-10-CM | POA: Diagnosis present

## 2014-06-06 DIAGNOSIS — O133 Gestational [pregnancy-induced] hypertension without significant proteinuria, third trimester: Secondary | ICD-10-CM | POA: Diagnosis present

## 2014-06-06 HISTORY — DX: Other intervertebral disc degeneration, lumbar region: M51.36

## 2014-06-06 HISTORY — DX: Other intervertebral disc degeneration, lumbar region without mention of lumbar back pain or lower extremity pain: M51.369

## 2014-06-06 LAB — CBC
HEMATOCRIT: 32 % — AB (ref 36.0–46.0)
HEMOGLOBIN: 10.6 g/dL — AB (ref 12.0–15.0)
MCH: 29.3 pg (ref 26.0–34.0)
MCHC: 33.1 g/dL (ref 30.0–36.0)
MCV: 88.4 fL (ref 78.0–100.0)
Platelets: 354 10*3/uL (ref 150–400)
RBC: 3.62 MIL/uL — ABNORMAL LOW (ref 3.87–5.11)
RDW: 13.9 % (ref 11.5–15.5)
WBC: 7.8 10*3/uL (ref 4.0–10.5)

## 2014-06-06 LAB — TYPE AND SCREEN
ABO/RH(D): O POS
Antibody Screen: NEGATIVE

## 2014-06-06 MED ORDER — LABETALOL HCL 100 MG PO TABS
100.0000 mg | ORAL_TABLET | Freq: Two times a day (BID) | ORAL | Status: DC
  Administered 2014-06-06 – 2014-06-07 (×2): 100 mg via ORAL
  Filled 2014-06-06 (×4): qty 1

## 2014-06-06 MED ORDER — ZOLPIDEM TARTRATE 5 MG PO TABS: 5.0000 mg | ORAL_TABLET | Freq: Every evening | ORAL | Status: DC | PRN

## 2014-06-06 MED ORDER — PENICILLIN G POTASSIUM 5000000 UNITS IJ SOLR
2.5000 10*6.[IU] | INTRAVENOUS | Status: DC
  Administered 2014-06-07 (×4): 2.5 10*6.[IU] via INTRAVENOUS
  Filled 2014-06-06 (×5): qty 2.5

## 2014-06-06 MED ORDER — OXYTOCIN 40 UNITS IN LACTATED RINGERS INFUSION - SIMPLE MED
1.0000 m[IU]/min | INTRAVENOUS | Status: DC
  Administered 2014-06-07: 2 m[IU]/min via INTRAVENOUS
  Filled 2014-06-06: qty 1000

## 2014-06-06 MED ORDER — ACETAMINOPHEN 325 MG PO TABS
650.0000 mg | ORAL_TABLET | ORAL | Status: DC | PRN
  Administered 2014-06-07 (×2): 650 mg via ORAL
  Filled 2014-06-06 (×2): qty 2

## 2014-06-06 MED ORDER — ONDANSETRON HCL 4 MG/2ML IJ SOLN
4.0000 mg | Freq: Four times a day (QID) | INTRAMUSCULAR | Status: DC | PRN
  Administered 2014-06-07 (×2): 4 mg via INTRAVENOUS
  Filled 2014-06-06 (×2): qty 2

## 2014-06-06 MED ORDER — MISOPROSTOL 25 MCG QUARTER TABLET
25.0000 ug | ORAL_TABLET | ORAL | Status: DC | PRN
  Administered 2014-06-06 – 2014-06-07 (×2): 25 ug via VAGINAL
  Filled 2014-06-06 (×2): qty 0.25

## 2014-06-06 MED ORDER — OXYTOCIN BOLUS FROM INFUSION: 500.0000 mL | INTRAVENOUS | Status: DC

## 2014-06-06 MED ORDER — CITRIC ACID-SODIUM CITRATE 334-500 MG/5ML PO SOLN
30.0000 mL | ORAL | Status: DC | PRN
  Administered 2014-06-07 (×2): 30 mL via ORAL
  Filled 2014-06-06 (×2): qty 15

## 2014-06-06 MED ORDER — TERBUTALINE SULFATE 1 MG/ML IJ SOLN: 0.2500 mg | Freq: Once | INTRAMUSCULAR | Status: AC | PRN

## 2014-06-06 MED ORDER — OXYTOCIN 40 UNITS IN LACTATED RINGERS INFUSION - SIMPLE MED: 62.5000 mL/h | INTRAVENOUS | Status: DC

## 2014-06-06 MED ORDER — PENICILLIN G POTASSIUM 5000000 UNITS IJ SOLR
5.0000 10*6.[IU] | Freq: Once | INTRAVENOUS | Status: DC
  Filled 2014-06-06: qty 5

## 2014-06-06 MED ORDER — LACTATED RINGERS IV SOLN: 500.0000 mL | INTRAVENOUS | Status: DC | PRN

## 2014-06-06 MED ORDER — LACTATED RINGERS IV SOLN
INTRAVENOUS | Status: DC
  Administered 2014-06-06 – 2014-06-07 (×2): via INTRAVENOUS

## 2014-06-06 MED ORDER — LIDOCAINE HCL (PF) 1 % IJ SOLN
30.0000 mL | INTRAMUSCULAR | Status: AC | PRN
  Administered 2014-06-07: 4 mL via SUBCUTANEOUS
  Administered 2014-06-07: 6 mL via SUBCUTANEOUS

## 2014-06-07 ENCOUNTER — Encounter (HOSPITAL_COMMUNITY)
Admission: AD | Disposition: A | Discharge status: Home / Self Care | Payer: Self-pay | Source: Ambulatory Visit | Attending: Obstetrics & Gynecology

## 2014-06-07 ENCOUNTER — Inpatient Hospital Stay (HOSPITAL_COMMUNITY): Payer: 59 | Admitting: Anesthesiology

## 2014-06-07 LAB — RPR

## 2014-06-07 SURGERY — Surgical Case
Anesthesia: Epidural | Site: Abdomen

## 2014-06-07 MED ORDER — KETOROLAC TROMETHAMINE 30 MG/ML IJ SOLN
30.0000 mg | Freq: Four times a day (QID) | INTRAMUSCULAR | Status: DC | PRN
  Administered 2014-06-08: 30 mg via INTRAMUSCULAR

## 2014-06-07 MED ORDER — SODIUM BICARBONATE 8.4 % IV SOLN
INTRAVENOUS | Status: DC | PRN
  Administered 2014-06-07 (×2): 5 mL via EPIDURAL

## 2014-06-07 MED ORDER — MORPHINE SULFATE 0.5 MG/ML IJ SOLN
INTRAMUSCULAR | Status: AC
  Filled 2014-06-07: qty 10

## 2014-06-07 MED ORDER — DIPHENHYDRAMINE HCL 50 MG/ML IJ SOLN: 12.5000 mg | INTRAMUSCULAR | Status: DC | PRN

## 2014-06-07 MED ORDER — PENICILLIN G POTASSIUM 5000000 UNITS IJ SOLR
5.0000 10*6.[IU] | Freq: Once | INTRAVENOUS | Status: AC
  Administered 2014-06-07: 5 10*6.[IU] via INTRAVENOUS
  Filled 2014-06-07: qty 5

## 2014-06-07 MED ORDER — SODIUM CHLORIDE 0.9 % IV SOLN
3.0000 g | Freq: Four times a day (QID) | INTRAVENOUS | Status: DC
  Administered 2014-06-07: 3 g via INTRAVENOUS
  Filled 2014-06-07 (×4): qty 3

## 2014-06-07 MED ORDER — FENTANYL 2.5 MCG/ML BUPIVACAINE 1/10 % EPIDURAL INFUSION (WH - ANES): 14.0000 mL/h | INTRAMUSCULAR | Status: DC | PRN

## 2014-06-07 MED ORDER — FENTANYL 2.5 MCG/ML BUPIVACAINE 1/10 % EPIDURAL INFUSION (WH - ANES)
14.0000 mL/h | INTRAMUSCULAR | Status: DC | PRN
  Administered 2014-06-07 (×4): 14 mL/h via EPIDURAL
  Filled 2014-06-07 (×3): qty 125

## 2014-06-07 MED ORDER — BUPIVACAINE HCL (PF) 0.25 % IJ SOLN
INTRAMUSCULAR | Status: AC
  Filled 2014-06-07: qty 10

## 2014-06-07 MED ORDER — NALBUPHINE HCL 10 MG/ML IJ SOLN
5.0000 mg | Freq: Four times a day (QID) | INTRAMUSCULAR | Status: DC | PRN
  Administered 2014-06-07: 5 mg via SUBCUTANEOUS
  Filled 2014-06-07: qty 1

## 2014-06-07 MED ORDER — NALBUPHINE HCL 10 MG/ML IJ SOLN
5.0000 mg | INTRAMUSCULAR | Status: DC | PRN
  Administered 2014-06-07: 5 mg via INTRAVENOUS
  Filled 2014-06-07: qty 1

## 2014-06-07 MED ORDER — KETOROLAC TROMETHAMINE 30 MG/ML IJ SOLN: 30.0000 mg | Freq: Four times a day (QID) | INTRAMUSCULAR | Status: DC | PRN

## 2014-06-07 MED ORDER — CEFAZOLIN SODIUM-DEXTROSE 2-3 GM-% IV SOLR
2.0000 g | Freq: Once | INTRAVENOUS | Status: DC
  Filled 2014-06-07: qty 50

## 2014-06-07 MED ORDER — EPHEDRINE 5 MG/ML INJ: 10.0000 mg | INTRAVENOUS | Status: DC | PRN

## 2014-06-07 MED ORDER — LACTATED RINGERS IV SOLN
500.0000 mL | Freq: Once | INTRAVENOUS | Status: AC
  Administered 2014-06-07: 500 mL via INTRAVENOUS

## 2014-06-07 MED ORDER — PHENYLEPHRINE 40 MCG/ML (10ML) SYRINGE FOR IV PUSH (FOR BLOOD PRESSURE SUPPORT)
80.0000 ug | PREFILLED_SYRINGE | INTRAVENOUS | Status: DC | PRN
  Filled 2014-06-07: qty 10

## 2014-06-07 MED ORDER — PHENYLEPHRINE 40 MCG/ML (10ML) SYRINGE FOR IV PUSH (FOR BLOOD PRESSURE SUPPORT): 80.0000 ug | PREFILLED_SYRINGE | INTRAVENOUS | Status: DC | PRN

## 2014-06-07 SURGICAL SUPPLY — 41 items
CLAMP CORD UMBIL (MISCELLANEOUS) IMPLANT
CLOTH BEACON ORANGE TIMEOUT ST (SAFETY) ×2 IMPLANT
CONTAINER PREFILL 10% NBF 15ML (MISCELLANEOUS) IMPLANT
DERMABOND ADHESIVE PROPEN (GAUZE/BANDAGES/DRESSINGS) ×1
DERMABOND ADVANCED .7 DNX6 (GAUZE/BANDAGES/DRESSINGS) ×1 IMPLANT
DRAPE SHEET LG 3/4 BI-LAMINATE (DRAPES) IMPLANT
DRSG OPSITE POSTOP 4X10 (GAUZE/BANDAGES/DRESSINGS) ×2 IMPLANT
DURAPREP 26ML APPLICATOR (WOUND CARE) ×2 IMPLANT
ELECT REM PT RETURN 9FT ADLT (ELECTROSURGICAL) ×2
ELECTRODE REM PT RTRN 9FT ADLT (ELECTROSURGICAL) ×1 IMPLANT
EXTRACTOR VACUUM M CUP 4 TUBE (SUCTIONS) IMPLANT
GLOVE BIO SURGEON STRL SZ 6.5 (GLOVE) ×2 IMPLANT
GLOVE BIOGEL PI IND STRL 7.0 (GLOVE) ×1 IMPLANT
GLOVE BIOGEL PI INDICATOR 7.0 (GLOVE) ×1
GOWN STRL REUS W/TWL LRG LVL3 (GOWN DISPOSABLE) ×4 IMPLANT
HEMOSTAT SURGICEL 2X3 (HEMOSTASIS) ×4 IMPLANT
KIT ABG SYR 3ML LUER SLIP (SYRINGE) IMPLANT
LIQUID BAND (GAUZE/BANDAGES/DRESSINGS) IMPLANT
NEEDLE HYPO 22GX1.5 SAFETY (NEEDLE) ×2 IMPLANT
NEEDLE HYPO 25X5/8 SAFETYGLIDE (NEEDLE) IMPLANT
PACK C SECTION WH (CUSTOM PROCEDURE TRAY) ×2 IMPLANT
PAD OB MATERNITY 4.3X12.25 (PERSONAL CARE ITEMS) ×2 IMPLANT
RTRCTR C-SECT PINK 25CM LRG (MISCELLANEOUS) ×2 IMPLANT
STAPLER VISISTAT 35W (STAPLE) IMPLANT
SUT MNCRL AB 3-0 PS2 27 (SUTURE) ×2 IMPLANT
SUT PLAIN 0 NONE (SUTURE) IMPLANT
SUT PLAIN 2 0 (SUTURE) ×1
SUT PLAIN ABS 2-0 CT1 27XMFL (SUTURE) ×1 IMPLANT
SUT VIC AB 0 CT1 27 (SUTURE) ×2
SUT VIC AB 0 CT1 27XBRD ANBCTR (SUTURE) ×2 IMPLANT
SUT VIC AB 0 CTX 36 (SUTURE) ×2
SUT VIC AB 0 CTX36XBRD ANBCTRL (SUTURE) ×2 IMPLANT
SUT VIC AB 2-0 CT1 27 (SUTURE) ×1
SUT VIC AB 2-0 CT1 TAPERPNT 27 (SUTURE) ×1 IMPLANT
SUT VIC AB 3-0 SH 27 (SUTURE) ×1
SUT VIC AB 3-0 SH 27X BRD (SUTURE) ×1 IMPLANT
SUT VIC AB 4-0 PS2 27 (SUTURE) ×2 IMPLANT
SYR CONTROL 10ML LL (SYRINGE) ×2 IMPLANT
TOWEL OR 17X24 6PK STRL BLUE (TOWEL DISPOSABLE) ×2 IMPLANT
TRAY FOLEY CATH 14FR (SET/KITS/TRAYS/PACK) ×2 IMPLANT
WATER STERILE IRR 1000ML POUR (IV SOLUTION) IMPLANT

## 2014-06-07 NOTE — Anesthesia Preprocedure Evaluation (Signed)
Anesthesia Evaluation  Patient identified by MRN, date of birth, ID band Patient awake    Reviewed: Allergy & Precautions, H&P , Patient's Chart, lab work & pertinent test results  Airway Mallampati: II  TM Distance: >3 FB Neck ROM: full    Dental   Pulmonary former smoker,  breath sounds clear to auscultation        Cardiovascular Rhythm:regular Rate:Normal     Neuro/Psych  Headaches,    GI/Hepatic GERD-  ,  Endo/Other    Renal/GU      Musculoskeletal   Abdominal   Peds  Hematology   Anesthesia Other Findings   Reproductive/Obstetrics (+) Pregnancy                             Anesthesia Physical Anesthesia Plan  ASA: II  Anesthesia Plan: Epidural   Post-op Pain Management:    Induction:   Airway Management Planned:   Additional Equipment:   Intra-op Plan:   Post-operative Plan:   Informed Consent: I have reviewed the patients History and Physical, chart, labs and discussed the procedure including the risks, benefits and alternatives for the proposed anesthesia with the patient or authorized representative who has indicated his/her understanding and acceptance.     Plan Discussed with:   Anesthesia Plan Comments:         Anesthesia Quick Evaluation

## 2014-06-07 NOTE — Progress Notes (Signed)
Subjective: Doing well, pain controled with epidural, UCs q3-4 min  Anesthesia epidural   Objective: BP 107/58 mmHg  Pulse 83  Temp(Src) 99.9 F (37.7 C) (Axillary)  Resp 22  Ht 5\' 5"  (1.651 m)  Wt 204 lb (92.534 kg)  BMI 33.95 kg/m2  LMP 06/29/2013   FHT:  FHR: 150 bpm, variability: minimal ,  accelerations:  Abscent,  decelerations:  Present occasional early decelerations UC:   regular, every 3-4 minutes VE:   Dilation: 6.5 Effacement (%): 90 Station: -2 Exam by:: Lizmary Nader MD   Assessment / Plan: Arrest of progression x >3 hrs in dilation and descent.  FHR monitoring cat 2.  Chorio on Unasyn. Decision to proceed with Urgent C/S.  Surgery and risks reviewed including trauma, hemorrhage, infection, DVT/PE, anesthesia Cx.  Pt understands and agrees with plan.  Fetal Wellbeing:  Category II Pain Control:  Epidural  Anticipated MOD:  Urgent C/S  Makhia Vosler,MARIE-LYNE 06/07/2014, 11:46 PM

## 2014-06-07 NOTE — H&P (Signed)
Brianna GroutStephanie T Davis is a 38 y.o. female G2P0010 7030w6d presenting for Induction Postdates/Labile PIH on Labetalol.  OB History    Gravida Para Term Preterm AB TAB SAB Ectopic Multiple Living   2    1  1         Past Medical History  Diagnosis Date  . Anxiety   . IBS (irritable bowel syndrome)   . S/P endoscopy April 2012    Dr. Jena Davis: friability at GE junction, negative Barrett's orH.pylori, diffuse gastric petechia  . Nephrolithiasis   . Migraine   . Degenerative disc disease   . Degenerative disc disease, lumbar    Past Surgical History  Procedure Laterality Date  . Ileocolonoscopy  12/2007    Friable anal canal.  . Esophagogastroduodenoscopy  09/30/2010    UJW:[id number]:Small hiatal hernia/Friability at the gastroesophageal junction with undulating Z-line versus short segment Barrett esophagus s/p bx  . Laparoscopy N/A 04/03/2013    Procedure: LAPAROSCOPY DIAGNOSTIC;  Surgeon: Brianna DelMarie-Lyne Lawson Mahone, MD;  Location: [facility];  Service: Gynecology;  Laterality: N/A;  . Chromopertubation Bilateral 04/03/2013    Procedure: CHROMOPERTUBATION with Methylene Blue, with patent right tube and proximal obstruction of left tube;  Surgeon: Brianna DelMarie-Lyne Lamees Gable, MD;  Location: [facility];  Service: Gynecology;  Laterality: Bilateral;  . Robotic assisted laparoscopic lysis of adhesion N/A 04/03/2013    Procedure: ROBOTIC ASSISTED LAPAROSCOPIC LYSIS OF ADHESION; LYSIS OF ENDOMETRIOSIS ;  Surgeon: Brianna DelMarie-Lyne Lennart Gladish, MD;  Location: [facility];  Service: Gynecology;  Laterality: N/A;   Family History: family history includes GI problems in her sister; Irritable bowel syndrome in her mother. There is no history of Colon cancer, Inflammatory bowel disease, or Liver disease. Social History:  reports that she has quit smoking. Her smoking use included Cigarettes. She has a 14 pack-year smoking history. She has never used smokeless tobacco. She reports that she does not drink alcohol or use illicit drugs.  Allergies  Allergen  Reactions  . Dilaudid [Hydromorphone Hcl] Nausea And Vomiting    Severe nausea and vomiting    Dilation: 1.5 Effacement (%): Thick Station: -2, -3 Exam by:: Brianna Davis  AROM clear AF  Blood pressure 128/85, pulse 84, temperature 98.6 F (37 C), temperature source Oral, resp. rate 18, height 5\' 5"  (1.651 m), weight 204 lb (92.534 kg), last menstrual period 06/29/2013. Exam Physical Exam   FHR 140's accelerations present, good variability, no deceleration UCs +/++ q 3-4 min on Pitocin  HPP:  Patient Active Problem List   Diagnosis Date Noted  . Normal labor 06/06/2014  . Pregnancy 05/17/2014  . Gestational hypertension w/o significant proteinuria in 3rd trimester 05/15/2014  . Acute pyelonephritis 09/11/2012  . GERD (gastroesophageal reflux disease) 09/28/2010  . Epigastric pain 09/28/2010  . Dysphagia 09/28/2010  . IBS (irritable bowel syndrome) 09/28/2010  . Esophageal dysphagia 09/28/2010    Prenatal labs: ABO, Rh: --/--/O POS (12/17 2103) Antibody: NEG (12/17 2103) Rubella:  Immune RPR: NON REAC (12/17 2103)  HBsAg: Negative (05/26 0000)  HIV: Non-reactive (05/26 0000)  Genetic testing: Quad wnl US anato: wnl 1 hr GTT: abnormal, 3 hr GTT: wnl GBS: Positive (11/25 0000)   Assessment/Plan: G2P0A1 40 5/7 wks Induction for Postdates with Labile PIH on Labetalol.  Early labor post Cytotec/Pitocin.  AROM just done.  Fetal well-being reassuring.  GBS pos on Pen G.  Expectant management towards vaginal delivery.  Risks of Urgent C/S previously discussed with patient in office.  Epidural PRN.   Brianna Davis,Brianna Davis 06/07/2014, 8:39 AM

## 2014-06-07 NOTE — Progress Notes (Signed)
Subjective: Doing well, pain controled with epidural, UCs q2 min  Anesthesia epidural   Objective: BP 122/69 mmHg  Pulse 78  Temp(Src) 98.6 F (37 C) (Oral)  Resp 18  Ht 5\' 5"  (1.651 m)  Wt 204 lb (92.534 kg)  BMI 33.95 kg/m2  LMP 06/29/2013   FHT:  FHR: 140's bpm, variability: moderate,  accelerations:  Present,  decelerations:  Absent UC:   regular, every 2 minutes VE:   Dilation: 5 Effacement (%): 80 Station: -2, -1 Exam by:: L. McDaniel rN   Assessment / Plan: Induction of labor due to gestational hypertension and postterm,  progressing well on pitocin  Fetal Wellbeing:  Category I Pain Control:  Epidural  Anticipated MOD:  NSVD  Brianna Davis,Brianna Davis 06/07/2014, 3:32 PM

## 2014-06-07 NOTE — Anesthesia Procedure Notes (Signed)

## 2014-06-08 ENCOUNTER — Encounter (HOSPITAL_COMMUNITY): Payer: Self-pay

## 2014-06-08 DIAGNOSIS — Z9889 Other specified postprocedural states: Secondary | ICD-10-CM

## 2014-06-08 LAB — CBC
HCT: 29.8 % — ABNORMAL LOW (ref 36.0–46.0)
HEMATOCRIT: 27.4 % — AB (ref 36.0–46.0)
HEMOGLOBIN: 9 g/dL — AB (ref 12.0–15.0)
Hemoglobin: 10 g/dL — ABNORMAL LOW (ref 12.0–15.0)
MCH: 29.9 pg (ref 26.0–34.0)
MCH: 29.5 pg (ref 26.0–34.0)
MCHC: 33.6 g/dL (ref 30.0–36.0)
MCHC: 32.8 g/dL (ref 30.0–36.0)
MCV: 89.2 fL (ref 78.0–100.0)
MCV: 89.8 fL (ref 78.0–100.0)
Platelets: 283 10*3/uL (ref 150–400)
Platelets: 304 10*3/uL (ref 150–400)
RBC: 3.34 MIL/uL — ABNORMAL LOW (ref 3.87–5.11)
RBC: 3.05 MIL/uL — ABNORMAL LOW (ref 3.87–5.11)
RDW: 14.1 % (ref 11.5–15.5)
RDW: 14.3 % (ref 11.5–15.5)
WBC: 16.5 10*3/uL — AB (ref 4.0–10.5)
WBC: 16.4 10*3/uL — ABNORMAL HIGH (ref 4.0–10.5)

## 2014-06-08 MED ORDER — MEPERIDINE HCL 25 MG/ML IJ SOLN
INTRAMUSCULAR | Status: DC | PRN
  Administered 2014-06-08 (×2): 12.5 mg via INTRAVENOUS

## 2014-06-08 MED ORDER — IBUPROFEN 600 MG PO TABS: 600.0000 mg | ORAL_TABLET | Freq: Four times a day (QID) | ORAL | Status: DC | PRN

## 2014-06-08 MED ORDER — IBUPROFEN 600 MG PO TABS
600.0000 mg | ORAL_TABLET | Freq: Four times a day (QID) | ORAL | Status: DC
  Administered 2014-06-08 – 2014-06-10 (×9): 600 mg via ORAL
  Filled 2014-06-08 (×9): qty 1

## 2014-06-08 MED ORDER — SODIUM CHLORIDE 0.9 % IJ SOLN: 3.0000 mL | INTRAMUSCULAR | Status: DC | PRN

## 2014-06-08 MED ORDER — ACETAMINOPHEN 500 MG PO TABS
1000.0000 mg | ORAL_TABLET | Freq: Four times a day (QID) | ORAL | Status: AC
  Administered 2014-06-08 (×2): 1000 mg via ORAL
  Filled 2014-06-08 (×4): qty 2

## 2014-06-08 MED ORDER — ONDANSETRON HCL 4 MG/2ML IJ SOLN: 4.0000 mg | INTRAMUSCULAR | Status: DC | PRN

## 2014-06-08 MED ORDER — SCOPOLAMINE 1 MG/3DAYS TD PT72
MEDICATED_PATCH | TRANSDERMAL | Status: DC | PRN
  Administered 2014-06-07: 1 via TRANSDERMAL

## 2014-06-08 MED ORDER — MENTHOL 3 MG MT LOZG: 1.0000 | LOZENGE | OROMUCOSAL | Status: DC | PRN

## 2014-06-08 MED ORDER — FERROUS SULFATE 325 (65 FE) MG PO TABS
325.0000 mg | ORAL_TABLET | Freq: Two times a day (BID) | ORAL | Status: DC
  Administered 2014-06-08 – 2014-06-10 (×5): 325 mg via ORAL
  Filled 2014-06-08 (×5): qty 1

## 2014-06-08 MED ORDER — TETANUS-DIPHTH-ACELL PERTUSSIS 5-2.5-18.5 LF-MCG/0.5 IM SUSP: 0.5000 mL | Freq: Once | INTRAMUSCULAR | Status: DC

## 2014-06-08 MED ORDER — MAGNESIUM HYDROXIDE 400 MG/5ML PO SUSP: 30.0000 mL | ORAL | Status: DC | PRN

## 2014-06-08 MED ORDER — LACTATED RINGERS IV SOLN
INTRAVENOUS | Status: DC
  Administered 2014-06-08 (×2): via INTRAVENOUS

## 2014-06-08 MED ORDER — KETOROLAC TROMETHAMINE 30 MG/ML IJ SOLN: 30.0000 mg | Freq: Once | INTRAMUSCULAR | Status: DC

## 2014-06-08 MED ORDER — ONDANSETRON HCL 4 MG/2ML IJ SOLN
INTRAMUSCULAR | Status: AC
  Filled 2014-06-08: qty 2

## 2014-06-08 MED ORDER — MORPHINE SULFATE (PF) 0.5 MG/ML IJ SOLN
INTRAMUSCULAR | Status: DC | PRN
  Administered 2014-06-08: 1 mg via INTRAVENOUS
  Administered 2014-06-08: 4 mg via EPIDURAL

## 2014-06-08 MED ORDER — OXYCODONE-ACETAMINOPHEN 5-325 MG PO TABS: 2.0000 | ORAL_TABLET | ORAL | Status: DC | PRN

## 2014-06-08 MED ORDER — MEPERIDINE HCL 25 MG/ML IJ SOLN: 6.2500 mg | INTRAMUSCULAR | Status: DC | PRN

## 2014-06-08 MED ORDER — ONDANSETRON HCL 4 MG/2ML IJ SOLN: 4.0000 mg | Freq: Three times a day (TID) | INTRAMUSCULAR | Status: DC | PRN

## 2014-06-08 MED ORDER — PHENYLEPHRINE HCL 10 MG/ML IJ SOLN
INTRAMUSCULAR | Status: DC | PRN
  Administered 2014-06-08: 120 ug via INTRAVENOUS
  Administered 2014-06-08: 80 ug via INTRAVENOUS

## 2014-06-08 MED ORDER — FENTANYL CITRATE 0.05 MG/ML IJ SOLN
25.0000 ug | INTRAMUSCULAR | Status: DC | PRN
  Administered 2014-06-08 (×3): 50 ug via INTRAVENOUS

## 2014-06-08 MED ORDER — NALBUPHINE HCL 10 MG/ML IJ SOLN: 5.0000 mg | INTRAMUSCULAR | Status: DC | PRN

## 2014-06-08 MED ORDER — SIMETHICONE 80 MG PO CHEW: 80.0000 mg | CHEWABLE_TABLET | ORAL | Status: DC | PRN

## 2014-06-08 MED ORDER — KETOROLAC TROMETHAMINE 30 MG/ML IJ SOLN
INTRAMUSCULAR | Status: AC
  Administered 2014-06-08: 30 mg via INTRAMUSCULAR
  Filled 2014-06-08: qty 1

## 2014-06-08 MED ORDER — BUPIVACAINE HCL (PF) 0.25 % IJ SOLN
INTRAMUSCULAR | Status: DC | PRN
  Administered 2014-06-08: 10 mL

## 2014-06-08 MED ORDER — SENNOSIDES-DOCUSATE SODIUM 8.6-50 MG PO TABS
2.0000 | ORAL_TABLET | ORAL | Status: DC
  Administered 2014-06-08 – 2014-06-09 (×2): 2 via ORAL
  Filled 2014-06-08 (×2): qty 2

## 2014-06-08 MED ORDER — OXYTOCIN 10 UNIT/ML IJ SOLN
INTRAMUSCULAR | Status: AC
  Filled 2014-06-08: qty 4

## 2014-06-08 MED ORDER — FENTANYL CITRATE 0.05 MG/ML IJ SOLN
INTRAMUSCULAR | Status: AC
  Filled 2014-06-08: qty 2

## 2014-06-08 MED ORDER — ONDANSETRON HCL 4 MG/2ML IJ SOLN
INTRAMUSCULAR | Status: DC | PRN
  Administered 2014-06-08: 4 mg via INTRAVENOUS

## 2014-06-08 MED ORDER — NALBUPHINE HCL 10 MG/ML IJ SOLN
5.0000 mg | INTRAMUSCULAR | Status: DC | PRN
  Administered 2014-06-08 (×3): 5 mg via INTRAVENOUS
  Filled 2014-06-08 (×3): qty 1

## 2014-06-08 MED ORDER — PHENYLEPHRINE 40 MCG/ML (10ML) SYRINGE FOR IV PUSH (FOR BLOOD PRESSURE SUPPORT)
PREFILLED_SYRINGE | INTRAVENOUS | Status: AC
  Filled 2014-06-08: qty 5

## 2014-06-08 MED ORDER — ONDANSETRON HCL 4 MG PO TABS: 4.0000 mg | ORAL_TABLET | ORAL | Status: DC | PRN

## 2014-06-08 MED ORDER — FENTANYL CITRATE 0.05 MG/ML IJ SOLN
INTRAMUSCULAR | Status: AC
  Administered 2014-06-08: 50 ug via INTRAVENOUS
  Filled 2014-06-08: qty 2

## 2014-06-08 MED ORDER — LANOLIN HYDROUS EX OINT: 1.0000 "application " | TOPICAL_OINTMENT | CUTANEOUS | Status: DC | PRN

## 2014-06-08 MED ORDER — DIPHENHYDRAMINE HCL 50 MG/ML IJ SOLN: 12.5000 mg | INTRAMUSCULAR | Status: DC | PRN

## 2014-06-08 MED ORDER — LACTATED RINGERS IV SOLN
INTRAVENOUS | Status: DC | PRN
  Administered 2014-06-07: via INTRAVENOUS

## 2014-06-08 MED ORDER — NALBUPHINE HCL 10 MG/ML IJ SOLN
5.0000 mg | Freq: Once | INTRAMUSCULAR | Status: AC | PRN
  Administered 2014-06-08: 5 mg via INTRAVENOUS

## 2014-06-08 MED ORDER — NALBUPHINE HCL 10 MG/ML IJ SOLN: 5.0000 mg | Freq: Once | INTRAMUSCULAR | Status: AC | PRN

## 2014-06-08 MED ORDER — MEPERIDINE HCL 25 MG/ML IJ SOLN
INTRAMUSCULAR | Status: AC
Start: 2014-06-08 — End: 2014-06-08
  Filled 2014-06-08: qty 1

## 2014-06-08 MED ORDER — NALOXONE HCL 0.4 MG/ML IJ SOLN: 0.4000 mg | INTRAMUSCULAR | Status: DC | PRN

## 2014-06-08 MED ORDER — OXYTOCIN 40 UNITS IN LACTATED RINGERS INFUSION - SIMPLE MED
62.5000 mL/h | INTRAVENOUS | Status: AC
Start: 2014-06-08 — End: 2014-06-08

## 2014-06-08 MED ORDER — PROMETHAZINE HCL 25 MG/ML IJ SOLN: 6.2500 mg | INTRAMUSCULAR | Status: DC | PRN

## 2014-06-08 MED ORDER — OXYCODONE-ACETAMINOPHEN 5-325 MG PO TABS
1.0000 | ORAL_TABLET | ORAL | Status: DC | PRN
  Administered 2014-06-09 – 2014-06-10 (×4): 1 via ORAL
  Filled 2014-06-08 (×4): qty 1

## 2014-06-08 MED ORDER — CEFAZOLIN SODIUM-DEXTROSE 2-3 GM-% IV SOLR
INTRAVENOUS | Status: DC | PRN
  Administered 2014-06-07: 2 g via INTRAVENOUS

## 2014-06-08 MED ORDER — SCOPOLAMINE 1 MG/3DAYS TD PT72: 1.0000 | MEDICATED_PATCH | Freq: Once | TRANSDERMAL | Status: DC

## 2014-06-08 MED ORDER — SIMETHICONE 80 MG PO CHEW
80.0000 mg | CHEWABLE_TABLET | Freq: Three times a day (TID) | ORAL | Status: DC
  Administered 2014-06-08 – 2014-06-10 (×7): 80 mg via ORAL
  Filled 2014-06-08 (×7): qty 1

## 2014-06-08 MED ORDER — OXYTOCIN 10 UNIT/ML IJ SOLN
40.0000 [IU] | INTRAVENOUS | Status: DC | PRN
  Administered 2014-06-08: 40 [IU] via INTRAVENOUS

## 2014-06-08 MED ORDER — DIBUCAINE 1 % RE OINT: 1.0000 "application " | TOPICAL_OINTMENT | RECTAL | Status: DC | PRN

## 2014-06-08 MED ORDER — MIDAZOLAM HCL 2 MG/2ML IJ SOLN: 0.5000 mg | Freq: Once | INTRAMUSCULAR | Status: DC | PRN

## 2014-06-08 MED ORDER — WITCH HAZEL-GLYCERIN EX PADS: 1.0000 "application " | MEDICATED_PAD | CUTANEOUS | Status: DC | PRN

## 2014-06-08 MED ORDER — CEFAZOLIN SODIUM-DEXTROSE 2-3 GM-% IV SOLR: INTRAVENOUS | Status: DC | PRN

## 2014-06-08 MED ORDER — NALBUPHINE HCL 10 MG/ML IJ SOLN
INTRAMUSCULAR | Status: AC
  Administered 2014-06-08: 5 mg via INTRAVENOUS
  Filled 2014-06-08: qty 1

## 2014-06-08 MED ORDER — SIMETHICONE 80 MG PO CHEW
80.0000 mg | CHEWABLE_TABLET | ORAL | Status: DC
  Administered 2014-06-08 – 2014-06-09 (×2): 80 mg via ORAL
  Filled 2014-06-08 (×2): qty 1

## 2014-06-08 MED ORDER — NALOXONE HCL 1 MG/ML IJ SOLN
1.0000 ug/kg/h | INTRAVENOUS | Status: DC | PRN
  Filled 2014-06-08: qty 2

## 2014-06-08 MED ORDER — ZOLPIDEM TARTRATE 5 MG PO TABS: 5.0000 mg | ORAL_TABLET | Freq: Every evening | ORAL | Status: DC | PRN

## 2014-06-08 MED ORDER — ACETAMINOPHEN 10 MG/ML IV SOLN
1000.0000 mg | Freq: Four times a day (QID) | INTRAVENOUS | Status: DC
  Administered 2014-06-08: 1000 mg via INTRAVENOUS
  Filled 2014-06-08 (×2): qty 100

## 2014-06-08 MED ORDER — PRENATAL MULTIVITAMIN CH
1.0000 | ORAL_TABLET | Freq: Every day | ORAL | Status: DC
  Administered 2014-06-08 – 2014-06-10 (×3): 1 via ORAL
  Filled 2014-06-08 (×3): qty 1

## 2014-06-08 MED ORDER — DIPHENHYDRAMINE HCL 25 MG PO CAPS: 25.0000 mg | ORAL_CAPSULE | Freq: Four times a day (QID) | ORAL | Status: DC | PRN

## 2014-06-08 MED ORDER — DIPHENHYDRAMINE HCL 25 MG PO CAPS: 25.0000 mg | ORAL_CAPSULE | ORAL | Status: DC | PRN

## 2014-06-08 NOTE — Op Note (Signed)
Preoperative diagnosis: Intrauterine pregnancy at 40 weeks and 5 days                                             Labile PIH                                            Arrest of progression in active labor  Post operative diagnosis: Same  Anesthesia: Epidural  Anesthesiologist: Dr. Brayton CavesFreeman Jackson  Procedure: Primary urgent low transverse cesarean section  Surgeon: Dr. Genia DelMarie-Lyne Devora Tortorella  Assistant: Donette LarryMelanie Bhambri   Estimated blood loss: 1000 cc  Procedure:  After being informed of the planned procedure and possible complications including bleeding, infection, injury to other organs, informed consent is obtained. The patient is taken to OR #9 and raising the epidural anesthesia level without complication. She is placed in the dorsal decubitus position with the pelvis tilted to the left. She is then prepped and draped in a sterile fashion. A Foley catheter is already inserted in her bladder.  After assessing adequate level of anesthesia, we infiltrate the suprapubic area with 20 cc of Marcaine 0.25 and perform a Pfannenstiel incision which is brought down sharply to the fascia. The fascia is entered in a low transverse fashion. Linea alba is dissected. Peritoneum is entered in a midline fashion. An Alexis retractor is easily positioned. Visceral peritoneum is entered in a low transverse fashion allowing us to safely retract bladder by developing a bladder flap.  The myometrium is then entered in a low transverse fashion; first with knife and then extended bluntly. Amniotic fluid is clear. We assist the birth of a female  infant in cephalic, occiput posterior presentation. Mouth and nose are suctioned. The baby is delivered. The cord is clamped and sectioned. The baby is given to the neonatologist present in the room.  10 cc of blood is drawn from the umbilical vein.The placenta is allowed to deliver spontaneously. It is complete and the cord has 3 vessels. Uterine revision is negative.  We  proceed with closure of the myometrium in 2 layers: First with a running locked suture of 0 Vicryl, then with a Lembert suture of 0 Vicryl imbricating the first one. Hemostasis is completed figures of eight with Vicryl 0 and with cauterization on peritoneal edges.  Both paracolic gutters are cleaned. Both tubes and ovaries are assessed and normal.  We confirm a satisfactory hemostasis.  Retractors and sponges are removed. Under fascia hemostasis is completed with cauterization.  The parietal peritoneum is closed with Vicryl 2-0 in a running suture. The fascia is then closed with 2 running sutures of 0 Vicryl meeting midline.  Hemostasis is completed with cauterization. The skin is closed with a subcuticular suture of 3-0 Monocryl and Dermabond.  A Honeycomb dressing is applied.  Instrument and sponge count is complete x2. Estimated blood loss is 1000 cc.  The procedure is well tolerated by the patient who is taken to recovery room in a well and stable condition.  female baby was born at 00:18 and received an Apgar of 8  at 1 minute and 9 at 5 minutes. Weight was 8 lbs  13 oz.    Specimen: Placenta sent to L & D   Dezaray Shibuya,MARIE-LYNE MD 12/19/20151:27 AM

## 2014-06-08 NOTE — Transfer of Care (Signed)
Immediate Anesthesia Transfer of Care Note  Patient: Brianna GroutStephanie T Davis  Procedure(s) Performed: Procedure(s): CESAREAN SECTION (N/A)  Patient Location: PACU  Anesthesia Type:Epidural  Level of Consciousness: awake, alert  and oriented  Airway & Oxygen Therapy: Patient Spontanous Breathing  Post-op Assessment: Report given to PACU RN and Post -op Vital signs reviewed and stable  Post vital signs: Reviewed and stable  Complications: No apparent anesthesia complications

## 2014-06-08 NOTE — Anesthesia Postprocedure Evaluation (Signed)
Anesthesia Post Note  Patient: Oneal GroutStephanie T Hettich  Procedure(s) Performed: Procedure(s) (LRB): CESAREAN SECTION (N/A)  Anesthesia type: Epidural  Patient location: Mother/Baby  Post pain: Pain level controlled  Post assessment: Post-op Vital signs reviewed  Last Vitals:  Filed Vitals:   06/08/14 0600  BP: 114/65  Pulse: 90  Temp: 36.8 C  Resp: 18    Post vital signs: Reviewed  Level of consciousness:alert  Complications: No apparent anesthesia complications

## 2014-06-08 NOTE — Addendum Note (Signed)
Addendum  created 06/08/14 0827 by Algis GreenhouseLinda A Dyshon Philbin, CRNA   Modules edited: Notes Section   Notes Section:  File: [id number]

## 2014-06-08 NOTE — Progress Notes (Addendum)
Patient ID: Brianna Davis, female   DOB: Jun 02, 1976, 38 y.o.   MRN: [id number] Subjective: POD# 0  Information for the patient's newborn:  Brianna Davis, Brianna Davis [[id number]][[id number]]  female   Reports feeling well.  A little itchy from anesthesia. Feeding: breast Patient reports tolerating PO.  Breast symptoms: mild soreness when latching Pain controlled with ibuprofen (OTC) and narcotic analgesics including Percocet Denies HA/SOB/C/P/N/V/dizziness. Flatus absent. No BM. She reports vaginal bleeding as normal, without clots.  She is ambulating, Foley catheter in place.     Objective:   VS:  Filed Vitals:   06/08/14 0251 06/08/14 0350 06/08/14 0450 06/08/14 0600  BP: 131/79 121/68 126/68 114/65  Pulse: 72 75 79 90  Temp: 98 F (36.7 C) 98.7 F (37.1 C) 97.6 F (36.4 C) 98.3 F (36.8 C)  TempSrc: Axillary Axillary Oral Oral  Resp: 22 18 18 18   Height:      Weight:      SpO2: 97% 97% 97% 96%     Intake/Output Summary (Last 24 hours) at 06/08/14 0837 Last data filed at 06/08/14 0230  Gross per 24 hour  Intake   2525 ml  Output   2600 ml  Net    -75 ml        Recent Labs  06/08/14 0135 06/08/14 0615  WBC 16.5* 16.4*  HGB 10.0* 9.0*  HCT 29.8* 27.4*  PLT 283 304     Blood type: O POS (12/17 2103)  Rubella: Immune (05/26 0000)     Physical Exam:  General: alert, cooperative and no distress CV: Regular rate and rhythm, S1S2 present or without murmur or extra heart sounds Resp: clear Abdomen: soft, nontender, normal bowel sounds Incision: Tegaderm and Honeycomb dressing C/D/I - skin well-approximated with sutures Uterine Fundus: firm, 1 FB below umbilicus, nontender Lochia: minimal  Foley catheter draining without difficulty to gravity / clear, amber urine Ext: edema trace and Homans sign is negative, no sign of DVT - not wearing SCD hose   Assessment/Plan: 38 y.o.   POD# 0.  s/p Cesarean Delivery.  Indications: arrest of dilation                Principal  Problem:   Postpartum care following cesarean delivery (12/19) Active Problems:   Normal labor   Postoperative state  Doing well, stable.               Regular diet as tolerated D/C foley per protocol D/C IV per protocol Ambulate Routine post-op care Anticipate early discharge on POD 2, pending peds and postoperative progress  Kenard GowerAWSON, Tania Perrott, M, MSN, CNM 06/08/2014, 8:30 AM

## 2014-06-08 NOTE — Lactation Note (Signed)
This note was copied from the chart of Brianna Davis. Lactation Consultation Note  Mom reports that BF is going well but she also reports not knowing how wide the baby;s mouth should be open.  I reviewed hand expression and feeding cues.  An off center latch was describe to her to aid in latching.  I left my number and encouraged mom to call for a latch check.  Patient Name: Brianna Julieta GuttingStephanie Mancusi Today's Date: 06/08/2014     Maternal Data Has patient been taught Hand Expression?: Yes Does the patient have breastfeeding experience prior to this delivery?: No  Feeding Feeding Type: Breast Fed Length of feed: 15 min  LATCH Score/Interventions                      Lactation Tools Discussed/Used     Consult Status Consult Status: Follow-up Date: 06/09/14    Soyla DryerJoseph, Alekzander Cardell 06/08/2014, 11:29 AM

## 2014-06-08 NOTE — Anesthesia Postprocedure Evaluation (Signed)
  Anesthesia Post Note  Patient: Oneal GroutStephanie T Kief  Procedure(s) Performed: Procedure(s) (LRB): CESAREAN SECTION (N/A)  Anesthesia type: Epidural  Patient location: PACU  Post pain: Pain level controlled  Post assessment: Post-op Vital signs reviewed  Last Vitals:  Filed Vitals:   06/08/14 0350  BP: 121/68  Pulse: 75  Temp: 37.1 C  Resp: 18    Post vital signs: Reviewed  Level of consciousness: awake  Complications: No apparent anesthesia complications

## 2014-06-09 MED ORDER — MAGNESIUM OXIDE 400 (241.3 MG) MG PO TABS
200.0000 mg | ORAL_TABLET | Freq: Every day | ORAL | Status: DC
  Administered 2014-06-09 – 2014-06-10 (×2): 200 mg via ORAL
  Filled 2014-06-09 (×2): qty 0.5

## 2014-06-09 NOTE — Lactation Note (Signed)
This note was copied from the chart of Brianna Julieta GuttingStephanie Stanard. Lactation Consultation Note  Mom is reporting sore nipples and is using comfort gels.  I assisted mom with a deeper latch and she reported more comfort.  Brianna Davis was not sucking deeply.  Mom stated that she had just fed and may not be hungry.  I advised her to watch for long suckles and to call for help as needed.    Patient Name: Brianna Davis Today's Date: 06/09/2014     Maternal Data    Feeding Feeding Type: Breast Fed  LATCH Score/Interventions Latch: Grasps breast easily, tongue down, lips flanged, rhythmical sucking.  Audible Swallowing: None  Type of Nipple: Everted at rest and after stimulation  Comfort (Breast/Nipple): Filling, red/small blisters or bruises, mild/mod discomfort     Hold (Positioning): Assistance needed to correctly position infant at breast and maintain latch.  LATCH Score: 6  Lactation Tools Discussed/Used     Consult Status      Brianna Davis, Brianna Davis 06/09/2014, 5:10 PM

## 2014-06-09 NOTE — Progress Notes (Signed)
Patient ID: Brianna Davis, female   DOB: 08-10-1975, 38 y.o.   MRN: [id number] Subjective: POD# 1 Information for the patient's newborn:  Brianna Davis, Girl Brianna Davis [[id number]][[id number]]  female    Reports feeling a little sore today. Got a little too active yesterday   Feeding: breast Patient reports tolerating PO.  Breast symptoms: mild soreness when latching Pain controlled with ibuprofen (OTC) and narcotic analgesics including Percocet Denies HA/SOB/C/P/N/V/dizziness. Flatus (+). No BM. She reports vaginal bleeding as normal, without clots. Had increased bleeding after increased activity yesterday.  She is ambulating, urinating without difficult.     Objective:   VS:  Filed Vitals:   06/08/14 1329 06/08/14 1700 06/08/14 2200 06/09/14 0530  BP: 104/57 113/73 113/79 114/53  Pulse: 79 78 82 66  Temp: 98.1 F (36.7 C) 98.5 F (36.9 C) 98.4 F (36.9 C) 98.4 F (36.9 C)  TempSrc: Oral Oral Oral Oral  Resp: 18 18 18 18   Height:      Weight:      SpO2: 97%  99%     No intake or output data in the 24 hours ending 06/09/14 1001       Recent Labs  06/08/14 0135 06/08/14 0615  WBC 16.5* 16.4*  HGB 10.0* 9.0*  HCT 29.8* 27.4*  PLT 283 304     Blood type: O POS (12/17 2103)  Rubella: Immune (05/26 0000)     Physical Exam:  General: alert, cooperative and no distress CV: Regular rate and rhythm, S1S2 present or without murmur or extra heart sounds Resp: clear Abdomen: soft, nontender, normal bowel sounds Incision: Tegaderm and Honeycomb dressing C/D/I - skin well-approximated with sutures Uterine Fundus: firm, @ umbilicus, nontender Lochia: minimal Ext: edema trace and Homans sign is negative, no sign of DVT - not wearing SCD hose   Assessment/Plan: 38 y.o.   POD# 1.  s/p Cesarean Delivery.  Indications: arrest of dilation                Principal Problem:   Postpartum care following cesarean delivery (12/19) Active Problems:   Normal labor   Postoperative state  Doing  well, stable.               Regular diet as tolerated Ambulate Routine post-op care Desires early discharge on POD 2, pending peds and postoperative progress  Kenard GowerAWSON, Wylodean Shimmel, M, MSN, CNM 06/09/2014, 8:30 AM

## 2014-06-10 MED ORDER — FERROUS SULFATE 325 (65 FE) MG PO TABS: 325.0000 mg | ORAL_TABLET | Freq: Two times a day (BID) | ORAL | Status: DC

## 2014-06-10 MED ORDER — MAGNESIUM OXIDE 400 (241.3 MG) MG PO TABS: ORAL_TABLET | ORAL | Status: DC

## 2014-06-10 MED ORDER — IBUPROFEN 600 MG PO TABS: 600.0000 mg | ORAL_TABLET | Freq: Four times a day (QID) | ORAL | Status: DC | PRN

## 2014-06-10 MED ORDER — OXYCODONE-ACETAMINOPHEN 5-325 MG PO TABS: 1.0000 | ORAL_TABLET | ORAL | Status: DC | PRN

## 2014-06-10 NOTE — Lactation Note (Signed)
This note was copied from the chart of Brianna Davis. Lactation Consultation Note  Baby recently breastfed for 20 min.  Sleeping on mother's chest. FOB concerned about mother's milk supply.  Encouraged mother to feed longer. Encouraged mother to breastfeed on both breasts when soreness improves.  BF then burp then switch. Reviewed cluster feeding, engorgement care and hand pump use. Provided mother with another set of comfort gels.   Patient Name: Brianna Julieta GuttingStephanie Cauble ZOXWR'UToday's Date: 06/10/2014 Reason for consult: Follow-up assessment   Maternal Data    Feeding Feeding Type: Breast Fed Length of feed: 20 min  LATCH Score/Interventions                      Lactation Tools Discussed/Used     Consult Status Consult Status: Complete    Hardie PulleyBerkelhammer, Tamarra Geiselman Boschen 06/10/2014, 10:02 AM

## 2014-06-10 NOTE — Progress Notes (Signed)
Patient concerned of incision being "puffy" and "hard feeling".  Honeycomb dressing in place, bruising noted around incision.  Incision not red or warm to the touch.  Encouraged patient that the incision appears normal, just bruised and assured her I would notify the CNM.  Brianna Davis at the desk and discussed with her at this time.   Cox, [name]

## 2014-06-10 NOTE — Discharge Summary (Signed)
POSTOPERATIVE DISCHARGE SUMMARY:  Patient ID: Brianna Davis MRN: [id number] DOB/AGE: 03-20-1976 38 y.o.  Admit date: 06/06/2014 Admission Diagnoses: 41 weeks /  PIH  Discharge date:  06/10/2014 Discharge Diagnoses: POD #2 s/p cesarean section for arrest of active labor / PIH - stable  Prenatal history: G2P1011   EDC : 06/01/2014, by Other Basis  [facility]  Primary provider : Seymour BarsLavoie Prenatal course complicated by gestational hypertension (labetalol 100mg  BID)  Prenatal Labs: ABO, Rh: --/--/O POS (12/17 2103)  Antibody: NEG (12/17 2103) Rubella: Immune (05/26 0000)   RPR: NON REAC (12/17 2103)  HBsAg: Negative (05/26 0000)  HIV: Non-reactive (05/26 0000)  GTT : NL GBS: Positive (11/25 0000)   Medical / Surgical History :  Past medical history:  Past Medical History  Diagnosis Date  . Anxiety   . IBS (irritable bowel syndrome)   . S/P endoscopy April 2012    Dr. Jena Gaussourk: friability at GE junction, negative Barrett's orH.pylori, diffuse gastric petechia  . Nephrolithiasis   . Migraine   . Degenerative disc disease   . Degenerative disc disease, lumbar     Past surgical history:  Past Surgical History  Procedure Laterality Date  . Ileocolonoscopy  12/2007    Friable anal canal.  . Esophagogastroduodenoscopy  09/30/2010    WUJ:WJXBJRMR:Small hiatal hernia/Friability at the gastroesophageal junction with undulating Z-line versus short segment Barrett esophagus s/p bx  . Laparoscopy N/A 04/03/2013    Procedure: LAPAROSCOPY DIAGNOSTIC;  Surgeon: Genia DelMarie-Lyne Lavoie, MD;  Location: [facility];  Service: Gynecology;  Laterality: N/A;  . Chromopertubation Bilateral 04/03/2013    Procedure: CHROMOPERTUBATION with Methylene Blue, with patent right tube and proximal obstruction of left tube;  Surgeon: Genia DelMarie-Lyne Lavoie, MD;  Location: [facility];  Service: Gynecology;  Laterality: Bilateral;  . Robotic assisted laparoscopic lysis of adhesion N/A 04/03/2013     Procedure: ROBOTIC ASSISTED LAPAROSCOPIC LYSIS OF ADHESION; LYSIS OF ENDOMETRIOSIS ;  Surgeon: Genia DelMarie-Lyne Lavoie, MD;  Location: [facility];  Service: Gynecology;  Laterality: N/A;    Family History:  Family History  Problem Relation Age of Onset  . Irritable bowel syndrome Mother   . Colon cancer Neg Hx   . Inflammatory bowel disease Neg Hx   . Liver disease Neg Hx   . GI problems Sister     gastroparesis    Social History:  reports that she has quit smoking. Her smoking use included Cigarettes. She has a 14 pack-year smoking history. She has never used smokeless tobacco. She reports that she does not drink alcohol or use illicit drugs.  Allergies: Dilaudid   Current Medications at time of admission:  Prior to Admission medications   Medication Sig Start Date End Date Taking? Authorizing Provider  acetaminophen (TYLENOL) 500 MG tablet Take 500 mg by mouth every 6 (six) hours as needed for headache.   Yes Historical Provider, MD  calcium carbonate (TUMS - DOSED IN MG ELEMENTAL CALCIUM) 500 MG chewable tablet Chew 1 tablet by mouth 4 (four) times daily as needed for indigestion or heartburn.   Yes Historical Provider, MD  Emollient (EUCERIN) lotion Apply 1 Bottle topically as needed (For eczema.).   Yes Historical Provider, MD  labetalol (NORMODYNE) 100 MG tablet Take 1 tablet (100 mg total) by mouth 2 (two) times daily. 05/15/14  Yes Lawernce PittsMelanie N Bhambri, CNM  Prenatal Vit-Fe Fumarate-FA (PRENATAL MULTIVITAMIN) TABS tablet Take 1 tablet by mouth at bedtime.    Yes Historical Provider, MD  ranitidine (ZANTAC) 150  MG tablet Take 150 mg by mouth 2 (two) times daily as needed for heartburn.    Yes Historical Provider, MD   Intrapartum Course:  Admit for induction of labor with labor progression to 6.5 dilation with protracted active labor curve  Pain management: epidural  Complicated by: arrest of active labor x 11 hours (5cm to 6.5 - no cervical change in final 4 hr of labor)                             Maternal fever - presumptive chorioamnionitis (treated with Unasyn intrapartum)                            Category 2 FHR tracing with repetitive variable decelerations  Interventions required: urgent cesarean section  Procedures: Cesarean section delivery on 06/08/2014 with delivery of female newborn by Dr Seymour BarsLavoie   See operative report for further details APGAR (1 MIN): 8   APGAR (5 MINS): 9    Postoperative / postpartum course:  Uncomplicated with discharge on POD 2 Gestational hypertension - stable BP postop without any antihypertensive medication  Discharge Instructions:  Discharged Condition: stable  Activity: pelvic rest and postoperative restrictions x 2   Diet: routine  Medications:    Medication List    STOP taking these medications        calcium carbonate 500 MG chewable tablet  Commonly known as:  TUMS - dosed in mg elemental calcium     labetalol 100 MG tablet  Commonly known as:  NORMODYNE      TAKE these medications        acetaminophen 500 MG tablet  Commonly known as:  TYLENOL  Take 500 mg by mouth every 6 (six) hours as needed for headache.     eucerin lotion  Apply 1 Bottle topically as needed (For eczema.).     ferrous sulfate 325 (65 FE) MG tablet  Take 1 tablet (325 mg total) by mouth 2 (two) times daily with a meal.     ibuprofen 600 MG tablet  Commonly known as:  ADVIL,MOTRIN  Take 1 tablet (600 mg total) by mouth every 6 (six) hours as needed for mild pain.     magnesium oxide 400 (241.3 MG) MG tablet  Commonly known as:  MAG-OX  200mg  BID with iron to prevent constipation     oxyCODONE-acetaminophen 5-325 MG per tablet  Commonly known as:  PERCOCET/ROXICET  Take 1 tablet by mouth every 4 (four) hours as needed (for pain scale less than 7).     prenatal multivitamin Tabs tablet  Take 1 tablet by mouth at bedtime.     ranitidine 150 MG tablet  Commonly known as:  ZANTAC  Take 150 mg by mouth 2 (two) times daily as  needed for heartburn.        Wound Care: keep clean and dry / remove honeycomb POD 4-7 per patient preference Postpartum Instructions: [facility] discharge booklet - instructions reviewed  Discharge to: Home  Follow up :  [facility] in 7 days  for interval visit with nurse for blood pressure check and incision check [facility] in 6 weeks for routine postpartum visit with Dr Seymour BarsLavoie                Signed: Marlinda MikeBAILEY, Dontavis Tschantz CNM, MSN, [facility]ACNM 06/10/2014, 10:46 AM

## 2014-06-10 NOTE — Progress Notes (Signed)
POSTOPERATIVE DAY # 2 S/P Primary C/S for arrest of dilation    S:         Reports feeling good, ready to be discharged home             Tolerating po intake / no nausea / no vomiting / + flatus / + BM this AM             Bleeding is light             Pain controlled withMotrin and Percocet             Up ad lib / ambulatory/ voiding QS  Newborn breast feeding - going well   O:  VS: BP 115/54 mmHg  Pulse 79  Temp(Src) 98.1 F (36.7 C) (Oral)  Resp 18  Ht 5\' 5"  (1.651 m)  Wt 92.534 kg (204 lb)  BMI 33.95 kg/m2  SpO2 99%  LMP 06/29/2013  Breastfeeding? Unknown  Trending BPs: 114/53, 118/73, 130/80, 115/54   LABS:               Recent Labs  06/08/14 0135 06/08/14 0615  WBC 16.5* 16.4*  HGB 10.0* 9.0*  PLT 283 304               Bloodtype: --/--/O POS (12/17 2103)  Rubella: Immune (05/26 0000)                                Physical Exam:             Alert and Oriented X3  Lungs: Clear and unlabored  Heart: regular rate and rhythm / no mumurs  Abdomen: soft, non-tender, non-distended, active bowel sounds present in all 4 quadrants              Fundus: firm, non-tender, U-2             Dressing: honeycomb dressing clean, dry, intact              Incision:  approximated with suture / no erythema / ecchymosis extending into mons pubis / no drainage/no evidence of seroma   Perineum: intact, slightly edematous   Lochia: small, no clots noted   Extremities: non-pitting dependent edema, no calf pain or tenderness, negative Homans  A:         POD # 2 S/P Primary C/S for Arrest of Dilation              ABL - Anemia - stable on Ferrous Sulfate   Gestational Hypertension delivered - BP's stable off meds  P:         Discharge home today  WOB discharge book given and reviewed   Comfort measures for flatus  Begin Magnesium Oxide with Ferrous Sulfate and continue Prenatal vitamin  F/U in office on Monday (12/28) for incision check   Recheck Hgb at 6 week PP visit  Milinda CaveMeredith  Isiaih Hollenbach, SNM

## 2014-06-11 ENCOUNTER — Encounter (HOSPITAL_COMMUNITY): Payer: Self-pay | Admitting: Obstetrics & Gynecology

## 2014-07-11 ENCOUNTER — Ambulatory Visit (HOSPITAL_COMMUNITY): Admission: RE | Admit: 2014-07-11 | Payer: Self-pay | Source: Ambulatory Visit

## 2014-07-18 ENCOUNTER — Ambulatory Visit (HOSPITAL_COMMUNITY): Admission: RE | Admit: 2014-07-18 | Payer: Self-pay | Source: Ambulatory Visit

## 2014-10-28 ENCOUNTER — Encounter: Payer: Self-pay | Admitting: Family Medicine

## 2014-10-28 ENCOUNTER — Ambulatory Visit (INDEPENDENT_AMBULATORY_CARE_PROVIDER_SITE_OTHER): Payer: 59 | Admitting: Family Medicine

## 2014-10-28 VITALS — BP 130/88 | Temp 98.2°F | Ht 65.0 in | Wt 184.2 lb

## 2014-10-28 DIAGNOSIS — B9689 Other specified bacterial agents as the cause of diseases classified elsewhere: Secondary | ICD-10-CM

## 2014-10-28 DIAGNOSIS — B349 Viral infection, unspecified: Secondary | ICD-10-CM

## 2014-10-28 DIAGNOSIS — J019 Acute sinusitis, unspecified: Secondary | ICD-10-CM

## 2014-10-28 MED ORDER — AMOXICILLIN 500 MG PO TABS: 500.0000 mg | ORAL_TABLET | Freq: Three times a day (TID) | ORAL | Status: AC

## 2014-10-28 NOTE — Progress Notes (Signed)
   Subjective:    Patient ID: Brianna Davis, female    DOB: 12-May-1976, 39 y.o.   MRN: [id number]  Cough This is a new problem. The current episode started yesterday. The problem has been gradually worsening. The problem occurs constantly. Associated symptoms include headaches and rhinorrhea. Pertinent negatives include no chest pain, ear pain, fever, shortness of breath or wheezing.   patient states that she start off head congestion drainage sinus pressure not feeling good past few days son had similar illness that he had fever she denies fever denies muscle aches. Does not appear toxic today  Review of Systems  Constitutional: Negative for fever and activity change.  HENT: Positive for congestion and rhinorrhea. Negative for ear pain.   Eyes: Negative for discharge.  Respiratory: Positive for cough. Negative for shortness of breath and wheezing.   Cardiovascular: Negative for chest pain.  Neurological: Positive for headaches.       Objective:   Physical Exam  Constitutional: She appears well-developed.  HENT:  Head: Normocephalic.  Nose: Nose normal.  Mouth/Throat: Oropharynx is clear and moist. No oropharyngeal exudate.  Neck: Neck supple.  Cardiovascular: Normal rate and normal heart sounds.   No murmur heard. Pulmonary/Chest: Effort normal and breath sounds normal. She has no wheezes.  Lymphadenopathy:    She has no cervical adenopathy.  Skin: Skin is warm and dry.  Nursing note and vitals reviewed.         Assessment & Plan:  Viral illness Secondary sinusitis Antibiotics Warning signs were discussed. May continue to breast-feed. I would recommend a follow-up office visit to recheck blood pressure in the next several weeks

## 2014-10-29 ENCOUNTER — Telehealth: Payer: Self-pay

## 2014-10-29 NOTE — Telephone Encounter (Signed)
Patient scheduled nurse visit for BP recheck.

## 2014-10-29 NOTE — Telephone Encounter (Signed)
-----   Message from Salvadore OxfordBarbara E Graves sent at 10/29/2014  9:15 AM EDT -----   ----- Message -----    From: Babs SciaraScott A Luking, MD    Sent: 10/28/2014   5:53 PM      To: [facility]  Please have the patient follow-up for nurse visit within the next few weeks to recheck her blood pressure tell the patient that her blood pressure was borderline given her recent pregnancy she needs to follow this up to make sure back to normal

## 2014-11-12 ENCOUNTER — Ambulatory Visit: Payer: 59

## 2014-11-12 VITALS — BP 128/72

## 2014-11-12 DIAGNOSIS — Z0130 Encounter for examination of blood pressure without abnormal findings: Secondary | ICD-10-CM

## 2014-11-12 NOTE — Progress Notes (Signed)
   Subjective:    Patient ID: Oneal GroutStephanie T Rheaume, female    DOB: 1975/11/08, 39 y.o.   MRN: [id number]  HPI Dr. Lorin PicketScott checked blood pressure and got a reading of 128/72. He advised patient to follow up in 3 months, sooner if problems occur. Discussed DASH diet with patient also.    Review of Systems     Objective:   Physical Exam        Assessment & Plan:

## 2015-03-31 ENCOUNTER — Ambulatory Visit (INDEPENDENT_AMBULATORY_CARE_PROVIDER_SITE_OTHER): Payer: 59 | Admitting: Family Medicine

## 2015-03-31 ENCOUNTER — Encounter: Payer: Self-pay | Admitting: Family Medicine

## 2015-03-31 VITALS — BP 138/88 | Ht 65.0 in | Wt 183.0 lb

## 2015-03-31 DIAGNOSIS — M778 Other enthesopathies, not elsewhere classified: Secondary | ICD-10-CM | POA: Diagnosis not present

## 2015-03-31 NOTE — Progress Notes (Addendum)
   Subjective:    Patient ID: Brianna Davis, female    DOB: 04/21/1976, 39 y.o.   MRN: [id number]  HPIleft wrist pain. Started 2 weeks ago. Using tylenol and wrist brace.  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 Relates some subjective numbness in the thumb and small finger intermittently. Denies elbow or shoulder pain. No known injury.   Review of Systems No fever no redness no elbow or shoulder pain see above    Objective:   Physical Exam on physical exam elbow normal forearm normal hand appears normal tenderness in the left wrist hurts with flexion hurts with outward and inward deviation. Subjective numbness in the thumb and small finger. No weakness detected except what would be expected from soreness no cyst seen      Assessment & Plan:  Left wrist tendinitis immobilizer ibuprofen 200 mg 2 in the morning 2 in the afternoon 2 in the evening for 7 days cool compresses 20 minutes at a time every 2 hours patient not to do keyboarding for the next 7 days. Follow-up if ongoing trouble may need orthopedic referral

## 2015-03-31 NOTE — Patient Instructions (Signed)
Immobilizer for the wrist   Ibuprofen 200 mg , 2 po TID for 7 days  Cool compresses 20 minutes every 2 hours

## 2015-05-12 ENCOUNTER — Ambulatory Visit (INDEPENDENT_AMBULATORY_CARE_PROVIDER_SITE_OTHER): Payer: 59 | Admitting: Family Medicine

## 2015-05-12 ENCOUNTER — Encounter: Payer: Self-pay | Admitting: Family Medicine

## 2015-05-12 VITALS — BP 122/86 | Temp 98.4°F | Ht 65.0 in | Wt 185.1 lb

## 2015-05-12 DIAGNOSIS — J069 Acute upper respiratory infection, unspecified: Secondary | ICD-10-CM

## 2015-05-12 DIAGNOSIS — J019 Acute sinusitis, unspecified: Secondary | ICD-10-CM

## 2015-05-12 DIAGNOSIS — B9689 Other specified bacterial agents as the cause of diseases classified elsewhere: Secondary | ICD-10-CM

## 2015-05-12 DIAGNOSIS — B9789 Other viral agents as the cause of diseases classified elsewhere: Secondary | ICD-10-CM

## 2015-05-12 MED ORDER — AMOXICILLIN 500 MG PO TABS: 500.0000 mg | ORAL_TABLET | Freq: Three times a day (TID) | ORAL | Status: DC

## 2015-05-12 NOTE — Progress Notes (Signed)
   Subjective:    Patient ID: Oneal GroutStephanie T Pekar, female    DOB: 07/10/1975, 39 y.o.   MRN: [id number]  Cough This is a new problem. The current episode started in the past 7 days. The problem has been gradually worsening. The cough is productive of sputum. Associated symptoms include rhinorrhea and a sore throat. Pertinent negatives include no chest pain, ear pain, fever, shortness of breath or wheezing. Treatments tried: Tylenol, The treatment provided no relief.   no high fever no vomiting no diarrhea  Patient states no other concerns this visit.  Review of Systems  Constitutional: Negative for fever and activity change.  HENT: Positive for congestion, rhinorrhea and sore throat. Negative for ear pain.   Eyes: Negative for discharge.  Respiratory: Positive for cough. Negative for shortness of breath and wheezing.   Cardiovascular: Negative for chest pain.       Objective:   Physical Exam  Constitutional: She appears well-developed.  HENT:  Head: Normocephalic.  Nose: Nose normal.  Mouth/Throat: Oropharynx is clear and moist. No oropharyngeal exudate.  Neck: Neck supple.  Cardiovascular: Normal rate and normal heart sounds.   No murmur heard. Pulmonary/Chest: Effort normal and breath sounds normal. She has no wheezes.  Lymphadenopathy:    She has no cervical adenopathy.  Skin: Skin is warm and dry.  Nursing note and vitals reviewed.    moderate sinus tenderness maxillary lateral   patient breast-feeding    Assessment & Plan:   Sinusitis antibiotics prescribed warning signs discussed follow-up if problems -may use Robitussin-DM as well

## 2015-07-14 ENCOUNTER — Other Ambulatory Visit (HOSPITAL_COMMUNITY)
Admission: AD | Admit: 2015-07-14 | Discharge: 2015-07-14 | Disposition: A | Discharge status: Home / Self Care | Payer: Commercial Managed Care - HMO | Source: Ambulatory Visit | Attending: Family Medicine | Admitting: Family Medicine

## 2015-07-14 ENCOUNTER — Encounter: Payer: Self-pay | Admitting: Family Medicine

## 2015-07-14 ENCOUNTER — Ambulatory Visit (INDEPENDENT_AMBULATORY_CARE_PROVIDER_SITE_OTHER): Payer: 59 | Admitting: Family Medicine

## 2015-07-14 ENCOUNTER — Ambulatory Visit (HOSPITAL_COMMUNITY)
Admission: RE | Admit: 2015-07-14 | Discharge: 2015-07-14 | Disposition: A | Discharge status: Home / Self Care | Payer: Commercial Managed Care - HMO | Source: Ambulatory Visit | Attending: Family Medicine | Admitting: Family Medicine

## 2015-07-14 VITALS — BP 130/82 | Ht 65.0 in | Wt 187.2 lb

## 2015-07-14 DIAGNOSIS — D649 Anemia, unspecified: Secondary | ICD-10-CM

## 2015-07-14 DIAGNOSIS — R079 Chest pain, unspecified: Secondary | ICD-10-CM

## 2015-07-14 DIAGNOSIS — Z87891 Personal history of nicotine dependence: Secondary | ICD-10-CM | POA: Insufficient documentation

## 2015-07-14 LAB — CBC WITH DIFFERENTIAL/PLATELET
BASOS PCT: 1 %
Basophils Absolute: 0 10*3/uL (ref 0.0–0.1)
EOS ABS: 0.3 10*3/uL (ref 0.0–0.7)
Eosinophils Relative: 4 %
HCT: 39.2 % (ref 36.0–46.0)
HEMOGLOBIN: 13.3 g/dL (ref 12.0–15.0)
Lymphocytes Relative: 45 %
Lymphs Abs: 3.2 10*3/uL (ref 0.7–4.0)
MCH: 30.2 pg (ref 26.0–34.0)
MCHC: 33.9 g/dL (ref 30.0–36.0)
MCV: 89.1 fL (ref 78.0–100.0)
Monocytes Absolute: 0.4 10*3/uL (ref 0.1–1.0)
Monocytes Relative: 6 %
NEUTROS PCT: 44 %
Neutro Abs: 3.2 10*3/uL (ref 1.7–7.7)
Platelets: 329 10*3/uL (ref 150–400)
RBC: 4.4 MIL/uL (ref 3.87–5.11)
RDW: 13.1 % (ref 11.5–15.5)
WBC: 7.1 10*3/uL (ref 4.0–10.5)

## 2015-07-14 LAB — D-DIMER, QUANTITATIVE (NOT AT ARMC)

## 2015-07-14 LAB — TROPONIN I

## 2015-07-14 NOTE — Progress Notes (Signed)
   Subjective:    Patient ID: Brianna Davis, female    DOB: Dec 09, 1975, 40 y.o.   MRN: [id number]  HPI Patient arrives with c/o chest tightness and right arm and back pain starting last pm patient states she started with pain in the back of her right arm lasted for a few minutes then she started experiencing tightness in her chest but no shortness of breath no wheezing no sharp pains no nausea vomiting no fever chills or cough she states she stays active at home with her child and does not experience any chest tightness pressure pain shortness of breath this is a new occurrence for her She does not have any family history of premature heart disease area she does not have any risk factors for pulmonary embolism. She is not currently active but she does stay busy with in-house  Review of Systems  Constitutional: Positive for chills. Negative for fever and fatigue.  HENT: Negative for congestion.   Respiratory: Negative for cough.   Cardiovascular: Positive for chest pain. Negative for palpitations and leg swelling.  Gastrointestinal: Negative for nausea, abdominal pain, diarrhea and abdominal distention.  Musculoskeletal: Positive for myalgias. Negative for neck pain.  25 minutes was spent with the patient. Greater than half the time was spent in discussion and answering questions and counseling regarding the issues that the patient came in for today. EKG reviewed appears normal Stat labs were ordered and followed up patient was also call.     Objective:   Physical Exam  Constitutional: She appears well-nourished. No distress.  Cardiovascular: Normal rate, regular rhythm and normal heart sounds.   No murmur heard. Pulmonary/Chest: Effort normal and breath sounds normal. No respiratory distress.  Musculoskeletal: She exhibits no edema.  Lymphadenopathy:    She has no cervical adenopathy.  Neurological: She is alert. She exhibits normal muscle tone.  Psychiatric: Her behavior is normal.    Vitals reviewed.   slight tenderness in the chest wall no crackles noted no murmur no tachycardia       Assessment & Plan:   chest pain musculoskeletal. Stat lab work and stat chest x-ray was ordered. Lab work negative for heart attack negative for pulmonary embolism. Chest x-ray negative for pneumothorax or pneumonia or tumor patient will use ibuprofen 200 mg 2 tablets 3 times daily for the next 5-7 days she will notify us if her symptoms worsen   patient was told to let us know if she is not improving over the course of the next few days

## 2015-07-22 ENCOUNTER — Encounter: Payer: Self-pay | Admitting: Family Medicine

## 2015-11-10 ENCOUNTER — Ambulatory Visit: Payer: 59 | Admitting: Family Medicine

## 2015-11-27 ENCOUNTER — Ambulatory Visit (HOSPITAL_COMMUNITY)
Admission: RE | Admit: 2015-11-27 | Discharge: 2015-11-27 | Disposition: A | Discharge status: Home / Self Care | Payer: 59 | Source: Ambulatory Visit | Attending: Nurse Practitioner | Admitting: Nurse Practitioner

## 2015-11-27 ENCOUNTER — Telehealth: Payer: Self-pay

## 2015-11-27 ENCOUNTER — Ambulatory Visit (INDEPENDENT_AMBULATORY_CARE_PROVIDER_SITE_OTHER): Payer: 59 | Admitting: Nurse Practitioner

## 2015-11-27 ENCOUNTER — Other Ambulatory Visit: Payer: Self-pay | Admitting: Nurse Practitioner

## 2015-11-27 VITALS — BP 140/88 | Temp 98.0°F | Ht 65.0 in | Wt 191.2 lb

## 2015-11-27 DIAGNOSIS — Z87442 Personal history of urinary calculi: Secondary | ICD-10-CM

## 2015-11-27 DIAGNOSIS — R339 Retention of urine, unspecified: Secondary | ICD-10-CM | POA: Diagnosis not present

## 2015-11-27 DIAGNOSIS — R102 Pelvic and perineal pain: Secondary | ICD-10-CM | POA: Diagnosis not present

## 2015-11-27 DIAGNOSIS — N1330 Unspecified hydronephrosis: Secondary | ICD-10-CM | POA: Insufficient documentation

## 2015-11-27 LAB — POCT URINALYSIS DIPSTICK
Spec Grav, UA: 1.015
pH, UA: 7

## 2015-11-27 LAB — POCT UA - MICROSCOPIC ONLY
Bacteria, U Microscopic: NEGATIVE
RBC, URINE, MICROSCOPIC: NEGATIVE
WBC, UR, HPF, POC: NEGATIVE

## 2015-11-27 LAB — POCT URINE PREGNANCY: Preg Test, Ur: NEGATIVE

## 2015-11-27 MED ORDER — HYOSCYAMINE SULFATE 0.125 MG SL SUBL: 0.1250 mg | SUBLINGUAL_TABLET | SUBLINGUAL | Status: DC | PRN

## 2015-11-27 MED ORDER — CEFPROZIL 500 MG PO TABS: 500.0000 mg | ORAL_TABLET | Freq: Two times a day (BID) | ORAL | Status: DC

## 2015-11-27 MED ORDER — HYDROCODONE-ACETAMINOPHEN 5-325 MG PO TABS: 1.0000 | ORAL_TABLET | ORAL | Status: DC | PRN

## 2015-11-27 NOTE — Telephone Encounter (Signed)
Notified patient of ultrasound results. Patient has appointment in the morning with Dr. Lorin PicketScott and will follow up then. Patient wants to know if something can be prescribed for pain. Patient is breast feeding.

## 2015-11-27 NOTE — Telephone Encounter (Signed)
Patient notified

## 2015-11-27 NOTE — Telephone Encounter (Signed)
Printed a Rx for pain med. Will have to leave up front for someone to pick up for her.

## 2015-11-28 ENCOUNTER — Encounter: Payer: Self-pay | Admitting: Nurse Practitioner

## 2015-11-28 ENCOUNTER — Encounter: Payer: Self-pay | Admitting: Family Medicine

## 2015-11-28 ENCOUNTER — Ambulatory Visit (INDEPENDENT_AMBULATORY_CARE_PROVIDER_SITE_OTHER): Payer: 59 | Admitting: Family Medicine

## 2015-11-28 VITALS — BP 114/78 | Ht 65.0 in | Wt 191.1 lb

## 2015-11-28 DIAGNOSIS — R5383 Other fatigue: Secondary | ICD-10-CM | POA: Diagnosis not present

## 2015-11-28 DIAGNOSIS — D649 Anemia, unspecified: Secondary | ICD-10-CM | POA: Diagnosis not present

## 2015-11-28 DIAGNOSIS — N23 Unspecified renal colic: Secondary | ICD-10-CM

## 2015-11-28 DIAGNOSIS — Z139 Encounter for screening, unspecified: Secondary | ICD-10-CM | POA: Diagnosis not present

## 2015-11-28 MED ORDER — TAMSULOSIN HCL 0.4 MG PO CAPS: 0.4000 mg | ORAL_CAPSULE | Freq: Every day | ORAL | Status: DC

## 2015-11-28 MED ORDER — OXYCODONE-ACETAMINOPHEN 5-325 MG PO TABS: 1.0000 | ORAL_TABLET | ORAL | Status: DC | PRN

## 2015-11-28 MED ORDER — CEPHALEXIN 500 MG PO CAPS: 500.0000 mg | ORAL_CAPSULE | Freq: Four times a day (QID) | ORAL | Status: DC

## 2015-11-28 NOTE — Progress Notes (Addendum)
   Subjective:    Patient ID: Brianna GroutStephanie T Davis, female    DOB: 04-16-1976, 40 y.o.   MRN: [id number]  HPI The patient comes in today for a wellness visit.    A review of their health history was completed.  A review of medications was also completed.  Any needed refills; None  Eating habits: Patient states eating habits are poor. States does not eat healthy.States very picky eater  Falls/  MVA accidents in past few months: None  Regular exercise: States no gym exercises but walks daily.  Specialist pt sees on regular basis: None  Preventative health issues were discussed.   Additional concerns: Patient would like to discuss antibiotic prescribed on yesterday for kidney. Also would like to discuss weight loss medication. Patient comes in for physical but upon further discussion she gets her wellness exam through her gynecologist's of therefore we focused on her health issues her main issue is what she feels is a kidney stone she relates some pain on the right side and lower pelvic pressure she had a ultrasound done yesterday which showed some hydronephrosis on the right side the left side was normal no urinary retention seen on the ultrasound she could not afford the Cefzil she would like a different antibiotic she also does relate she does do some breast-feeding but not a lot  Review of Systems    see above denies fever chills sweats denies diarrhea vomiting. Relates moderate amount of pain hydrocodone does not help Objective:   Physical Exam  Lungs clear hearts regular right side some lower tenderness along the right side extremities no edema skin warm dry      Assessment & Plan:  Kidney stone-use Percocet for pain caution drowsiness no breast-feeding with this medicine also use Flomax daily If not significantly back to normal by Monday she needs to do CT scan and urology consultation patient was told her she gets worse over the weekend to immediately go to hospital for  further evaluation. She will try Keflex for infection. Patient understands to go to the ER if getting worse she understands to call us so weak and get urology removal involved if not doing better by Monday patient also understands not to breast-feed while on medication do not drive with taking pain medicine  Patient request screening labs  The results of her recent ultrasound was reviewed she understands she does have some mild hydronephrosis and that if her pain gets worse she must go to the ER this is evidence of a more than likely a stone patient would like to avoid radiation if possible

## 2015-11-28 NOTE — Progress Notes (Signed)
Subjective:  Presents for complaints of intense mid pelvic area pressure, urinary retention and decreased urination that began this morning. Brief right low back pain. No fever. No nausea vomiting. Urinary frequency and urgency with dribbling. Is not on birth control. Has a history of endometriosis. Just completed a normal menstrual cycle 4 days ago. Is still breast-feeding her infant. Has a history of kidney stones. Has had one episode of hydronephrosis during a pregnancy which resolved on its own once the stone past. No obvious blood in her urine. Married, same sexual partner. No vaginal discharge.  Objective:   BP 140/88 mmHg  Temp(Src) 98 F (36.7 C) (Oral)  Ht 5\' 5"  (1.651 m)  Wt 191 lb 4 oz (86.75 kg)  BMI 31.83 kg/m2 NAD. Alert, oriented. Lungs clear. Heart regular rate rhythm. Moderate CVA area tenderness more on the right. Abdomen soft nondistended with distinct mid pelvic area/suprapubic tenderness slightly more towards the right side. Results for orders placed or performed in visit on 11/27/15  POCT urinalysis dipstick  Result Value Ref Range   Color, UA     Clarity, UA     Glucose, UA     Bilirubin, UA     Ketones, UA     Spec Grav, UA 1.015    Blood, UA     pH, UA 7.0    Protein, UA     Urobilinogen, UA     Nitrite, UA     Leukocytes, UA  Negative  POCT urine pregnancy  Result Value Ref Range   Preg Test, Ur Negative Negative  POCT UA - Microscopic Only  Result Value Ref Range   WBC, Ur, HPF, POC neg    RBC, urine, microscopic neg    Bacteria, U Microscopic neg    Mucus, UA     Epithelial cells, urine per micros rare    Crystals, Ur, HPF, POC     Casts, Ur, LPF, POC     Yeast, UA    Note urine is extremely dilute.   Assessment: Urinary retention - Plan: POCT urinalysis dipstick, POCT urine pregnancy, Urine Culture, US Renal, POCT UA - Microscopic Only  Pelvic pain in female - Plan: POCT urine pregnancy, Urine Culture, US Renal, POCT UA - Microscopic  Only  History of kidney stones - Plan: US Renal, POCT UA - Microscopic Only  Plan:  Meds ordered this encounter  Medications  . DISCONTD: cefPROZIL (CEFZIL) 500 MG tablet    Sig: Take 1 tablet (500 mg total) by mouth 2 (two) times daily.    Dispense:  20 tablet    Refill:  0    Order Specific Question:  Supervising Provider    Answer:  Merlyn AlbertLUKING, WILLIAM S [[id number]]  . hyoscyamine (LEVSIN/SL) 0.125 MG SL tablet    Sig: Place 1 tablet (0.125 mg total) under the tongue every 4 (four) hours as needed.    Dispense:  30 tablet    Refill:  0    Order Specific Question:  Supervising Provider    Answer:  Merlyn AlbertLUKING, WILLIAM S [[id number]]   Renal ultrasound scheduled for today. Start Cefzil as directed as a precaution to cover for pyelonephritis. Reviewed options regarding anti-spasmodics, Levsin is the safest choice for breast-feeding. Continue clear fluid intake. Warning signs reviewed. Further follow-up based on ultrasound report. Urine culture pending. Patient has an appointment scheduled for physical tomorrow, may need to be changed to an urgent care visit.

## 2015-11-29 LAB — URINE CULTURE: Organism ID, Bacteria: NO GROWTH

## 2015-11-29 LAB — PLEASE NOTE

## 2016-02-25 ENCOUNTER — Ambulatory Visit (INDEPENDENT_AMBULATORY_CARE_PROVIDER_SITE_OTHER): Payer: 59 | Admitting: Family Medicine

## 2016-02-25 ENCOUNTER — Encounter: Payer: Self-pay | Admitting: Family Medicine

## 2016-02-25 VITALS — BP 114/76 | Temp 98.0°F | Ht 65.0 in | Wt 194.0 lb

## 2016-02-25 DIAGNOSIS — R309 Painful micturition, unspecified: Secondary | ICD-10-CM

## 2016-02-25 DIAGNOSIS — N3001 Acute cystitis with hematuria: Secondary | ICD-10-CM

## 2016-02-25 LAB — POCT URINALYSIS DIPSTICK
Blood, UA: NEGATIVE
PH UA: 6
Spec Grav, UA: <=1.005

## 2016-02-25 MED ORDER — CIPROFLOXACIN HCL 500 MG PO TABS: 500.0000 mg | ORAL_TABLET | Freq: Two times a day (BID) | ORAL | 0 refills | Status: AC

## 2016-02-25 NOTE — Progress Notes (Signed)
   Subjective:    Patient ID: Brianna Davis, female    DOB: 03-Aug-1975, 40 y.o.   MRN: [id number]  Urinary Tract Infection   This is a new problem. The current episode started in the past 7 days. Associated symptoms comments: Painful urination, abdominal pain. She has tried antibiotics and acetaminophen for the symptoms. The treatment provided mild relief.   fri night developed urgency and discomfort  Then freq urination  Pt had a little keflex left over--took a little  Got better saw some blood wsaturday     Got better last couple days   Mild discomfort off and on  Slight discomfort in June  Took no meds for pain, took hydrocod  Took urin spasm meds  No fever or but some mild chills Patient states no other concerns this visit.  Review of Systems    No headache, no major weight loss or weight gain, no chest pain no back pain abdominal pain no change in bowel habits complete ROS otherwise negative  Objective:   Physical Exam  Alert vitals stable, NAD. Blood pressure good on repeat. HEENT normal. Lungs clear. Heart regular rate and rhythm. Slight right flank tenderness to percussion  Urinalysis 0-1 red blood cells 4-6 white blood cells      Assessment & Plan:  Impression urinary tract infection with possible element of right-sided pyelonephritis. Doubt stone present history not consistent with colicky nature, however cannot 100% rule out without costly tests. Plan trial of Cipro 500 twice a day 7 days. Culture urine

## 2016-02-27 LAB — URINE CULTURE

## 2016-04-15 ENCOUNTER — Other Ambulatory Visit: Payer: Self-pay | Admitting: Obstetrics and Gynecology

## 2016-04-16 LAB — CYTOLOGY - PAP

## 2016-06-03 ENCOUNTER — Telehealth: Payer: Self-pay | Admitting: *Deleted

## 2016-06-03 MED ORDER — ALPRAZOLAM 0.5 MG PO TABS: ORAL_TABLET | ORAL | 0 refills | Status: DC

## 2016-06-03 MED ORDER — CYCLOBENZAPRINE HCL 5 MG PO TABS: 5.0000 mg | ORAL_TABLET | Freq: Three times a day (TID) | ORAL | 0 refills | Status: DC | PRN

## 2016-06-03 NOTE — Telephone Encounter (Signed)
Pt called yesterday trying to get appt. Was told to call back today and she did. Having back pain for the past 3 -4 days. Has degenerative disc disease. Has taken flexeril in the past but has not taken in over 2 years. Also wants to know if something for anxiety can be called in since she cannot get appt til next week. Has taken xanax in the past. Pt is not sucidial. [facility].

## 2016-06-03 NOTE — Telephone Encounter (Signed)
#  1 Flexeril 5 mg tablet one 3 times a day when necessary back discomfort home use only #21 #2-Xanax 0.5 mg-#12-one half to one twice a day when necessary anxiety not use caution drowsiness. Finally keep hollow up office visit next week

## 2016-06-03 NOTE — Telephone Encounter (Signed)
Notified patient #1 Flexeril 5 mg tablet one 3 times a day when necessary back discomfort home use only #21 #2-Xanax 0.5 mg-#12-one half to one twice a day when necessary anxiety not use caution drowsiness. Finally keep follow up office visit next week. Meds sent pharmacy. Patient verbalized understanding.

## 2016-06-08 ENCOUNTER — Encounter: Payer: Self-pay | Admitting: Family Medicine

## 2016-06-08 ENCOUNTER — Ambulatory Visit (INDEPENDENT_AMBULATORY_CARE_PROVIDER_SITE_OTHER): Payer: 59 | Admitting: Family Medicine

## 2016-06-08 VITALS — BP 136/94 | Ht 65.0 in | Wt 197.0 lb

## 2016-06-08 DIAGNOSIS — M549 Dorsalgia, unspecified: Secondary | ICD-10-CM

## 2016-06-08 DIAGNOSIS — F411 Generalized anxiety disorder: Secondary | ICD-10-CM | POA: Diagnosis not present

## 2016-06-08 DIAGNOSIS — M5432 Sciatica, left side: Secondary | ICD-10-CM | POA: Diagnosis not present

## 2016-06-08 MED ORDER — MELOXICAM 15 MG PO TABS
15.0000 mg | ORAL_TABLET | Freq: Every day | ORAL | 0 refills | Status: DC
Start: 2016-06-08 — End: 2016-09-14

## 2016-06-08 NOTE — Progress Notes (Signed)
   Subjective:    Patient ID: Brianna Davis, female    DOB: Sep 06, 1975, 40 y.o.   MRN: [id number]  Back Pain  This is a chronic problem. The current episode started more than 1 year ago (getting worse the past week). The pain radiates to the left thigh. The symptoms are aggravated by lying down. Pertinent negatives include no abdominal pain or fever. Treatments tried: flexeril. The treatment provided no relief.   Anxiety. Has some personal issues. Feeling overwhelmed. Xanax was sent to pharm but has not started yet. Pt did not want to take with flexeril.   Review of Systems  Constitutional: Negative for fatigue and fever.  Respiratory: Negative for cough.   Gastrointestinal: Negative for abdominal pain.  Musculoskeletal: Positive for back pain.       Objective:   Physical Exam  Constitutional: She appears well-nourished. No distress.  Cardiovascular: Normal rate, regular rhythm and normal heart sounds.   No murmur heard. Pulmonary/Chest: Effort normal and breath sounds normal. No respiratory distress.  Musculoskeletal: She exhibits no edema.  Lymphadenopathy:    She has no cervical adenopathy.  Neurological: She is alert. She exhibits normal muscle tone.  Psychiatric: Her behavior is normal.  Vitals reviewed.   Negative straight leg raise. Intermittent sciatica left leg. Will try anti-inflammatory.  Mid back pain probably related to stress or back issues stretching exercises recommend Flexeril only when at home    Assessment & Plan:  Significant stress anxiety issues may use Xanax when necessary do not combine with Flexeril patient not depressed her husband has some serious health issues possible renal cancer plus also some marriage issues which they are trying to work through  To musculoskeletal strain recommend patient do the best job she can with stretching exercises anti-inflammatory when necessary. Follow-up if ongoing troubles.   paSlightly elevated blood pressure  recommend patient follow-up for nurse visit in several weeks to recheck blood pressuretient with severe back pain and discomfort.

## 2016-06-08 NOTE — Patient Instructions (Signed)

## 2016-06-09 NOTE — Progress Notes (Signed)
Patient scheduled follow up office visit to recheck blood pressure in 4-6 weeks

## 2016-07-14 ENCOUNTER — Ambulatory Visit: Payer: 59 | Admitting: Family Medicine

## 2016-07-19 ENCOUNTER — Telehealth: Payer: Self-pay | Admitting: Family Medicine

## 2016-07-19 NOTE — Telephone Encounter (Signed)
So in a situation like this pain medication such as hydrocodone would be reasonable to use intermittently but we would recommend to avoid using it on a frequent basis because of risk of overusage addiction etc. find out from the patient if she once to try hydrocodone for intermittent use

## 2016-07-19 NOTE — Telephone Encounter (Signed)
Patient states that she has some oxycodone from her kidney stone back in July that she will take.

## 2016-07-19 NOTE — Telephone Encounter (Signed)
Pt called stating that she is still having a lot of pain with her sciatica and is not needing something everyday but is needing something intermittently. Please advise.

## 2016-09-10 ENCOUNTER — Ambulatory Visit (INDEPENDENT_AMBULATORY_CARE_PROVIDER_SITE_OTHER): Payer: 59 | Admitting: Family Medicine

## 2016-09-10 ENCOUNTER — Encounter: Payer: Self-pay | Admitting: Family Medicine

## 2016-09-10 VITALS — BP 122/82 | Ht 66.0 in | Wt 203.8 lb

## 2016-09-10 DIAGNOSIS — F411 Generalized anxiety disorder: Secondary | ICD-10-CM | POA: Insufficient documentation

## 2016-09-10 MED ORDER — ALPRAZOLAM 0.5 MG PO TABS: ORAL_TABLET | ORAL | 0 refills | Status: DC

## 2016-09-10 MED ORDER — SERTRALINE HCL 50 MG PO TABS: 50.0000 mg | ORAL_TABLET | Freq: Every day | ORAL | 3 refills | Status: DC

## 2016-09-10 NOTE — Progress Notes (Signed)
   Subjective:    Patient ID: Brianna Davis, female    DOB: [birth year], 41 y.o.   MRN: [id number]  HPI Patient arrives to discuss problems with anxiety Long discussion held with anxiety. She is in a very difficult situation at home. Her husband works and does seem to care for her as well as the children but does not go out much with them. There is never a date night. He does not help as much as he should with the childcare. He plays video games on Thursday nights Friday nights most to Saturdays and Sundays. This limits interaction with family. This is created a lot of stress for the patient. She works full-time at home and also takes care of her child full-time is been under a lot of stress denies being depressed but finds herself feeling anxious and worried on regular basis  Depression questionnaire filled out by the patient this was negative except for a couple areas of fatigue and tiredness  Anxiety questionnaire filled out shows strong amount of anxiety and stress-related issues  Review of Systems     Objective:   Physical Exam  Patient not suicidal or homicidal Physical exam was not completed because this was not the focus of the issue. 25 minutes was spent with the patient discussing the stress and anxiety issues and discussing medication options and answering questions.     Assessment & Plan:  I recommend counseling she will get this through a her pastor.  She will follow-up here in 2-3 weeks  Generalized anxiety disorder Zoloft 50 mg one half daily for the first week then 1 daily after that  Xanax when necessary caution drowsiness only for infrequent use  Patient is not depressed but she is very stressed about was going on in her household I've encouraged her to talk with her husband. Also to talk with the pastor or counselor before making any decisions

## 2016-09-14 ENCOUNTER — Encounter (HOSPITAL_COMMUNITY): Payer: Self-pay | Admitting: *Deleted

## 2016-09-14 ENCOUNTER — Emergency Department (HOSPITAL_COMMUNITY)
Admission: EM | Admit: 2016-09-14 | Discharge: 2016-09-14 | Disposition: A | Discharge status: Home / Self Care | Payer: 59 | Attending: Emergency Medicine | Admitting: Emergency Medicine

## 2016-09-14 ENCOUNTER — Emergency Department (HOSPITAL_COMMUNITY): Payer: 59

## 2016-09-14 DIAGNOSIS — R1011 Right upper quadrant pain: Secondary | ICD-10-CM | POA: Diagnosis not present

## 2016-09-14 DIAGNOSIS — Z87891 Personal history of nicotine dependence: Secondary | ICD-10-CM | POA: Diagnosis not present

## 2016-09-14 LAB — URINALYSIS, ROUTINE W REFLEX MICROSCOPIC
Bilirubin Urine: NEGATIVE
GLUCOSE, UA: NEGATIVE mg/dL
HGB URINE DIPSTICK: NEGATIVE
KETONES UR: NEGATIVE mg/dL
Nitrite: NEGATIVE
Protein, ur: NEGATIVE mg/dL
Specific Gravity, Urine: 1.011 (ref 1.005–1.030)
pH: 6 (ref 5.0–8.0)

## 2016-09-14 LAB — COMPREHENSIVE METABOLIC PANEL
ALBUMIN: 4.2 g/dL (ref 3.5–5.0)
ALK PHOS: 91 U/L (ref 38–126)
ALT: 16 U/L (ref 14–54)
AST: 19 U/L (ref 15–41)
Anion gap: 7 (ref 5–15)
BUN: 11 mg/dL (ref 6–20)
CALCIUM: 9.6 mg/dL (ref 8.9–10.3)
CO2: 27 mmol/L (ref 22–32)
Chloride: 104 mmol/L (ref 101–111)
Creatinine, Ser: 0.81 mg/dL (ref 0.44–1.00)
GFR calc Af Amer: >60 mL/min (ref >60)
GFR calc non Af Amer: >60 mL/min (ref >60)
GLUCOSE: 109 mg/dL — AB (ref 65–99)
Potassium: 3.6 mmol/L (ref 3.5–5.1)
SODIUM: 138 mmol/L (ref 135–145)
Total Bilirubin: 0.2 mg/dL — ABNORMAL LOW (ref 0.3–1.2)
Total Protein: 7.6 g/dL (ref 6.5–8.1)

## 2016-09-14 LAB — PREGNANCY, URINE: Preg Test, Ur: NEGATIVE

## 2016-09-14 LAB — CBC WITH DIFFERENTIAL/PLATELET
Basophils Absolute: 0 10*3/uL (ref 0.0–0.1)
Basophils Relative: 0 %
EOS ABS: 0.4 10*3/uL (ref 0.0–0.7)
EOS PCT: 6 %
HCT: 39.8 % (ref 36.0–46.0)
HEMOGLOBIN: 13.5 g/dL (ref 12.0–15.0)
LYMPHS ABS: 3.2 10*3/uL (ref 0.7–4.0)
Lymphocytes Relative: 42 %
MCH: 30.5 pg (ref 26.0–34.0)
MCHC: 33.9 g/dL (ref 30.0–36.0)
MCV: 90 fL (ref 78.0–100.0)
MONOS PCT: 8 %
Monocytes Absolute: 0.6 10*3/uL (ref 0.1–1.0)
Neutro Abs: 3.3 10*3/uL (ref 1.7–7.7)
Neutrophils Relative %: 44 %
PLATELETS: 305 10*3/uL (ref 150–400)
RBC: 4.42 MIL/uL (ref 3.87–5.11)
RDW: 12.8 % (ref 11.5–15.5)
WBC: 7.5 10*3/uL (ref 4.0–10.5)

## 2016-09-14 LAB — LIPASE, BLOOD: Lipase: 27 U/L (ref 11–51)

## 2016-09-14 MED ORDER — FENTANYL CITRATE (PF) 100 MCG/2ML IJ SOLN
50.0000 ug | Freq: Once | INTRAMUSCULAR | Status: AC
  Administered 2016-09-14: 50 ug via INTRAVENOUS
  Filled 2016-09-14: qty 2

## 2016-09-14 MED ORDER — IOPAMIDOL (ISOVUE-300) INJECTION 61%
100.0000 mL | Freq: Once | INTRAVENOUS | Status: AC | PRN
  Administered 2016-09-14: 100 mL via INTRAVENOUS

## 2016-09-14 MED ORDER — IOPAMIDOL (ISOVUE-300) INJECTION 61%
INTRAVENOUS | Status: AC
  Filled 2016-09-14: qty 30

## 2016-09-14 MED ORDER — HYDROCODONE-ACETAMINOPHEN 5-325 MG PO TABS: 1.0000 | ORAL_TABLET | ORAL | 0 refills | Status: DC | PRN

## 2016-09-14 NOTE — ED Triage Notes (Signed)
Pt reports right sided flank pain that radiates around to her right lower abdomen since around lunch time today.

## 2016-09-14 NOTE — ED Provider Notes (Signed)
[facility]PT Provider Note   CSN: [id number] Arrival date & time: 09/14/16  2007  By signing my name below, I, Brianna Davis, attest that this documentation has been prepared under the direction and in the presence of Brianna Core, MD. Electronically Signed: Modena Davis, Scribe. 09/14/2016. 8:24 PM.  History   Chief Complaint Chief Complaint  Patient presents with  . Flank Pain   The history is provided by the patient. No language interpreter was used.   HPI Comments: Brianna Davis is a 41 y.o. female with a PMHx of IBS and Nephrolithiasis who presents to the Emergency Department complaining of constant moderate right flank pain that started about 6 hours ago. She suspects she has a kidney stone (last stone: June 2017). She describes the pain as a waxing/waning sensation radiating around to her right-sided abdomen. She denies any hx of cholecystectomy, urinary symptoms, or other complaints.     PCP: Lilyan Punt, MD  Past Medical History:  Diagnosis Date  . Anxiety   . Degenerative disc disease   . Degenerative disc disease, lumbar   . IBS (irritable bowel syndrome)   . Migraine   . Nephrolithiasis   . S/P endoscopy April 2012   Dr. Jena Gauss: friability at GE junction, negative Barrett's orH.pylori, diffuse gastric petechia    Patient Active Problem List   Diagnosis Date Noted  . Generalized anxiety disorder 09/10/2016  . Postpartum care following cesarean delivery (12/19) 06/08/2014  . Postoperative state 06/08/2014  . Normal labor 06/06/2014  . Pregnancy 05/17/2014  . Gestational hypertension w/o significant proteinuria in 3rd trimester 05/15/2014  . Acute pyelonephritis 09/11/2012  . GERD (gastroesophageal reflux disease) 09/28/2010  . Epigastric pain 09/28/2010  . Dysphagia 09/28/2010  . IBS (irritable bowel syndrome) 09/28/2010  . Esophageal dysphagia 09/28/2010    Past Surgical History:  Procedure Laterality Date  . CESAREAN SECTION N/A  06/07/2014   Procedure: CESAREAN SECTION;  Surgeon: Genia Del, MD;  Location: [facility];  Service: Obstetrics;  Laterality: N/A;  . CHROMOPERTUBATION Bilateral 04/03/2013   Procedure: CHROMOPERTUBATION with Methylene Blue, with patent right tube and proximal obstruction of left tube;  Surgeon: Genia Del, MD;  Location: [facility];  Service: Gynecology;  Laterality: Bilateral;  . ESOPHAGOGASTRODUODENOSCOPY  09/30/2010   WUJ:WJXBJ hiatal hernia/Friability at the gastroesophageal junction with undulating Z-line versus short segment Barrett esophagus s/p bx  . ileocolonoscopy  12/2007   Friable anal canal.  . LAPAROSCOPY N/A 04/03/2013   Procedure: LAPAROSCOPY DIAGNOSTIC;  Surgeon: Genia Del, MD;  Location: [facility];  Service: Gynecology;  Laterality: N/A;  . ROBOTIC ASSISTED LAPAROSCOPIC LYSIS OF ADHESION N/A 04/03/2013   Procedure: ROBOTIC ASSISTED LAPAROSCOPIC LYSIS OF ADHESION; LYSIS OF ENDOMETRIOSIS ;  Surgeon: Genia Del, MD;  Location: [facility];  Service: Gynecology;  Laterality: N/A;    OB History    Gravida Para Term Preterm AB Living   2 1 1   1 1    SAB TAB Ectopic Multiple Live Births   1     0 1       Home Medications    Prior to Admission medications   Medication Sig Start Date End Date Taking? Authorizing Provider  ALPRAZolam (XANAX) 0.5 MG tablet Take 1/2- 1 tablet BID prn anxiety Patient taking differently: Take 0.5-1 mg by mouth 2 (two) times daily as needed for anxiety.  09/10/16   Babs Sciara, MD  HYDROcodone-acetaminophen (NORCO/VICODIN) 5-325 MG tablet Take 1-2 tablets by mouth every 4 (four) hours as needed. 09/14/16  Brianna Core, MD  sertraline (ZOLOFT) 50 MG tablet Take 1 tablet (50 mg total) by mouth daily. 09/10/16   Babs Sciara, MD    Family History Family History  Problem Relation Age of Onset  . Irritable bowel syndrome Mother   . GI problems Sister     gastroparesis  . Colon cancer Neg Hx   . Inflammatory bowel disease Neg  Hx   . Liver disease Neg Hx     Social History Social History  Substance Use Topics  . Smoking status: Former Smoker    Packs/day: 1.00    Years: 14.00    Types: Cigarettes    Start date: 03/08/1996    Quit date: 09/25/2013  . Smokeless tobacco: Never Used  . Alcohol use No     Allergies   Dilaudid [hydromorphone hcl]   Review of Systems Review of Systems  Gastrointestinal: Positive for abdominal pain (Right-sided).  Genitourinary: Positive for flank pain (Right). Negative for dysuria.  All other systems reviewed and are negative.    Physical Exam Updated Vital Signs BP (!) 152/96 (BP Location: Left Arm)   Pulse 96   Temp 98.1 F (36.7 C) (Oral)   Resp 20   Ht 5\' 6"  (1.676 m)   Wt 200 lb (90.7 kg)   LMP  (LMP Unknown)   SpO2 100%   BMI 32.28 kg/m   Physical Exam  Constitutional: She appears well-developed and well-nourished. No distress.  Appears uncomfortable.   HENT:  Head: Normocephalic.  Eyes: Conjunctivae are normal.  Neck: Neck supple.  Cardiovascular: Normal rate and regular rhythm.   Pulmonary/Chest: Effort normal.  Abdominal: Soft. She exhibits no mass. There is tenderness.  RUQ TTP. No mass.   Genitourinary:  Genitourinary Comments: No CVA tenderness.   Musculoskeletal: Normal range of motion.  Neurological: She is alert.  Skin: Skin is warm and dry.  Psychiatric: She has a normal mood and affect.  Nursing note and vitals reviewed.    ED Treatments / Results  DIAGNOSTIC STUDIES: Oxygen Saturation is 100% on RA, normal by my interpretation.    COORDINATION OF CARE: 8:28 PM- Pt advised of plan for treatment and pt agrees.  Labs (all labs ordered are listed, but only abnormal results are displayed) Labs Reviewed  URINALYSIS, ROUTINE W REFLEX MICROSCOPIC - Abnormal; Notable for the following:       Result Value   APPearance HAZY (*)    Leukocytes, UA TRACE (*)    Bacteria, UA RARE (*)    Squamous Epithelial / LPF 6-30 (*)    All  other components within normal limits  COMPREHENSIVE METABOLIC PANEL - Abnormal; Notable for the following:    Glucose, Bld 109 (*)    Total Bilirubin 0.2 (*)    All other components within normal limits  PREGNANCY, URINE  LIPASE, BLOOD  CBC WITH DIFFERENTIAL/PLATELET    EKG  EKG Interpretation None       Radiology Ct Abdomen Pelvis W Contrast  Result Date: 09/14/2016 CLINICAL DATA:  Right upper quadrant and right flank pain. EXAM: CT ABDOMEN AND PELVIS WITH CONTRAST TECHNIQUE: Multidetector CT imaging of the abdomen and pelvis was performed using the standard protocol following bolus administration of intravenous contrast. CONTRAST:  ISOVUE-300 IOPAMIDOL (ISOVUE-300) INJECTION 61% COMPARISON:  None. FINDINGS: Lower chest: No acute abnormality. Hepatobiliary: Distended appearing gallbladder with slight gallbladder wall thickening up to 4 mm. No calculi identified. Acalculous cholecystitis is not entirely excluded consider HIDA scan for further correlation. Homogeneous appearance of  the liver without biliary dilatation. Pancreas: Normal Spleen: Normal Adrenals/Urinary Tract: Adrenal glands are unremarkable. 4 mm nonobstructing interpolar left renal calculus. No obstructive uropathy on either side. No renal mass. Bladder is unremarkable. Stomach/Bowel: Contrast filled stomach. Normal small bowel rotation without obstruction or inflammation. Appendix not visualized but no pericecal inflammation is noted. Appendix visualized on the coronal images and is normal in caliber. Moderate colonic stool burden from cecum to Vascular/Lymphatic: No significant vascular findings are present. No enlarged abdominal or pelvic lymph nodes. Reproductive: Hemorrhagic cyst or corpus luteum associated with the right ovary measuring 1.6 cm in diameter. Unremarkable appearance the uterus and left adnexa. Other: No abdominal wall hernia or abnormality. No abdominopelvic ascites. Musculoskeletal: No acute or  significant osseous findings. Mild disc bulges at L4-5 and L5-S1. IMPRESSION: 1. Moderately distended gallbladder with borderline thickened gallbladder wall but no cholelithiasis. Acalculus cholecystitis is not entirely excluded. Consider HIDA scan for further workup if clinically suspect. 2. Nonobstructing left renal 4 mm calculus. 3. Peripherally enhancing 1.6 cm cyst possibly representing the corpus luteum in the right ovary versus hemorrhagic cyst. Electronically Signed   By: Tollie Ethavid  Kwon M.D.   On: 09/14/2016 21:59    Procedures Procedures (including critical care time)  Medications Ordered in ED Medications  iopamidol (ISOVUE-300) 61 % injection (not administered)  fentaNYL (SUBLIMAZE) injection 50 mcg (50 mcg Intravenous Given 09/14/16 2100)  iopamidol (ISOVUE-300) 61 % injection 100 mL (100 mLs Intravenous Contrast Given 09/14/16 2131)     Initial Impression / Assessment and Plan / ED Course  I have reviewed the triage vital signs and the nursing notes.  Pertinent labs & imaging results that were available during my care of the patient were reviewed by me and considered in my medical decision making (see chart for details).     Patient with right upper quadrant pain and tenderness. Lab work is reassuring. However her CT scan does show an enlarged gallbladder with some mild wall thickening. Feels somewhat better after treatment however. Apparently has had a hiatus scan around 10 years ago. Not associated with food. Discussed with Dr. Lovell SheehanJenkins recommends follow-up with her PCP for further evaluation. Will discharge home.  Final Clinical Impressions(s) / ED Diagnoses   Final diagnoses:  Right upper quadrant abdominal pain    New Prescriptions New Prescriptions   HYDROCODONE-ACETAMINOPHEN (NORCO/VICODIN) 5-325 MG TABLET    Take 1-2 tablets by mouth every 4 (four) hours as needed.   I personally performed the services described in this documentation, which was scribed in my presence.  The recorded information has been reviewed and is accurate.       Brianna CoreNathan Miho Monda, MD 09/14/16 [phone]

## 2016-09-14 NOTE — Discharge Instructions (Signed)
Your CT scan showed some enlargement of the gallbladder and some wall thickening. Follow-up with Dr. Gerda DissLuking for further evaluation with likely either ultrasound or HIDA scan.

## 2016-09-20 ENCOUNTER — Encounter: Payer: Self-pay | Admitting: Family Medicine

## 2016-09-20 ENCOUNTER — Ambulatory Visit (INDEPENDENT_AMBULATORY_CARE_PROVIDER_SITE_OTHER): Payer: 59 | Admitting: Family Medicine

## 2016-09-20 VITALS — BP 122/80 | Temp 98.8°F | Ht 66.0 in | Wt 200.6 lb

## 2016-09-20 DIAGNOSIS — R1011 Right upper quadrant pain: Secondary | ICD-10-CM

## 2016-09-20 NOTE — Progress Notes (Signed)
   Subjective:    Patient ID: Brianna Davis, female    DOB: 26-Oct-1975, 41 y.o.   MRN: [id number]  Abdominal Pain  This is a new problem. Episode onset: one week. Pain location: right side. Associated symptoms include diarrhea, a fever, nausea and vomiting.  Went to ED on 3/27. Had CT scan.   Significant right upper quadrant tenderness and discomfort worse with eating especially fatty items denies any particular other health problems injuries or other issues going on.  Review of Systems  Constitutional: Positive for fever.  HENT: Negative for congestion.   Respiratory: Negative for cough and shortness of breath.   Cardiovascular: Negative for chest pain.  Gastrointestinal: Positive for abdominal pain, diarrhea, nausea and vomiting.       Objective:   Physical Exam  Constitutional: She appears well-nourished. No distress.  Cardiovascular: Normal rate, regular rhythm and normal heart sounds.   No murmur heard. Pulmonary/Chest: Effort normal and breath sounds normal. No respiratory distress.  Abdominal: Soft. She exhibits no distension. There is tenderness. There is no rebound and no guarding.  Musculoskeletal: She exhibits no edema.  Lymphadenopathy:    She has no cervical adenopathy.  Neurological: She is alert. She exhibits normal muscle tone.  Psychiatric: Her behavior is normal.  Vitals reviewed.         Assessment & Plan:  Significant right upper quadrant pain discomfort with a distended gallbladder on CAT scan consistent with gallbladder dysfunction a HIDA test ordered more than likely will need referral to surgery long discussion held with the patient she understands all the what goes with this she will follow-up if any other problems

## 2016-09-23 ENCOUNTER — Encounter (HOSPITAL_COMMUNITY): Payer: Self-pay

## 2016-09-23 ENCOUNTER — Encounter (HOSPITAL_COMMUNITY)
Admission: RE | Admit: 2016-09-23 | Discharge: 2016-09-23 | Disposition: A | Discharge status: Home / Self Care | Payer: 59 | Source: Ambulatory Visit | Attending: Family Medicine | Admitting: Family Medicine

## 2016-09-23 DIAGNOSIS — R1011 Right upper quadrant pain: Secondary | ICD-10-CM | POA: Diagnosis not present

## 2016-09-23 MED ORDER — TECHNETIUM TC 99M MEBROFENIN IV KIT
5.0000 | PACK | Freq: Once | INTRAVENOUS | Status: AC | PRN
  Administered 2016-09-23: 5.4 via INTRAVENOUS

## 2016-10-01 ENCOUNTER — Ambulatory Visit: Payer: 59 | Admitting: Family Medicine

## 2016-10-14 ENCOUNTER — Encounter: Payer: Self-pay | Admitting: Nurse Practitioner

## 2016-10-14 ENCOUNTER — Ambulatory Visit (INDEPENDENT_AMBULATORY_CARE_PROVIDER_SITE_OTHER): Payer: 59 | Admitting: Nurse Practitioner

## 2016-10-14 VITALS — BP 131/83 | HR 86 | Temp 97.6°F | Ht 64.0 in | Wt 199.8 lb

## 2016-10-14 DIAGNOSIS — K219 Gastro-esophageal reflux disease without esophagitis: Secondary | ICD-10-CM | POA: Diagnosis not present

## 2016-10-14 DIAGNOSIS — R110 Nausea: Secondary | ICD-10-CM | POA: Diagnosis not present

## 2016-10-14 DIAGNOSIS — R1011 Right upper quadrant pain: Secondary | ICD-10-CM | POA: Insufficient documentation

## 2016-10-14 MED ORDER — PANTOPRAZOLE SODIUM 40 MG PO TBEC: 40.0000 mg | DELAYED_RELEASE_TABLET | Freq: Every day | ORAL | 3 refills | Status: DC

## 2016-10-14 NOTE — Assessment & Plan Note (Signed)
Nausea associated with her other symptoms. We'll wake up in the middle the night very nauseous. Symptoms wax and wane in intensity. Has not vomited. Further workup as per above. Return for follow-up in 6 weeks.

## 2016-10-14 NOTE — Progress Notes (Signed)
Primary Care Physician:  Lilyan Punt, MD Primary Gastroenterologist:  Dr. Jena Gauss  Chief Complaint  Patient presents with  . Nausea  . Abdominal Pain    RUQ and into back, had HIDA, "tender"  . Emesis    occ    HPI:   Brianna Davis is a 41 y.o. female who presents for evaluation of nausea. The patient last seen in our office 10/15/2010 for dysphagia, GERD. At that time she had recently undergone EGD with findings of friability at the GE junction, gastric petechia, H. pylori, Barrett's. She was started on Dexilant noted significant improvement. Overall doing well. She has been a no-show for her past 2 follow-up appointments. She was seen about 1 month ago in emergency department for flank pain with reassuring labs and CT scan with enlarged gallbladder and some mild wall thickening. Surgery consult recommended follow-up with primary care for further evaluation. Primary care completed a HIDA scan which showed normal gallbladder function and recommended GI referral and starting Protonix 40 mg once a day. The patient verbalized that she has a gastroenterologist and does not feel like she needs to start Protonix.  Today she states she is doing ok overall. Hasn't taken anything for her stomach in a long while. Has a lot of bloating and occasional GERD but "I'm not a fan of medicines." Has history of IBS, occasional bouts of diarrhea, occasional normal stools. Has RUQ sensitivity/pain. Worsening nausea. Pain in her back as well. Was told by the ER they thought her enlarged gallbladder was the source of her problems. Back pain is worsening and becoming more frequent; particularly worse on Monday, not so bad Tuesday. Will last 30 mins to a few hours. Pain has started working its way up into her right shoulder. Sometimes worse with food, sometimes not. GERD symptoms diet dependent and with esophageal burning. Some worsening RUQ pain worse with deep inspiration. Denies hematochezia, melena,  unintentional weight loss, fever, chills. Denies chest pain, dyspnea, dizziness, lightheadedness, syncope, near syncope. Denies any other upper or lower GI symptoms.  Past Medical History:  Diagnosis Date  . Anxiety   . Degenerative disc disease   . Degenerative disc disease, lumbar   . IBS (irritable bowel syndrome)   . Migraine   . Nephrolithiasis   . S/P endoscopy April 2012   Dr. Jena Gauss: friability at GE junction, negative Barrett's orH.pylori, diffuse gastric petechia    Past Surgical History:  Procedure Laterality Date  . CESAREAN SECTION N/A 06/07/2014   Procedure: CESAREAN SECTION;  Surgeon: Genia Del, MD;  Location: [facility];  Service: Obstetrics;  Laterality: N/A;  . CHROMOPERTUBATION Bilateral 04/03/2013   Procedure: CHROMOPERTUBATION with Methylene Blue, with patent right tube and proximal obstruction of left tube;  Surgeon: Genia Del, MD;  Location: [facility];  Service: Gynecology;  Laterality: Bilateral;  . ESOPHAGOGASTRODUODENOSCOPY  09/30/2010   ZOX:WRUEA hiatal hernia/Friability at the gastroesophageal junction with undulating Z-line versus short segment Barrett esophagus s/p bx  . ileocolonoscopy  12/2007   Friable anal canal.  . LAPAROSCOPY N/A 04/03/2013   Procedure: LAPAROSCOPY DIAGNOSTIC;  Surgeon: Genia Del, MD;  Location: [facility];  Service: Gynecology;  Laterality: N/A;  . ROBOTIC ASSISTED LAPAROSCOPIC LYSIS OF ADHESION N/A 04/03/2013   Procedure: ROBOTIC ASSISTED LAPAROSCOPIC LYSIS OF ADHESION; LYSIS OF ENDOMETRIOSIS ;  Surgeon: Genia Del, MD;  Location: [facility];  Service: Gynecology;  Laterality: N/A;    Current Outpatient Prescriptions  Medication Sig Dispense Refill  . HYDROcodone-acetaminophen (NORCO/VICODIN) 5-325 MG tablet Take  1-2 tablets by mouth every 4 (four) hours as needed. 6 tablet 0   No current facility-administered medications for this visit.     Allergies as of 10/14/2016 - Review Complete 10/14/2016  Allergen  Reaction Noted  . Dilaudid [hydromorphone hcl] Nausea And Vomiting 03/28/2013    Family History  Problem Relation Age of Onset  . Irritable bowel syndrome Mother   . GI problems Sister     gastroparesis  . Colon cancer Neg Hx   . Inflammatory bowel disease Neg Hx   . Liver disease Neg Hx     Social History   Social History  . Marital status: Married    Spouse name: N/A  . Number of children: 0  . Years of education: N/A   Occupational History  .  [employer]    works from home   Social History Main Topics  . Smoking status: Former Smoker    Packs/day: 1.00    Years: 14.00    Types: Cigarettes    Start date: 03/08/1996    Quit date: 09/25/2013  . Smokeless tobacco: Never Used  . Alcohol use No  . Drug use: No  . Sexual activity: Yes    Partners: Male    Birth control/ protection: None     Comment: spouse   Other Topics Concern  . Not on file   Social History Narrative  . No narrative on file    Review of Systems: General: Negative for anorexia, weight loss, fever, chills, fatigue, weakness. ENT: Negative for hoarseness, difficulty swallowing. CV: Negative for chest pain, angina, palpitations, peripheral edema.  Respiratory: Negative for dyspnea at rest, cough, sputum, wheezing.  GI: See history of present illness. MS: Back pain as per HPI.  Derm: Negative for rash or itching.   Endo: Negative for unusual weight change.  Heme: Negative for bruising or bleeding. Allergy: Negative for rash or hives.    Physical Exam: BP 131/83   Pulse 86   Temp 97.6 F (36.4 C) (Oral)   Ht  (1.626 m)   Wt 199 lb 12.8 oz (90.6 kg)   LMP 09/23/2016 (Exact Date)   BMI 34.30 kg/m  General:   Alert and oriented. Pleasant and cooperative. Well-nourished and well-developed. Obese female. Head:  Normocephalic and atraumatic. Eyes:  Without icterus, sclera clear and conjunctiva pink.  Ears:  Normal auditory acuity. Cardiovascular:  S1, S2 present without murmurs  appreciated. Extremities without clubbing or edema. Respiratory:  Clear to auscultation bilaterally. No wheezes, rales, or rhonchi. No distress.  Gastrointestinal:  +BS, obese but soft, non-distended. RUQ TTP worsening TTP with hooking (Murphy's Sign). No HSM noted. No guarding or rebound. No masses appreciated.  Rectal:  Deferred  Musculoskalatal:  Symmetrical without gross deformities. Normal posture. Neurologic:  Alert and oriented x4;  grossly normal neurologically. Psych:  Alert and cooperative. Normal mood and affect. Heme/Lymph/Immune: No excessive bruising noted.    10/14/2016 11:54 AM   Disclaimer: This note was dictated with voice recognition software. Similar sounding words can inadvertently be transcribed and may not be corrected upon review.

## 2016-10-14 NOTE — Assessment & Plan Note (Signed)
GERD symptoms not adequately controlled because she has not wanted to take a PPI. Occasional reflux. If she doesn't of needing to be evaluated by surgery they will want her GERD symptoms under control in order to help clear the picture if she does have gallbladder issues as the main source of her problem. I discussed this with her and she is in agreement. She will start Protonix 40 mg once daily after completing do urea breath test for H. pylori. Return for follow-up in 6 weeks.

## 2016-10-14 NOTE — Assessment & Plan Note (Signed)
Right upper quadrant pain which radiates to the epigastric area as well as to her right side/back and sometimes up into her shoulder. Her pain is becoming more intense and more frequent. Waxes and wanes in intensity. Last 30 minutes to several hours. Sometimes associated with eating, sometimes not. She has a history of H. pylori and I'll check a urea breath test to make sure she is not positive for H. pylori again. When she finishes her breath test, she will start Protonix 40 mg once a day. Return for follow-up in 6 weeks to see if her tonics is helped her symptoms. If not, then we can consider referring to surgery for further evaluation. The picture is not quite clear because her HIDA scan was approved by her insurance although they did not approve the ejection fraction portion so it is unclear as to the true function of her gallbladder ejection.

## 2016-10-14 NOTE — Patient Instructions (Signed)
1. Go to the lab across the street from [facility] emergency room and have your breath test done to check for H. pylori. 2. After you've completed this test, start Protonix 40 mg once a day. I've sent to send your pharmacy. 3. Return for follow-up in 6 weeks. 4. As we discussed, if you have worsening or severe symptoms call our office or proceed to the emergency room urgent care. 5. Call if you have any questions or concerns.

## 2016-10-14 NOTE — Progress Notes (Signed)
cc'ed to pcp °

## 2016-10-15 ENCOUNTER — Ambulatory Visit: Payer: 59 | Admitting: Family Medicine

## 2016-10-18 LAB — H. PYLORI BREATH TEST: H. PYLORI BREATH TEST: NOT DETECTED

## 2016-10-28 ENCOUNTER — Encounter: Payer: Self-pay | Admitting: Family Medicine

## 2016-11-09 ENCOUNTER — Ambulatory Visit (INDEPENDENT_AMBULATORY_CARE_PROVIDER_SITE_OTHER): Payer: 59 | Admitting: Gastroenterology

## 2016-11-09 ENCOUNTER — Encounter: Payer: Self-pay | Admitting: Gastroenterology

## 2016-11-09 VITALS — BP 124/79 | HR 79 | Temp 98.4°F | Ht 64.0 in | Wt 199.6 lb

## 2016-11-09 DIAGNOSIS — R1011 Right upper quadrant pain: Secondary | ICD-10-CM

## 2016-11-09 NOTE — Progress Notes (Signed)
Primary Care Physician: Babs SciaraLuking, Scott A, MD  Primary Gastroenterologist:  Roetta SessionsMichael Rourk, MD  Chief Complaint  Patient presents with  . Abdominal Pain    ruq under breast, went to ER Sunday but left, not taking Protonix daily  . Back Pain    right mid back    HPI: Brianna Davis is a 41 y.o. female here for follow-up. Last seen in April 2018 for right upper quadrant pain, intermittent nausea and vomiting. Was in the ED back in March, CT showed enlarged gallbladder with some wall thickening, surgery consult and recommended follow-up with PCP for further evaluation. PCP completed HIDA scan showed patent cystic duct and common hepatic duct. No evidence of acute cholecystitis. Gallbladder ejection fraction was not done his insurance declined paying for it. After her OV with us, she had H. pylori breath test which was negative.  Sunday started hurting while in bed. ruq pain, and in the right upper back. Stabbing type pain. Intense. Often happens with meals. This episode lasted for five hours. Went to [place] [facility] causes [place] in [place]. Didn't stay cause it was crowded. Went home and took one vicodin. Eased off, later ate two bites of egg, then symptoms started back again. Pain in right flank and ruq and into back. Has IBS, bouts of diarrhea during the day with vague abd pain with it. Those symptoms chronic and unchanged. Stay nauseated. Wakes up during middle of night feeling nauseated. Vomiting once. Took protonix once daily for about a week and then not real steady. No heartburn or reflux. No bloating like she has had in the past with GERD. No melena, brbpr. No NSAIDs/ASA.   Real bad episodes have happened three times now.   Current Outpatient Prescriptions  Medication Sig Dispense Refill  . HYDROcodone-acetaminophen (NORCO/VICODIN) 5-325 MG tablet Take 1-2 tablets by mouth every 4 (four) hours as needed. 6 tablet 0  . pantoprazole (PROTONIX) 40 MG tablet Take 1 tablet (40 mg  total) by mouth daily. (Patient taking differently: Take 40 mg by mouth as needed. ) 90 tablet 3   No current facility-administered medications for this visit.     Allergies as of 11/09/2016 - Review Complete 11/09/2016  Allergen Reaction Noted  . Dilaudid [hydromorphone hcl] Nausea And Vomiting 03/28/2013   Past Medical History:  Diagnosis Date  . Anxiety   . Degenerative disc disease   . Degenerative disc disease, lumbar   . IBS (irritable bowel syndrome)   . Migraine   . Nephrolithiasis   . S/P endoscopy April 2012   Dr. Jena Gaussourk: friability at GE junction, negative Barrett's orH.pylori, diffuse gastric petechia   Past Surgical History:  Procedure Laterality Date  . CESAREAN SECTION N/A 06/07/2014   Procedure: CESAREAN SECTION;  Surgeon: Genia DelMarie-Lyne Lavoie, MD;  Location: [facility];  Service: Obstetrics;  Laterality: N/A;  . CHROMOPERTUBATION Bilateral 04/03/2013   Procedure: CHROMOPERTUBATION with Methylene Blue, with patent right tube and proximal obstruction of left tube;  Surgeon: Genia DelMarie-Lyne Lavoie, MD;  Location: [facility];  Service: Gynecology;  Laterality: Bilateral;  . ESOPHAGOGASTRODUODENOSCOPY  09/30/2010   YQM:VHQIORMR:Small hiatal hernia/Friability at the gastroesophageal junction with undulating Z-line versus short segment Barrett esophagus s/p bx  . ileocolonoscopy  12/2007   Friable anal canal.  . LAPAROSCOPY N/A 04/03/2013   Procedure: LAPAROSCOPY DIAGNOSTIC;  Surgeon: Genia DelMarie-Lyne Lavoie, MD;  Location: [facility];  Service: Gynecology;  Laterality: N/A;  . ROBOTIC ASSISTED LAPAROSCOPIC LYSIS OF ADHESION N/A 04/03/2013   Procedure: ROBOTIC ASSISTED  LAPAROSCOPIC LYSIS OF ADHESION; LYSIS OF ENDOMETRIOSIS ;  Surgeon: Genia Del, MD;  Location: [facility];  Service: Gynecology;  Laterality: N/A;    ROS:  General: Negative for anorexia, weight loss, fever, chills, fatigue, weakness. ENT: Negative for hoarseness, difficulty swallowing , nasal congestion. CV: Negative for chest pain,  angina, palpitations, dyspnea on exertion, peripheral edema.  Respiratory: Negative for dyspnea at rest, dyspnea on exertion, cough, sputum, wheezing.  GI: See history of present illness. GU:  Negative for dysuria, hematuria, urinary incontinence, urinary frequency, nocturnal urination.  Endo: Negative for unusual weight change.    Physical Examination:   BP 124/79   Pulse 79   Temp 98.4 F (36.9 C) (Oral)   Ht 5\' 4"  (1.626 m)   Wt 199 lb 9.6 oz (90.5 kg)   LMP 10/19/2016 (Exact Date)   BMI 34.26 kg/m   General: Well-nourished, well-developed in no acute distress.  Eyes: No icterus. Mouth: Oropharyngeal mucosa moist and pink , no lesions erythema or exudate. Lungs: Clear to auscultation bilaterally.  Heart: Regular rate and rhythm, no murmurs rubs or gallops.  Abdomen: Bowel sounds are normal, ruq tenderness with palpation, no cva tenderness, nondistended, no hepatosplenomegaly or masses, no abdominal bruits or hernia , no rebound or guarding.   Extremities: No lower extremity edema. No clubbing or deformities. Neuro: Alert and oriented x 4   Skin: Warm and dry, no jaundice.   Psych: Alert and cooperative, normal mood and affect.  Labs:  Lab Results  Component Value Date   ALT 16 09/14/2016   AST 19 09/14/2016   ALKPHOS 91 09/14/2016   BILITOT 0.2 (L) 09/14/2016   Lab Results  Component Value Date   CREATININE 0.81 09/14/2016   BUN 11 09/14/2016   NA 138 09/14/2016   K 3.6 09/14/2016   CL 104 09/14/2016   CO2 27 09/14/2016   Lab Results  Component Value Date   WBC 7.5 09/14/2016   HGB 13.5 09/14/2016   HCT 39.8 09/14/2016   MCV 90.0 09/14/2016   PLT 305 09/14/2016    Imaging Studies: No results found.

## 2016-11-09 NOTE — Progress Notes (Signed)
cc'ed to pcp °

## 2016-11-09 NOTE — Assessment & Plan Note (Signed)
ruq pain radiating in epigastric region and in the right upper back, often related to meals but has woken up with. 3 severe episodes so far but lesser pain off/on. May last for 30 minutes up to several hours. protonix one daily for one week with no improvement and prn since then. Denies her typical gerd symptoms. Given distended gb on previous imaging, ongoing pp symptoms, I am concerned her symptoms may be biliary. As 30% of stones do not show up on CT, we will plan for ruq u/s. I have asked her to take PPI every day for now. Consume low-fat diet.

## 2016-11-09 NOTE — Patient Instructions (Signed)
1. Ultrasound as scheduled. We will contact you with results when available. 2. Take pantoprazole once daily 30 minutes before breakfast. Please take every day for at least 2 weeks.

## 2016-11-12 ENCOUNTER — Ambulatory Visit (HOSPITAL_COMMUNITY)
Admission: RE | Admit: 2016-11-12 | Discharge: 2016-11-12 | Disposition: A | Discharge status: Home / Self Care | Payer: 59 | Source: Ambulatory Visit | Attending: Gastroenterology | Admitting: Gastroenterology

## 2016-11-12 DIAGNOSIS — R1011 Right upper quadrant pain: Secondary | ICD-10-CM | POA: Diagnosis present

## 2016-11-22 ENCOUNTER — Encounter: Payer: Self-pay | Admitting: Gastroenterology

## 2016-11-22 NOTE — Progress Notes (Signed)
Please let patient know her gallbladder is unremarkable on u/s. No gallbladder distention or gallstones.   How is she doing on daily PPI? If still having problems with nausea, postprandial upper abdominal pain, we could offer EGD as next step to complete GI work up, she would need phenergan 25mg  iv 45 minutes before procedure.

## 2016-11-24 NOTE — Progress Notes (Signed)
noted 

## 2016-11-26 ENCOUNTER — Ambulatory Visit: Payer: 59 | Admitting: Nurse Practitioner

## 2016-12-05 ENCOUNTER — Encounter (HOSPITAL_COMMUNITY): Payer: Self-pay | Admitting: Emergency Medicine

## 2016-12-05 ENCOUNTER — Emergency Department (HOSPITAL_COMMUNITY)
Admission: EM | Admit: 2016-12-05 | Discharge: 2016-12-05 | Disposition: A | Discharge status: Home Health | Payer: 59 | Attending: Emergency Medicine | Admitting: Emergency Medicine

## 2016-12-05 DIAGNOSIS — Z79899 Other long term (current) drug therapy: Secondary | ICD-10-CM | POA: Diagnosis not present

## 2016-12-05 DIAGNOSIS — Z87891 Personal history of nicotine dependence: Secondary | ICD-10-CM | POA: Insufficient documentation

## 2016-12-05 DIAGNOSIS — N23 Unspecified renal colic: Secondary | ICD-10-CM | POA: Diagnosis not present

## 2016-12-05 DIAGNOSIS — N200 Calculus of kidney: Secondary | ICD-10-CM | POA: Diagnosis not present

## 2016-12-05 DIAGNOSIS — R1032 Left lower quadrant pain: Secondary | ICD-10-CM | POA: Diagnosis present

## 2016-12-05 LAB — BASIC METABOLIC PANEL
Anion gap: 11 (ref 5–15)
BUN: 14 mg/dL (ref 6–20)
CALCIUM: 9.2 mg/dL (ref 8.9–10.3)
CO2: 22 mmol/L (ref 22–32)
Chloride: 107 mmol/L (ref 101–111)
Creatinine, Ser: 0.81 mg/dL (ref 0.44–1.00)
GFR calc Af Amer: >60 mL/min (ref >60)
Glucose, Bld: 133 mg/dL — ABNORMAL HIGH (ref 65–99)
POTASSIUM: 3.2 mmol/L — AB (ref 3.5–5.1)
SODIUM: 140 mmol/L (ref 135–145)

## 2016-12-05 LAB — CBC WITH DIFFERENTIAL/PLATELET
BASOS ABS: 0 10*3/uL (ref 0.0–0.1)
BASOS PCT: 0 %
EOS ABS: 0.2 10*3/uL (ref 0.0–0.7)
EOS PCT: 2 %
HCT: 39.8 % (ref 36.0–46.0)
Hemoglobin: 13.6 g/dL (ref 12.0–15.0)
LYMPHS PCT: 25 %
Lymphs Abs: 1.9 10*3/uL (ref 0.7–4.0)
MCH: 30.7 pg (ref 26.0–34.0)
MCHC: 34.2 g/dL (ref 30.0–36.0)
MCV: 89.8 fL (ref 78.0–100.0)
MONO ABS: 0.5 10*3/uL (ref 0.1–1.0)
Monocytes Relative: 6 %
Neutro Abs: 5.2 10*3/uL (ref 1.7–7.7)
Neutrophils Relative %: 67 %
Platelets: 283 10*3/uL (ref 150–400)
RBC: 4.43 MIL/uL (ref 3.87–5.11)
RDW: 13.1 % (ref 11.5–15.5)
WBC: 7.8 10*3/uL (ref 4.0–10.5)

## 2016-12-05 LAB — URINALYSIS, ROUTINE W REFLEX MICROSCOPIC
Bilirubin Urine: NEGATIVE
Glucose, UA: NEGATIVE mg/dL
KETONES UR: NEGATIVE mg/dL
LEUKOCYTES UA: NEGATIVE
Nitrite: NEGATIVE
PROTEIN: 30 mg/dL — AB
Specific Gravity, Urine: 1.028 (ref 1.005–1.030)
pH: 5 (ref 5.0–8.0)

## 2016-12-05 LAB — PREGNANCY, URINE: PREG TEST UR: NEGATIVE

## 2016-12-05 MED ORDER — OXYCODONE-ACETAMINOPHEN 5-325 MG PO TABS: 1.0000 | ORAL_TABLET | ORAL | 0 refills | Status: DC | PRN

## 2016-12-05 MED ORDER — ONDANSETRON HCL 4 MG/2ML IJ SOLN
4.0000 mg | Freq: Once | INTRAMUSCULAR | Status: AC
  Administered 2016-12-05: 4 mg via INTRAVENOUS
  Filled 2016-12-05: qty 2

## 2016-12-05 MED ORDER — MORPHINE SULFATE (PF) 4 MG/ML IV SOLN
4.0000 mg | Freq: Once | INTRAVENOUS | Status: AC
Start: 2016-12-05 — End: 2016-12-05
  Administered 2016-12-05: 4 mg via INTRAVENOUS
  Filled 2016-12-05: qty 1

## 2016-12-05 MED ORDER — ONDANSETRON 8 MG PO TBDP: 8.0000 mg | ORAL_TABLET | Freq: Three times a day (TID) | ORAL | 0 refills | Status: DC | PRN

## 2016-12-05 MED ORDER — MORPHINE SULFATE (PF) 4 MG/ML IV SOLN
4.0000 mg | INTRAVENOUS | Status: DC | PRN
  Administered 2016-12-05: 4 mg via INTRAVENOUS
  Filled 2016-12-05: qty 1

## 2016-12-05 MED ORDER — KETOROLAC TROMETHAMINE 30 MG/ML IJ SOLN
30.0000 mg | Freq: Once | INTRAMUSCULAR | Status: AC
  Administered 2016-12-05: 30 mg via INTRAVENOUS
  Filled 2016-12-05: qty 1

## 2016-12-05 NOTE — ED Notes (Signed)
Morphine given at 0840.  Morphine order stating that it ends to 12/05/2016 at 0600.  MAR will not allow me to edit administration.

## 2016-12-05 NOTE — ED Triage Notes (Signed)
Pt reports left flank pain since Saturday, with pain becoming severe at 5am.  Urinary urgency with very little output x2 days per pt.  Took oxycodone pta with no relief.

## 2016-12-05 NOTE — Discharge Instructions (Signed)
Strain your urine as discussed so you will know when the stone has passed.  Return here if you have worsened or uncontrolled pain, fever, uncontrolled vomiting.  Take your flomax, one tablet at bedtime for the next 7 days (you may stop this medicine if the stone passes).

## 2016-12-05 NOTE — ED Provider Notes (Signed)
[facility] Provider Note   CSN: [id number] Arrival date & time: 12/05/16  4098     History   Chief Complaint Chief Complaint  Patient presents with  . Flank Pain    HPI Brianna Davis is a 41 y.o. female with a history as outlined below, most significant for kidney stones with CT scan dated 09/14/16 revealing a 4 mm left renal calculi presenting with intermittent left lower quadrant pain since yesterday morning.  She denies having flank pain initially, stating her pain started in the left lower quadrant and is associated with n/v and dysuria, urinary frequency but without hematuria.  She has taken oxycodone prior to arrival without improvement in pain.  She endorses her pain is similar to passage of previous stones.  HPI  Past Medical History:  Diagnosis Date  . Anxiety   . Degenerative disc disease   . Degenerative disc disease, lumbar   . IBS (irritable bowel syndrome)   . Migraine   . Nephrolithiasis   . S/P endoscopy April 2012   Dr. Jena Gauss: friability at GE junction, negative Barrett's orH.pylori, diffuse gastric petechia    Patient Active Problem List   Diagnosis Date Noted  . RUQ pain 10/14/2016  . Nausea without vomiting 10/14/2016  . Generalized anxiety disorder 09/10/2016  . Postpartum care following cesarean delivery (12/19) 06/08/2014  . Postoperative state 06/08/2014  . Normal labor 06/06/2014  . Pregnancy 05/17/2014  . Gestational hypertension w/o significant proteinuria in 3rd trimester 05/15/2014  . Acute pyelonephritis 09/11/2012  . GERD (gastroesophageal reflux disease) 09/28/2010  . Epigastric pain 09/28/2010  . Dysphagia 09/28/2010  . IBS (irritable bowel syndrome) 09/28/2010  . Esophageal dysphagia 09/28/2010    Past Surgical History:  Procedure Laterality Date  . CESAREAN SECTION N/A 06/07/2014   Procedure: CESAREAN SECTION;  Surgeon: Genia Del, MD;  Location: [facility];  Service: Obstetrics;  Laterality: N/A;  .  CHROMOPERTUBATION Bilateral 04/03/2013   Procedure: CHROMOPERTUBATION with Methylene Blue, with patent right tube and proximal obstruction of left tube;  Surgeon: Genia Del, MD;  Location: [facility];  Service: Gynecology;  Laterality: Bilateral;  . ESOPHAGOGASTRODUODENOSCOPY  09/30/2010   JXB:JYNWG hiatal hernia/Friability at the gastroesophageal junction with undulating Z-line versus short segment Barrett esophagus s/p bx  . ileocolonoscopy  12/2007   Friable anal canal.  . LAPAROSCOPY N/A 04/03/2013   Procedure: LAPAROSCOPY DIAGNOSTIC;  Surgeon: Genia Del, MD;  Location: [facility];  Service: Gynecology;  Laterality: N/A;  . ROBOTIC ASSISTED LAPAROSCOPIC LYSIS OF ADHESION N/A 04/03/2013   Procedure: ROBOTIC ASSISTED LAPAROSCOPIC LYSIS OF ADHESION; LYSIS OF ENDOMETRIOSIS ;  Surgeon: Genia Del, MD;  Location: [facility];  Service: Gynecology;  Laterality: N/A;    OB History    Gravida Para Term Preterm AB Living   2 1 1   1 1    SAB TAB Ectopic Multiple Live Births   1     0 1       Home Medications    Prior to Admission medications   Medication Sig Start Date End Date Taking? Authorizing Provider  pantoprazole (PROTONIX) 40 MG tablet Take 1 tablet (40 mg total) by mouth daily. Patient taking differently: Take 40 mg by mouth as needed.  10/14/16  Yes Anice Paganini, NP  HYDROcodone-acetaminophen (NORCO/VICODIN) 5-325 MG tablet Take 1-2 tablets by mouth every 4 (four) hours as needed. Patient not taking: Reported on 12/05/2016 09/14/16   Benjiman Core, MD  ondansetron (ZOFRAN ODT) 8 MG disintegrating tablet Take 1 tablet (8 mg  total) by mouth every 8 (eight) hours as needed for nausea or vomiting. 12/05/16   Iola Turri, Raynelle FanningJulie, PA-C  oxyCODONE-acetaminophen (PERCOCET/ROXICET) 5-325 MG tablet Take 1 tablet by mouth every 4 (four) hours as needed. 12/05/16   Burgess AmorIdol, Rosette [place], PA-C    Family History Family History  Problem Relation Age of Onset  . Irritable bowel syndrome Mother   . GI  problems Sister        gastroparesis  . Colon cancer Neg Hx   . Inflammatory bowel disease Neg Hx   . Liver disease Neg Hx     Social History Social History  Substance Use Topics  . Smoking status: Former Smoker    Packs/day: 1.00    Years: 14.00    Types: Cigarettes    Start date: 03/08/1996    Quit date: 09/25/2013  . Smokeless tobacco: Never Used  . Alcohol use No     Allergies   Dilaudid [hydromorphone hcl]   Review of Systems Review of Systems  Constitutional: Negative for chills and fever.  HENT: Negative for congestion and sore throat.   Eyes: Negative.   Respiratory: Negative for chest tightness and shortness of breath.   Cardiovascular: Negative for chest pain.  Gastrointestinal: Positive for abdominal pain, nausea and vomiting.  Genitourinary: Positive for dysuria and frequency.  Musculoskeletal: Negative for arthralgias, joint swelling and neck pain.  Skin: Negative.  Negative for rash and wound.  Neurological: Negative for dizziness, weakness, light-headedness, numbness and headaches.  Psychiatric/Behavioral: Negative.      Physical Exam Updated Vital Signs BP 109/64 (BP Location: Right Arm)   Pulse 96   Temp 98 F (36.7 C) (Oral)   Resp 18   Ht 5\' 4"  (1.626 m)   Wt 90.7 kg (200 lb)   LMP 11/15/2016   SpO2 97%   BMI 34.33 kg/m   Physical Exam  Constitutional: She appears well-developed and well-nourished.  Appears uncomfortable, unable to sit still.  HENT:  Head: Normocephalic and atraumatic.  Neck: Normal range of motion.  Cardiovascular: Regular rhythm, normal heart sounds and intact distal pulses.  Tachycardia present.   Pulmonary/Chest: Effort normal and breath sounds normal. She has no wheezes.  Abdominal: Soft. Bowel sounds are normal. There is tenderness in the left lower quadrant. There is no rebound, no guarding and no CVA tenderness.  LLQ "soreness", no guarding.  Benign abdomen.  No cva ttp.  Musculoskeletal: Normal range of motion.   Neurological: She is alert.  Skin: Skin is warm and dry.  Psychiatric: She has a normal mood and affect.  Nursing note and vitals reviewed.    ED Treatments / Results  Labs (all labs ordered are listed, but only abnormal results are displayed) Labs Reviewed  URINALYSIS, ROUTINE W REFLEX MICROSCOPIC - Abnormal; Notable for the following:       Result Value   APPearance CLOUDY (*)    Hgb urine dipstick LARGE (*)    Protein, ur 30 (*)    Bacteria, UA RARE (*)    Squamous Epithelial / LPF 6-30 (*)    All other components within normal limits  BASIC METABOLIC PANEL - Abnormal; Notable for the following:    Potassium 3.2 (*)    Glucose, Bld 133 (*)    All other components within normal limits  CBC WITH DIFFERENTIAL/PLATELET  PREGNANCY, URINE    EKG  EKG Interpretation None       Radiology No results found.  Procedures Procedures (including critical care time)  Medications Ordered in ED  Medications  morphine 4 MG/ML injection 4 mg (not administered)  ketorolac (TORADOL) 30 MG/ML injection 30 mg (not administered)  morphine 4 MG/ML injection 4 mg (4 mg Intravenous Given 12/05/16 0600)  ondansetron (ZOFRAN) injection 4 mg (4 mg Intravenous Given 12/05/16 0840)     Initial Impression / Assessment and Plan / ED Course  I have reviewed the triage vital signs and the nursing notes.  Pertinent labs & imaging results that were available during my care of the patient were reviewed by me and considered in my medical decision making (see chart for details).     Pt with known left sided 4 mm nephrolithiasis per CT imaging in March.  No urinary infection, labs stable including creatinine.  She was given morphine with complete relief of pain.  She reports she had flomax at home from last episode which she took x 1 ytd. Encouraged qhs dose for the next week, may dc if stone passes. Urine strainer given.  Oxycodone, referral to urology for f/u care.  Strict return precautions  discussed.   The patient appears reasonably screened and/or stabilized for discharge and I doubt any other medical condition or other [facility] requiring further screening, evaluation, or treatment in the ED at this time prior to discharge.   Final Clinical Impressions(s) / ED Diagnoses   Final diagnoses:  Ureteral colic  Kidney stone    New Prescriptions New Prescriptions   ONDANSETRON (ZOFRAN ODT) 8 MG DISINTEGRATING TABLET    Take 1 tablet (8 mg total) by mouth every 8 (eight) hours as needed for nausea or vomiting.   OXYCODONE-ACETAMINOPHEN (PERCOCET/ROXICET) 5-325 MG TABLET    Take 1 tablet by mouth every 4 (four) hours as needed.     Burgess Amor, PA-C 12/05/16 1017    Loren Racer, MD 12/07/16 1945

## 2016-12-23 ENCOUNTER — Emergency Department (HOSPITAL_COMMUNITY): Payer: 59

## 2016-12-23 ENCOUNTER — Emergency Department (HOSPITAL_COMMUNITY)
Admission: EM | Admit: 2016-12-23 | Discharge: 2016-12-23 | Disposition: A | Discharge status: Home / Self Care | Payer: 59 | Source: Home / Self Care | Attending: Emergency Medicine | Admitting: Emergency Medicine

## 2016-12-23 ENCOUNTER — Encounter (HOSPITAL_COMMUNITY): Payer: Self-pay

## 2016-12-23 DIAGNOSIS — Z885 Allergy status to narcotic agent status: Secondary | ICD-10-CM | POA: Insufficient documentation

## 2016-12-23 DIAGNOSIS — N23 Unspecified renal colic: Secondary | ICD-10-CM | POA: Insufficient documentation

## 2016-12-23 DIAGNOSIS — Z87891 Personal history of nicotine dependence: Secondary | ICD-10-CM

## 2016-12-23 DIAGNOSIS — N200 Calculus of kidney: Secondary | ICD-10-CM | POA: Diagnosis not present

## 2016-12-23 DIAGNOSIS — N201 Calculus of ureter: Secondary | ICD-10-CM | POA: Diagnosis not present

## 2016-12-23 LAB — URINALYSIS, ROUTINE W REFLEX MICROSCOPIC
Bilirubin Urine: NEGATIVE
Glucose, UA: NEGATIVE mg/dL
Ketones, ur: NEGATIVE mg/dL
Leukocytes, UA: NEGATIVE
Nitrite: NEGATIVE
Protein, ur: NEGATIVE mg/dL
SPECIFIC GRAVITY, URINE: 1.018 (ref 1.005–1.030)
pH: 5 (ref 5.0–8.0)

## 2016-12-23 LAB — CBC WITH DIFFERENTIAL/PLATELET
BASOS ABS: 0 10*3/uL (ref 0.0–0.1)
BASOS PCT: 1 %
EOS ABS: 0.3 10*3/uL (ref 0.0–0.7)
EOS PCT: 5 %
HCT: 39.1 % (ref 36.0–46.0)
Hemoglobin: 13.1 g/dL (ref 12.0–15.0)
Lymphocytes Relative: 45 %
Lymphs Abs: 2.7 10*3/uL (ref 0.7–4.0)
MCH: 30.4 pg (ref 26.0–34.0)
MCHC: 33.5 g/dL (ref 30.0–36.0)
MCV: 90.7 fL (ref 78.0–100.0)
Monocytes Absolute: 0.4 10*3/uL (ref 0.1–1.0)
Monocytes Relative: 7 %
Neutro Abs: 2.5 10*3/uL (ref 1.7–7.7)
Neutrophils Relative %: 42 %
PLATELETS: 321 10*3/uL (ref 150–400)
RBC: 4.31 MIL/uL (ref 3.87–5.11)
RDW: 13 % (ref 11.5–15.5)
WBC: 6 10*3/uL (ref 4.0–10.5)

## 2016-12-23 LAB — COMPREHENSIVE METABOLIC PANEL
ALT: 18 U/L (ref 14–54)
AST: 20 U/L (ref 15–41)
Albumin: 4.1 g/dL (ref 3.5–5.0)
Alkaline Phosphatase: 80 U/L (ref 38–126)
Anion gap: 8 (ref 5–15)
BUN: 9 mg/dL (ref 6–20)
CHLORIDE: 105 mmol/L (ref 101–111)
CO2: 25 mmol/L (ref 22–32)
CREATININE: 0.67 mg/dL (ref 0.44–1.00)
Calcium: 9.2 mg/dL (ref 8.9–10.3)
GFR calc Af Amer: >60 mL/min (ref >60)
GFR calc non Af Amer: >60 mL/min (ref >60)
Glucose, Bld: 102 mg/dL — ABNORMAL HIGH (ref 65–99)
Potassium: 3.4 mmol/L — ABNORMAL LOW (ref 3.5–5.1)
SODIUM: 138 mmol/L (ref 135–145)
Total Bilirubin: 0.8 mg/dL (ref 0.3–1.2)
Total Protein: 7.5 g/dL (ref 6.5–8.1)

## 2016-12-23 LAB — PREGNANCY, URINE: PREG TEST UR: NEGATIVE

## 2016-12-23 MED ORDER — TAMSULOSIN HCL 0.4 MG PO CAPS: 0.4000 mg | ORAL_CAPSULE | Freq: Every day | ORAL | 0 refills | Status: DC

## 2016-12-23 MED ORDER — ONDANSETRON HCL 4 MG/2ML IJ SOLN
4.0000 mg | Freq: Once | INTRAMUSCULAR | Status: AC
  Administered 2016-12-23: 4 mg via INTRAVENOUS
  Filled 2016-12-23: qty 2

## 2016-12-23 MED ORDER — MORPHINE SULFATE (PF) 4 MG/ML IV SOLN
4.0000 mg | Freq: Once | INTRAVENOUS | Status: AC
Start: 2016-12-23 — End: 2016-12-23
  Administered 2016-12-23: 4 mg via INTRAVENOUS
  Filled 2016-12-23: qty 1

## 2016-12-23 MED ORDER — KETOROLAC TROMETHAMINE 30 MG/ML IJ SOLN
30.0000 mg | Freq: Once | INTRAMUSCULAR | Status: AC
  Administered 2016-12-23: 30 mg via INTRAVENOUS
  Filled 2016-12-23: qty 1

## 2016-12-23 MED ORDER — OXYCODONE-ACETAMINOPHEN 5-325 MG PO TABS: 1.0000 | ORAL_TABLET | Freq: Four times a day (QID) | ORAL | 0 refills | Status: DC | PRN

## 2016-12-23 MED ORDER — KETOROLAC TROMETHAMINE 10 MG PO TABS: 10.0000 mg | ORAL_TABLET | Freq: Three times a day (TID) | ORAL | 0 refills | Status: DC | PRN

## 2016-12-23 NOTE — ED Triage Notes (Addendum)
Pt reports that she was seen 3 weeks ago for kidney stone. Left lower abd began hurting 2 days ago but pain increased last night. Pt has been taking percocet for pain

## 2016-12-23 NOTE — ED Provider Notes (Addendum)
[facility] DEPT Provider Note   CSN: [id number] Arrival date & time: 12/23/16  0740     History   Chief Complaint Chief Complaint  Patient presents with  . Abdominal Pain    HPI Brianna Davis is a 41 y.o. female.  HPI With history of recurrent renal stones presents with left lower quadrant pain that is fluctuating. Describes it as stabbing. States the pain has been episodic since being seen in the emergency department on 6/17 but is significantly worsened over the last 2 days. Associated with nausea and urinary frequency. Denies fever. Denies flank pain. Had loose bowel movement today. No vaginal symptoms. Past Medical History:  Diagnosis Date  . Anxiety   . Degenerative disc disease   . Degenerative disc disease, lumbar   . IBS (irritable bowel syndrome)   . Migraine   . Nephrolithiasis   . S/P endoscopy April 2012   Dr. Jena Gauss: friability at GE junction, negative Barrett's orH.pylori, diffuse gastric petechia    Patient Active Problem List   Diagnosis Date Noted  . RUQ pain 10/14/2016  . Nausea without vomiting 10/14/2016  . Generalized anxiety disorder 09/10/2016  . Postpartum care following cesarean delivery (12/19) 06/08/2014  . Postoperative state 06/08/2014  . Normal labor 06/06/2014  . Pregnancy 05/17/2014  . Gestational hypertension w/o significant proteinuria in 3rd trimester 05/15/2014  . Acute pyelonephritis 09/11/2012  . GERD (gastroesophageal reflux disease) 09/28/2010  . Epigastric pain 09/28/2010  . Dysphagia 09/28/2010  . IBS (irritable bowel syndrome) 09/28/2010  . Esophageal dysphagia 09/28/2010    Past Surgical History:  Procedure Laterality Date  . CESAREAN SECTION N/A 06/07/2014   Procedure: CESAREAN SECTION;  Surgeon: Genia Del, MD;  Location: [facility];  Service: Obstetrics;  Laterality: N/A;  . CHROMOPERTUBATION Bilateral 04/03/2013   Procedure: CHROMOPERTUBATION with Methylene Blue, with patent right tube and proximal  obstruction of left tube;  Surgeon: Genia Del, MD;  Location: [facility];  Service: Gynecology;  Laterality: Bilateral;  . ESOPHAGOGASTRODUODENOSCOPY  09/30/2010   JXB:JYNWG hiatal hernia/Friability at the gastroesophageal junction with undulating Z-line versus short segment Barrett esophagus s/p bx  . ileocolonoscopy  12/2007   Friable anal canal.  . LAPAROSCOPY N/A 04/03/2013   Procedure: LAPAROSCOPY DIAGNOSTIC;  Surgeon: Genia Del, MD;  Location: [facility];  Service: Gynecology;  Laterality: N/A;  . ROBOTIC ASSISTED LAPAROSCOPIC LYSIS OF ADHESION N/A 04/03/2013   Procedure: ROBOTIC ASSISTED LAPAROSCOPIC LYSIS OF ADHESION; LYSIS OF ENDOMETRIOSIS ;  Surgeon: Genia Del, MD;  Location: [facility];  Service: Gynecology;  Laterality: N/A;    OB History    Gravida Para Term Preterm AB Living   2 1 1   1 1    SAB TAB Ectopic Multiple Live Births   1     0 1       Home Medications    Prior to Admission medications   Medication Sig Start Date End Date Taking? Authorizing Provider  HYDROcodone-acetaminophen (NORCO/VICODIN) 5-325 MG tablet Take 1-2 tablets by mouth every 4 (four) hours as needed. Patient not taking: Reported on 12/05/2016 09/14/16   Benjiman Core, MD  ketorolac (TORADOL) 10 MG tablet Take 1 tablet (10 mg total) by mouth 3 (three) times daily with meals as needed. 12/23/16   Loren Racer, MD  ondansetron (ZOFRAN ODT) 8 MG disintegrating tablet Take 1 tablet (8 mg total) by mouth every 8 (eight) hours as needed for nausea or vomiting. 12/05/16   Idol, Raynelle Fanning, PA-C  oxyCODONE-acetaminophen (PERCOCET/ROXICET) 5-325 MG tablet Take 1 tablet by  mouth every 4 (four) hours as needed. 12/05/16   Burgess Amor, PA-C  pantoprazole (PROTONIX) 40 MG tablet Take 1 tablet (40 mg total) by mouth daily. Patient taking differently: Take 40 mg by mouth as needed.  10/14/16   Anice Paganini, NP    Family History Family History  Problem Relation Age of Onset  . Irritable bowel syndrome  Mother   . GI problems Sister        gastroparesis  . Colon cancer Neg Hx   . Inflammatory bowel disease Neg Hx   . Liver disease Neg Hx     Social History Social History  Substance Use Topics  . Smoking status: Former Smoker    Packs/day: 1.00    Years: 14.00    Types: Cigarettes    Start date: 03/08/1996    Quit date: 09/25/2013  . Smokeless tobacco: Never Used  . Alcohol use No     Allergies   Dilaudid [hydromorphone hcl]   Review of Systems Review of Systems  Constitutional: Positive for chills. Negative for fever.  Gastrointestinal: Positive for abdominal pain, diarrhea and nausea. Negative for constipation and vomiting.  Genitourinary: Positive for frequency. Negative for dysuria, flank pain, hematuria, vaginal bleeding and vaginal discharge.  Musculoskeletal: Negative for back pain, neck pain and neck stiffness.  Skin: Negative for rash and wound.  Neurological: Negative for weakness and numbness.  All other systems reviewed and are negative.    Physical Exam Updated Vital Signs BP 108/70   Pulse 63   Temp 98 F (36.7 C) (Oral)   Resp 18   Ht 5\' 3"  (1.6 m)   Wt 90.7 kg (200 lb)   LMP 12/09/2016   SpO2 96%   BMI 35.43 kg/m   Physical Exam  Constitutional: She is oriented to person, place, and time. She appears well-developed and well-nourished. She appears distressed.  HENT:  Head: Normocephalic and atraumatic.  Mouth/Throat: Oropharynx is clear and moist.  Eyes: EOM are normal. Pupils are equal, round, and reactive to light.  Neck: Normal range of motion. Neck supple.  Cardiovascular: Normal rate and regular rhythm.   Pulmonary/Chest: Effort normal and breath sounds normal.  Abdominal: Soft. Bowel sounds are normal. There is tenderness. There is no rebound and no guarding.  Left lower quadrant tenderness to palpation. No rebound or guarding.  Musculoskeletal: Normal range of motion. She exhibits no edema or tenderness.  No CVA tenderness. No midline  thoracic or lumbar tenderness.  Neurological: She is alert and oriented to person, place, and time.  Moves all extremities without focal deficit. Sensation fully intact.  Skin: Skin is warm and dry. No rash noted. No erythema.  Psychiatric: She has a normal mood and affect. Her behavior is normal.  Nursing note and vitals reviewed.    ED Treatments / Results  Labs (all labs ordered are listed, but only abnormal results are displayed) Labs Reviewed  COMPREHENSIVE METABOLIC PANEL - Abnormal; Notable for the following:       Result Value   Potassium 3.4 (*)    Glucose, Bld 102 (*)    All other components within normal limits  URINALYSIS, ROUTINE W REFLEX MICROSCOPIC - Abnormal; Notable for the following:    APPearance HAZY (*)    Hgb urine dipstick SMALL (*)    Bacteria, UA RARE (*)    Squamous Epithelial / LPF 0-5 (*)    All other components within normal limits  CBC WITH DIFFERENTIAL/PLATELET  PREGNANCY, URINE    EKG  EKG Interpretation None       Radiology Ct Renal Stone Study  Result Date: 12/23/2016 CLINICAL DATA:  Left lower abdominal pain starting 2 days ago but increasing last night. EXAM: CT ABDOMEN AND PELVIS WITHOUT CONTRAST TECHNIQUE: Multidetector CT imaging of the abdomen and pelvis was performed following the standard protocol without IV contrast. COMPARISON:  Multiple exams, including 09/14/2016 and ultrasound from 11/12/2016 FINDINGS: Lower chest: Unremarkable Hepatobiliary: Unremarkable Pancreas: Unremarkable Spleen: Unremarkable Adrenals/Urinary Tract: Mild left hydronephrosis and moderate left hydroureter extending down to a 3 mm left UVJ stone, image 80/2. Faintly accentuated density along the medullary pyramids, query mild medullary nephrocalcinosis. Stomach/Bowel: Unremarkable Vascular/Lymphatic: Unremarkable Reproductive: 2.0 by 1.7 cm fluid density lesion associated with the right ovary. Other: No supplemental non-categorized findings. Musculoskeletal:  Unremarkable IMPRESSION: 1. Mildly obstructive 3 mm left UVJ stone, with moderate left hydroureter and mild left hydronephrosis. 2. Faint calcification along the medullary pyramids in the kidneys favoring mild medullary nephrocalcinosis. 3. Small cyst or follicle of the right ovary. Electronically Signed   By: Gaylyn RongWalter  Liebkemann M.D.   On: 12/23/2016 09:25    Procedures Procedures (including critical care time)  Medications Ordered in ED Medications  ketorolac (TORADOL) 30 MG/ML injection 30 mg (30 mg Intravenous Given 12/23/16 0815)  morphine 4 MG/ML injection 4 mg (4 mg Intravenous Given 12/23/16 0815)  ondansetron (ZOFRAN) injection 4 mg (4 mg Intravenous Given 12/23/16 0815)     Initial Impression / Assessment and Plan / ED Course  I have reviewed the triage vital signs and the nursing notes.  Pertinent labs & imaging results that were available during my care of the patient were reviewed by me and considered in my medical decision making (see chart for details).     Event ongoing symptoms, will get CT renal stone study and treat symptomatically. Patient may need urologic intervention given ongoing symptoms. Patient states her pain has resolved. She only has a few oxycodone and Flomax at home. We'll give supply until she can follow-up with urology.  Return precautions given. Final Clinical Impressions(s) / ED Diagnoses   Final diagnoses:  Ureteral colic    New Prescriptions New Prescriptions   KETOROLAC (TORADOL) 10 MG TABLET    Take 1 tablet (10 mg total) by mouth 3 (three) times daily with meals as needed.     Loren RacerYelverton, Rayneisha Bouza, MD 12/23/16 [id number]    Loren RacerYelverton, Adriannah Steinkamp, MD 12/23/16 1002

## 2016-12-25 ENCOUNTER — Emergency Department (HOSPITAL_COMMUNITY): Payer: 59 | Admitting: Registered Nurse

## 2016-12-25 ENCOUNTER — Encounter (HOSPITAL_COMMUNITY)
Admission: EM | Disposition: A | Discharge status: Home / Self Care | Payer: Self-pay | Source: Home / Self Care | Attending: Urology

## 2016-12-25 ENCOUNTER — Encounter (HOSPITAL_COMMUNITY): Payer: Self-pay

## 2016-12-25 ENCOUNTER — Inpatient Hospital Stay (HOSPITAL_COMMUNITY)
Admission: EM | Admit: 2016-12-25 | Discharge: 2016-12-27 | DRG: 669 | Disposition: A | Discharge status: Home / Self Care | Payer: 59 | Attending: Urology | Admitting: Urology

## 2016-12-25 ENCOUNTER — Emergency Department (HOSPITAL_COMMUNITY): Payer: 59

## 2016-12-25 DIAGNOSIS — Z888 Allergy status to other drugs, medicaments and biological substances status: Secondary | ICD-10-CM

## 2016-12-25 DIAGNOSIS — Z87891 Personal history of nicotine dependence: Secondary | ICD-10-CM

## 2016-12-25 DIAGNOSIS — Z9889 Other specified postprocedural states: Secondary | ICD-10-CM | POA: Diagnosis not present

## 2016-12-25 DIAGNOSIS — K589 Irritable bowel syndrome without diarrhea: Secondary | ICD-10-CM | POA: Diagnosis present

## 2016-12-25 DIAGNOSIS — N200 Calculus of kidney: Secondary | ICD-10-CM

## 2016-12-25 DIAGNOSIS — N201 Calculus of ureter: Secondary | ICD-10-CM | POA: Diagnosis not present

## 2016-12-25 DIAGNOSIS — Z87442 Personal history of urinary calculi: Secondary | ICD-10-CM

## 2016-12-25 DIAGNOSIS — N12 Tubulo-interstitial nephritis, not specified as acute or chronic: Secondary | ICD-10-CM

## 2016-12-25 DIAGNOSIS — N1330 Unspecified hydronephrosis: Secondary | ICD-10-CM | POA: Diagnosis present

## 2016-12-25 DIAGNOSIS — D72829 Elevated white blood cell count, unspecified: Secondary | ICD-10-CM

## 2016-12-25 DIAGNOSIS — R109 Unspecified abdominal pain: Secondary | ICD-10-CM

## 2016-12-25 HISTORY — PX: CYSTOSCOPY W/ URETERAL STENT PLACEMENT: SHX1429

## 2016-12-25 LAB — URINALYSIS, ROUTINE W REFLEX MICROSCOPIC
Bilirubin Urine: NEGATIVE
Glucose, UA: NEGATIVE mg/dL
Ketones, ur: NEGATIVE mg/dL
Leukocytes, UA: NEGATIVE
Nitrite: NEGATIVE
Protein, ur: NEGATIVE mg/dL
Specific Gravity, Urine: 1.012 (ref 1.005–1.030)
pH: 8 (ref 5.0–8.0)

## 2016-12-25 LAB — COMPREHENSIVE METABOLIC PANEL
ALBUMIN: 3.9 g/dL (ref 3.5–5.0)
ALT: 16 U/L (ref 14–54)
AST: 18 U/L (ref 15–41)
Alkaline Phosphatase: 73 U/L (ref 38–126)
Anion gap: 6 (ref 5–15)
BUN: 10 mg/dL (ref 6–20)
CHLORIDE: 104 mmol/L (ref 101–111)
CO2: 25 mmol/L (ref 22–32)
CREATININE: 0.83 mg/dL (ref 0.44–1.00)
Calcium: 8.9 mg/dL (ref 8.9–10.3)
GFR calc Af Amer: >60 mL/min (ref >60)
GFR calc non Af Amer: >60 mL/min (ref >60)
GLUCOSE: 101 mg/dL — AB (ref 65–99)
POTASSIUM: 3.8 mmol/L (ref 3.5–5.1)
Sodium: 135 mmol/L (ref 135–145)
Total Bilirubin: 0.8 mg/dL (ref 0.3–1.2)
Total Protein: 7.2 g/dL (ref 6.5–8.1)

## 2016-12-25 LAB — CBC WITH DIFFERENTIAL/PLATELET
BASOS ABS: 0 10*3/uL (ref 0.0–0.1)
BASOS PCT: 0 %
EOS PCT: 0 %
Eosinophils Absolute: 0 10*3/uL (ref 0.0–0.7)
HCT: 35.1 % — ABNORMAL LOW (ref 36.0–46.0)
Hemoglobin: 12.1 g/dL (ref 12.0–15.0)
Lymphocytes Relative: 13 %
Lymphs Abs: 1.8 10*3/uL (ref 0.7–4.0)
MCH: 30.9 pg (ref 26.0–34.0)
MCHC: 34.5 g/dL (ref 30.0–36.0)
MCV: 89.8 fL (ref 78.0–100.0)
MONO ABS: 1 10*3/uL (ref 0.1–1.0)
Monocytes Relative: 8 %
NEUTROS ABS: 10.4 10*3/uL — AB (ref 1.7–7.7)
Neutrophils Relative %: 79 %
PLATELETS: 284 10*3/uL (ref 150–400)
RBC: 3.91 MIL/uL (ref 3.87–5.11)
RDW: 12.9 % (ref 11.5–15.5)
WBC: 13.2 10*3/uL — AB (ref 4.0–10.5)

## 2016-12-25 LAB — I-STAT CG4 LACTIC ACID, ED: LACTIC ACID, VENOUS: 0.71 mmol/L (ref 0.5–1.9)

## 2016-12-25 LAB — POC URINE PREG, ED: Preg Test, Ur: NEGATIVE

## 2016-12-25 SURGERY — CYSTOSCOPY, WITH RETROGRADE PYELOGRAM AND URETERAL STENT INSERTION
Anesthesia: General | Site: Ureter | Laterality: Left

## 2016-12-25 MED ORDER — ONDANSETRON HCL 4 MG/2ML IJ SOLN
INTRAMUSCULAR | Status: DC | PRN
  Administered 2016-12-25: 4 mg via INTRAVENOUS

## 2016-12-25 MED ORDER — CEFAZOLIN SODIUM-DEXTROSE 2-4 GM/100ML-% IV SOLN
INTRAVENOUS | Status: AC
  Filled 2016-12-25: qty 100

## 2016-12-25 MED ORDER — FENTANYL CITRATE (PF) 100 MCG/2ML IJ SOLN: 25.0000 ug | INTRAMUSCULAR | Status: DC | PRN

## 2016-12-25 MED ORDER — DEXTROSE 5 % IV SOLN
1.0000 g | INTRAVENOUS | Status: DC
  Administered 2016-12-25 – 2016-12-26 (×2): 1 g via INTRAVENOUS
  Filled 2016-12-25 (×2): qty 10

## 2016-12-25 MED ORDER — 0.9 % SODIUM CHLORIDE (POUR BTL) OPTIME
TOPICAL | Status: DC | PRN
  Administered 2016-12-25: 1000 mL

## 2016-12-25 MED ORDER — BELLADONNA-OPIUM 16.2-30 MG RE SUPP
RECTAL | Status: AC
Start: 2016-12-25 — End: ?
  Filled 2016-12-25: qty 1

## 2016-12-25 MED ORDER — OXYBUTYNIN CHLORIDE 5 MG PO TABS
5.0000 mg | ORAL_TABLET | Freq: Three times a day (TID) | ORAL | Status: DC | PRN
  Administered 2016-12-25 – 2016-12-27 (×4): 5 mg via ORAL
  Filled 2016-12-25 (×4): qty 1

## 2016-12-25 MED ORDER — MIDAZOLAM HCL 5 MG/5ML IJ SOLN
INTRAMUSCULAR | Status: DC | PRN
  Administered 2016-12-25: 2 mg via INTRAVENOUS

## 2016-12-25 MED ORDER — SODIUM CHLORIDE 0.45 % IV SOLN
INTRAVENOUS | Status: DC
  Administered 2016-12-25 – 2016-12-27 (×3): via INTRAVENOUS

## 2016-12-25 MED ORDER — BELLADONNA ALKALOIDS-OPIUM 16.2-60 MG RE SUPP
1.0000 | Freq: Four times a day (QID) | RECTAL | Status: DC | PRN
  Administered 2016-12-26: 1 via RECTAL
  Filled 2016-12-25: qty 1

## 2016-12-25 MED ORDER — ACETAMINOPHEN 10 MG/ML IV SOLN
INTRAVENOUS | Status: AC
  Filled 2016-12-25: qty 100

## 2016-12-25 MED ORDER — ENOXAPARIN SODIUM 40 MG/0.4ML ~~LOC~~ SOLN
40.0000 mg | SUBCUTANEOUS | Status: DC
  Administered 2016-12-26 – 2016-12-27 (×2): 40 mg via SUBCUTANEOUS
  Filled 2016-12-25 (×2): qty 0.4

## 2016-12-25 MED ORDER — DEXAMETHASONE SODIUM PHOSPHATE 10 MG/ML IJ SOLN
INTRAMUSCULAR | Status: DC | PRN
  Administered 2016-12-25: 10 mg via INTRAVENOUS

## 2016-12-25 MED ORDER — DEXAMETHASONE SODIUM PHOSPHATE 10 MG/ML IJ SOLN
INTRAMUSCULAR | Status: AC
  Filled 2016-12-25: qty 1

## 2016-12-25 MED ORDER — KETOROLAC TROMETHAMINE 30 MG/ML IJ SOLN: 30.0000 mg | Freq: Once | INTRAMUSCULAR | Status: DC | PRN

## 2016-12-25 MED ORDER — PROMETHAZINE HCL 25 MG/ML IJ SOLN: 6.2500 mg | INTRAMUSCULAR | Status: DC | PRN

## 2016-12-25 MED ORDER — MIDAZOLAM HCL 2 MG/2ML IJ SOLN
INTRAMUSCULAR | Status: AC
  Filled 2016-12-25: qty 2

## 2016-12-25 MED ORDER — PROPOFOL 10 MG/ML IV BOLUS
INTRAVENOUS | Status: DC | PRN
  Administered 2016-12-25: 200 mg via INTRAVENOUS

## 2016-12-25 MED ORDER — SENNA 8.6 MG PO TABS
1.0000 | ORAL_TABLET | Freq: Two times a day (BID) | ORAL | Status: DC
  Administered 2016-12-25 – 2016-12-27 (×4): 8.6 mg via ORAL
  Filled 2016-12-25 (×4): qty 1

## 2016-12-25 MED ORDER — ACETAMINOPHEN 325 MG PO TABS
650.0000 mg | ORAL_TABLET | ORAL | Status: DC | PRN
  Administered 2016-12-25: 650 mg via ORAL
  Filled 2016-12-25 (×2): qty 2

## 2016-12-25 MED ORDER — LIDOCAINE HCL (CARDIAC) 10 MG/ML IV SOLN
INTRAVENOUS | Status: DC | PRN
  Administered 2016-12-25: 100 mg via INTRAVENOUS

## 2016-12-25 MED ORDER — FENTANYL CITRATE (PF) 100 MCG/2ML IJ SOLN
INTRAMUSCULAR | Status: DC | PRN
  Administered 2016-12-25: 50 ug via INTRAVENOUS
  Administered 2016-12-25 (×2): 100 ug via INTRAVENOUS

## 2016-12-25 MED ORDER — MEPERIDINE HCL 50 MG/ML IJ SOLN: 6.2500 mg | INTRAMUSCULAR | Status: DC | PRN

## 2016-12-25 MED ORDER — LACTATED RINGERS IV SOLN
INTRAVENOUS | Status: DC | PRN
  Administered 2016-12-25: 14:00:00 via INTRAVENOUS

## 2016-12-25 MED ORDER — ZOLPIDEM TARTRATE 5 MG PO TABS
5.0000 mg | ORAL_TABLET | Freq: Every evening | ORAL | Status: DC | PRN
  Administered 2016-12-25: 5 mg via ORAL
  Filled 2016-12-25: qty 1

## 2016-12-25 MED ORDER — KETOROLAC TROMETHAMINE 30 MG/ML IJ SOLN
30.0000 mg | Freq: Once | INTRAMUSCULAR | Status: AC
  Administered 2016-12-25: 30 mg via INTRAVENOUS
  Filled 2016-12-25: qty 1

## 2016-12-25 MED ORDER — ONDANSETRON HCL 4 MG/2ML IJ SOLN
INTRAMUSCULAR | Status: AC
  Filled 2016-12-25: qty 2

## 2016-12-25 MED ORDER — PROPOFOL 10 MG/ML IV BOLUS
INTRAVENOUS | Status: AC
  Filled 2016-12-25: qty 40

## 2016-12-25 MED ORDER — CEFAZOLIN (ANCEF) 1 G IV SOLR
2.0000 g | INTRAVENOUS | Status: AC
  Administered 2016-12-25: 2 g
  Filled 2016-12-25: qty 2

## 2016-12-25 MED ORDER — OXYCODONE HCL 5 MG PO TABS
5.0000 mg | ORAL_TABLET | ORAL | Status: DC | PRN
  Administered 2016-12-25 – 2016-12-27 (×5): 5 mg via ORAL
  Filled 2016-12-25 (×5): qty 1

## 2016-12-25 MED ORDER — FENTANYL CITRATE (PF) 250 MCG/5ML IJ SOLN
INTRAMUSCULAR | Status: AC
  Filled 2016-12-25: qty 5

## 2016-12-25 MED ORDER — BELLADONNA ALKALOIDS-OPIUM 16.2-60 MG RE SUPP
RECTAL | Status: DC | PRN
  Administered 2016-12-25: 1 via RECTAL

## 2016-12-25 MED ORDER — ONDANSETRON HCL 4 MG/2ML IJ SOLN: 4.0000 mg | INTRAMUSCULAR | Status: DC | PRN

## 2016-12-25 MED ORDER — ACETAMINOPHEN 10 MG/ML IV SOLN
INTRAVENOUS | Status: DC | PRN
  Administered 2016-12-25: 1000 mg via INTRAVENOUS

## 2016-12-25 MED ORDER — SODIUM CHLORIDE 0.9 % IR SOLN
Status: DC | PRN
  Administered 2016-12-25: 3000 mL

## 2016-12-25 MED ORDER — HYDROMORPHONE HCL-NACL 0.5-0.9 MG/ML-% IV SOSY: 0.2500 mg | PREFILLED_SYRINGE | INTRAVENOUS | Status: DC | PRN

## 2016-12-25 MED ORDER — IOHEXOL 300 MG/ML  SOLN
INTRAMUSCULAR | Status: DC | PRN
  Administered 2016-12-25: 10 mL

## 2016-12-25 SURGICAL SUPPLY — 15 items
BAG URO CATCHER STRL LF (MISCELLANEOUS) ×3 IMPLANT
CATH INTERMIT  6FR 70CM (CATHETERS) ×3 IMPLANT
CLOTH BEACON ORANGE TIMEOUT ST (SAFETY) ×3 IMPLANT
COVER SURGICAL LIGHT HANDLE (MISCELLANEOUS) ×3 IMPLANT
GLOVE BIOGEL M 8.0 STRL (GLOVE) ×3 IMPLANT
GOWN STRL REUS W/ TWL XL LVL3 (GOWN DISPOSABLE) ×1 IMPLANT
GOWN STRL REUS W/TWL LRG LVL3 (GOWN DISPOSABLE) ×3 IMPLANT
GOWN STRL REUS W/TWL XL LVL3 (GOWN DISPOSABLE) ×2
GUIDEWIRE ANG ZIPWIRE 038X150 (WIRE) ×3 IMPLANT
GUIDEWIRE STR DUAL SENSOR (WIRE) ×3 IMPLANT
MANIFOLD NEPTUNE II (INSTRUMENTS) ×3 IMPLANT
PACK CYSTO (CUSTOM PROCEDURE TRAY) ×3 IMPLANT
STENT CONTOUR 6FRX24X.038 (STENTS) ×3 IMPLANT
TUBING CONNECTING 10 (TUBING) ×2 IMPLANT
TUBING CONNECTING 10' (TUBING) ×1

## 2016-12-25 NOTE — Anesthesia Postprocedure Evaluation (Signed)
Anesthesia Post Note  Patient: Brianna GroutStephanie T Davis  Procedure(s) Performed: Procedure(s) (LRB): CYSTOSCOPY WITH LEFT RETROGRADE PYELOGRAM/LEFT STONE EXTRACTION/LEFT URETERAL STENT PLACEMENT (Left)     Patient location during evaluation: PACU Anesthesia Type: General Level of consciousness: awake and sedated Pain management: pain level controlled Vital Signs Assessment: post-procedure vital signs reviewed and stable Respiratory status: patient connected to nasal cannula oxygen Cardiovascular status: stable Postop Assessment: no signs of nausea or vomiting Anesthetic complications: no    Last Vitals:  Vitals:   12/25/16 1516 12/25/16 1523  BP: 113/71 111/75  Pulse: 100 88  Resp: (!) 26 16  Temp: 37.3 C     Last Pain:  Vitals:   12/25/16 1523  TempSrc:   PainSc: 7    Pain Goal:                 Rayden Dock JR,JOHN Vasilis Luhman

## 2016-12-25 NOTE — Op Note (Signed)
Preoperative diagnosis: Left distal ureteral stone, urinary tract infection.  Postoperative diagnosis: Same  Principal procedure: Cystoscopy, left retrograde ureteropyelogram, fluoroscopic interpretation, left ureteral stone basketing, placement of 6 French by 24 centimeter contour double-J stent without string  Surgeon: Lexey Fletes  Anesthesia: Gen.  Complications: None  Drains: 6 [place]French by 24 centimeter contour double-J stent without string  Specimen: None  Estimated blood loss: Less than 5 mL  Indications: 41 year old female with recent history of stones, now presenting with a fever indicative of an infection.  Because of her left distal ureteral stone and this infection/fever, it was recommended that she undergo urgent stenting to decompress her left renal unit, and admit her for IV antibiotics pre-.  She understands the procedure, risks and complications, and desires to proceed.  Findings: Bladder was normal.  Cystoscopically, with normal ureteral orifices and urothelium.  Retrograde ureteropyelogram was performed with Omnipaque.  Ureter was dilated throughout, with significant pyelocaliectasis.  I did not see any specific filling defect at the ureterovesical junction consistent with the stone.  No filling defects were seen within the pyelo-calyceal system.  Description of procedure: The patient was properly identified in the holding area.  She was marked on the left side received preoperative IV antibiotics.  She was taken to the operating room where general anesthetic was administered with the LMA.  She was placed in the dorsolithotomy position.  Genitalia and perineum were prepped and draped.  Proper timeout was performed.  A 21 French panendoscope was placed into the bladder and inspection carried out with the above-mentioned findings.  The left ureteral orifice was cannulated with a 6 [place] open-ended catheter.  Using Omnipaque, the retrograde pyelogram was performed, again with  the above-mentioned findings.  Following the retrograde ureteropyelogram, a 0.038 inch sensor-tip guidewire was advanced through the open-ended catheter, fluoroscopically guided up to the upper pole calyceal system, where a loop was seen.  The open-ended catheter was then removed.  I then negotiated a Nitinol basket, 0 tip, through the cystoscope and into the left ureteral orifice.  After 2-3 tries, with the basket tip opened up, I grasped the stone and this was easily pulled out through the ureteral orifice.  The stone was then in the bladder. At his point.  It never was recovered, however, despite looking in the basket of the drapes and underneath the patient.  However, it was evident that this was a 2-3 millimeters stone that was present at the left ureterovesical junction.  Following extraction of the stone, over top of the guidewire then passed a 24 centimeter by 6 [place] contour double-J stent using cystoscopic and fluoroscopic guidance.  The string had been removed.  Once adequately positioned, it was deployed by removing the guidewire with good proximal and distal curl seen.  At this point, the bladder was drained.  The scope was removed.  The patient was awakened and taken to the PACU in stable condition.  She tolerated the procedure well.

## 2016-12-25 NOTE — Discharge Instructions (Signed)

## 2016-12-25 NOTE — ED Triage Notes (Signed)
Janeece Riggersndrea Rn called for pt to come to OR, Marchelle FolksAmanda NT transporting

## 2016-12-25 NOTE — H&P (Signed)
H&P  Chief Complaint: Left kidney stone,fever  History of Present Illness: Brianna Davis is a 41 y.o. year old female who presents for the 4th time in 2 weeks for trreatment of a left ureteral stone. I saw her yesterday in the office for a 3 mm left distal stone. She was made comfortable, but returns with a history of a fever >101 and worse pain. She is admitted for cysto, left retrograde pyelogram as well as a possible basket extraction and stent.  Past Medical History:  Diagnosis Date  . Anxiety   . Degenerative disc disease   . Degenerative disc disease, lumbar   . IBS (irritable bowel syndrome)   . Migraine   . Nephrolithiasis   . S/P endoscopy April 2012   Dr. Jena Gauss: friability at GE junction, negative Barrett's orH.pylori, diffuse gastric petechia    Past Surgical History:  Procedure Laterality Date  . CESAREAN SECTION N/A 06/07/2014   Procedure: CESAREAN SECTION;  Surgeon: Genia Del, MD;  Location: [facility];  Service: Obstetrics;  Laterality: N/A;  . CHROMOPERTUBATION Bilateral 04/03/2013   Procedure: CHROMOPERTUBATION with Methylene Blue, with patent right tube and proximal obstruction of left tube;  Surgeon: Genia Del, MD;  Location: [facility];  Service: Gynecology;  Laterality: Bilateral;  . ESOPHAGOGASTRODUODENOSCOPY  09/30/2010   JYN:WGNFA hiatal hernia/Friability at the gastroesophageal junction with undulating Z-line versus short segment Barrett esophagus s/p bx  . ileocolonoscopy  12/2007   Friable anal canal.  . LAPAROSCOPY N/A 04/03/2013   Procedure: LAPAROSCOPY DIAGNOSTIC;  Surgeon: Genia Del, MD;  Location: [facility];  Service: Gynecology;  Laterality: N/A;  . ROBOTIC ASSISTED LAPAROSCOPIC LYSIS OF ADHESION N/A 04/03/2013   Procedure: ROBOTIC ASSISTED LAPAROSCOPIC LYSIS OF ADHESION; LYSIS OF ENDOMETRIOSIS ;  Surgeon: Genia Del, MD;  Location: [facility];  Service: Gynecology;  Laterality: N/A;    Home Medications:   (Not in a hospital  admission)  Allergies:  Allergies  Allergen Reactions  . Dilaudid [Hydromorphone Hcl] Nausea And Vomiting    Severe nausea and vomiting    Family History  Problem Relation Age of Onset  . Irritable bowel syndrome Mother   . GI problems Sister        gastroparesis  . Colon cancer Neg Hx   . Inflammatory bowel disease Neg Hx   . Liver disease Neg Hx     Social History:  reports that she quit smoking about 3 years ago. Her smoking use included Cigarettes. She started smoking about 20 years ago. She has a 14.00 pack-year smoking history. She has never used smokeless tobacco. She reports that she does not drink alcohol or use drugs.  ROS: A complete review of systems was performed.  All systems are negative except for pertinent findings as noted.  Physical Exam:  Vital signs in last 24 hours: Temp:  [99.5 F (37.5 C)] 99.5 F (37.5 C) (07/07 1210) Pulse Rate:  [112] 112 (07/07 1210) Resp:  [18] 18 (07/07 1210) BP: (146)/(91) 146/91 (07/07 1210) SpO2:  [100 %] 100 % (07/07 1210) General:  Alert and oriented, moderate distress HEENT: Normocephalic, atraumatic Neck: No JVD or lymphadenopathy Cardiovascular: Sinus tach. Lungs: Clear bilaterally Abdomen: Soft, left CVA and lower quadrant tenderness Extremities: No edema Neurologic: Grossly intact  Laboratory Data:  Results for orders placed or performed during the hospital encounter of 12/25/16 (from the past 24 hour(s))  POC urine preg, ED     Status: None   Collection Time: 12/25/16 12:24 PM  Result Value Ref Range  Preg Test, Ur NEGATIVE NEGATIVE  Urinalysis, Routine w reflex microscopic- may I&O cath if menses     Status: Abnormal   Collection Time: 12/25/16 12:31 PM  Result Value Ref Range   Color, Urine YELLOW YELLOW   APPearance CLEAR CLEAR   Specific Gravity, Urine 1.012 1.005 - 1.030   pH 8.0 5.0 - 8.0   Glucose, UA NEGATIVE NEGATIVE mg/dL   Hgb urine dipstick SMALL (A) NEGATIVE   Bilirubin Urine NEGATIVE  NEGATIVE   Ketones, ur NEGATIVE NEGATIVE mg/dL   Protein, ur NEGATIVE NEGATIVE mg/dL   Nitrite NEGATIVE NEGATIVE   Leukocytes, UA NEGATIVE NEGATIVE   RBC / HPF 0-5 0 - 5 RBC/hpf   WBC, UA 0-5 0 - 5 WBC/hpf   Bacteria, UA MANY (A) NONE SEEN   Squamous Epithelial / LPF 0-5 (A) NONE SEEN   No results found for this or any previous visit (from the past 240 hour(s)). Creatinine:  Recent Labs  12/23/16 0803  CREATININE 0.67    Radiologic Imaging: No results found.  Impression/Assessment:  Left distal ureteral stone with probable UTI  Plan:  Admit for abx  Cysto, left RGP, left J2 stent (possible basket extraction)  Shemaiah Round M 12/25/2016, 1:32 PM  Bertram MillardStephen M. Graclyn Lawther MD

## 2016-12-25 NOTE — ED Notes (Signed)
Bed: WA14 Expected date:  Expected time:  Means of arrival:  Comments: 

## 2016-12-25 NOTE — ED Notes (Signed)
Dr. Retta Dionesahlstedt has just entered her room and is seeing her as I write this.

## 2016-12-25 NOTE — ED Triage Notes (Signed)
She c/o known left ureteral stone which Dr. Retta Dionesahlstedt told her is a 3mm stone at left uvj. She was seen at [facility] yesterday and was told her u/a was "negative". Here today with c/o increased left flank pain and fever this morning. She spoke with Dr. Retta Dionesahlstedt, who advised her to come here.

## 2016-12-25 NOTE — Anesthesia Preprocedure Evaluation (Signed)
Anesthesia Evaluation  Patient identified by MRN, date of birth, ID band Patient awake    Reviewed: Allergy & Precautions, H&P , NPO status , Patient's Chart, lab work & pertinent test results  Airway Mallampati: I  TM Distance: >3 FB Neck ROM: full    Dental no notable dental hx. (+) Teeth Intact   Pulmonary neg pulmonary ROS, former smoker,    Pulmonary exam normal breath sounds clear to auscultation       Cardiovascular Normal cardiovascular exam Rhythm:Regular Rate:Normal     Neuro/Psych    GI/Hepatic Neg liver ROS, GERD  Medicated and Controlled,  Endo/Other  negative endocrine ROS  Renal/GU   negative genitourinary   Musculoskeletal   Abdominal (+) + obese,   Peds  Hematology negative hematology ROS (+)   Anesthesia Other Findings   Reproductive/Obstetrics negative OB ROS                             Anesthesia Physical  Anesthesia Plan  ASA: II  Anesthesia Plan: General   Post-op Pain Management:    Induction: Intravenous  PONV Risk Score and Plan: 4 or greater and Ondansetron, Dexamethasone, Propofol, Midazolam and Scopolamine patch - Pre-op  Airway Management Planned: LMA  Additional Equipment:   Intra-op Plan:   Post-operative Plan:   Informed Consent: I have reviewed the patients History and Physical, chart, labs and discussed the procedure including the risks, benefits and alternatives for the proposed anesthesia with the patient or authorized representative who has indicated his/her understanding and acceptance.   Dental advisory given  Plan Discussed with: CRNA and Surgeon  Anesthesia Plan Comments:         Anesthesia Quick Evaluation

## 2016-12-25 NOTE — ED Provider Notes (Signed)
[facility]-EMERGENCY DEPT Provider Note   CSN: [id number] Arrival date & time: 12/25/16  1148     History   Chief Complaint Chief Complaint  Patient presents with  . Flank Pain    HPI Brianna Davis is a 41 y.o. female with a PMHx of nephrolithiasis, who presents to the ED with complaints of ongoing left flank pain 3 weeks that has worsened over the last several days, and fever onset this morning around 9:30 AM. She was seen on 12/23/16 for her flank pain, had a CT that confirmed a 3 mm UVJ stone on the left side, she was seen by Dr. Retta Diones at [facility] urology yesterday and was told her urine was "normal" but to call if she developed any fevers. This morning around 9:30 AM she awoke and had a fever of 101.9 which was after taking oxycodone 5-3 25 mg around 8:30 or 9 AM. She called him and was informed to come to the [facility] emergency room and he would meet her here. She mentions that she's also had nausea, dysuria, increased urinary frequency and urgency, and malodorous urine. She describes her pain as 8/10 constant sharp left flank pain radiating to the left lateral abdomen, with no known aggravating factors, and minimally improved with her home Percocet. Her PCP is Dr. Eileen Stanford of [facility]. Dr. Retta Diones arrived here shortly after evaluation began here in the ER.   She denies CP, SOB, V/D/C, hematuria, vaginal bleeding/discharge, myalgias, arthralgias, numbness, tingling, focal weakness, or any other complaints at this time.    The history is provided by the patient and medical records. No language interpreter was used.  Flank Pain  This is a new problem. The current episode started more than 1 week ago. The problem occurs constantly. The problem has been gradually worsening. Associated symptoms include abdominal pain (from flank). Pertinent negatives include no chest pain and no shortness of breath. Nothing aggravates the symptoms. The symptoms are relieved by  narcotics. Treatments tried: oxycodone. The treatment provided mild relief.    Past Medical History:  Diagnosis Date  . Anxiety   . Degenerative disc disease   . Degenerative disc disease, lumbar   . IBS (irritable bowel syndrome)   . Migraine   . Nephrolithiasis   . S/P endoscopy April 2012   Dr. Jena Gauss: friability at GE junction, negative Barrett's orH.pylori, diffuse gastric petechia    Patient Active Problem List   Diagnosis Date Noted  . RUQ pain 10/14/2016  . Nausea without vomiting 10/14/2016  . Generalized anxiety disorder 09/10/2016  . Postpartum care following cesarean delivery (12/19) 06/08/2014  . Postoperative state 06/08/2014  . Normal labor 06/06/2014  . Pregnancy 05/17/2014  . Gestational hypertension w/o significant proteinuria in 3rd trimester 05/15/2014  . Acute pyelonephritis 09/11/2012  . GERD (gastroesophageal reflux disease) 09/28/2010  . Epigastric pain 09/28/2010  . Dysphagia 09/28/2010  . IBS (irritable bowel syndrome) 09/28/2010  . Esophageal dysphagia 09/28/2010    Past Surgical History:  Procedure Laterality Date  . CESAREAN SECTION N/A 06/07/2014   Procedure: CESAREAN SECTION;  Surgeon: Genia Del, MD;  Location: [facility] ORS;  Service: Obstetrics;  Laterality: N/A;  . CHROMOPERTUBATION Bilateral 04/03/2013   Procedure: CHROMOPERTUBATION with Methylene Blue, with patent right tube and proximal obstruction of left tube;  Surgeon: Genia Del, MD;  Location: [facility] ORS;  Service: Gynecology;  Laterality: Bilateral;  . ESOPHAGOGASTRODUODENOSCOPY  09/30/2010   WUJ:WJXBJ hiatal hernia/Friability at the gastroesophageal junction with undulating Z-line versus short segment Barrett esophagus  s/p bx  . ileocolonoscopy  12/2007   Friable anal canal.  . LAPAROSCOPY N/A 04/03/2013   Procedure: LAPAROSCOPY DIAGNOSTIC;  Surgeon: Genia DelMarie-Lyne Lavoie, MD;  Location: [facility] OR[facility];  Service: Gynecology;  Laterality: N/A;  . ROBOTIC ASSISTED LAPAROSCOPIC LYSIS OF  ADHESION N/A 04/03/2013   Procedure: ROBOTIC ASSISTED LAPAROSCOPIC LYSIS OF ADHESION; LYSIS OF ENDOMETRIOSIS ;  Surgeon: Genia DelMarie-Lyne Lavoie, MD;  Location: [facility];  Service: Gynecology;  Laterality: N/A;    OB History    Gravida Para Term Preterm AB Living   2 1 1   1 1    SAB TAB Ectopic Multiple Live Births   1     0 1       Home Medications    Prior to Admission medications   Medication Sig Start Date End Date Taking? Authorizing Provider  HYDROcodone-acetaminophen (NORCO/VICODIN) 5-325 MG tablet Take 1-2 tablets by mouth every 4 (four) hours as needed. Patient not taking: Reported on 12/05/2016 09/14/16   Benjiman CorePickering, Nathan, MD  ketorolac (TORADOL) 10 MG tablet Take 1 tablet (10 mg total) by mouth 3 (three) times daily with meals as needed. 12/23/16   Loren RacerYelverton, David, MD  ondansetron (ZOFRAN ODT) 8 MG disintegrating tablet Take 1 tablet (8 mg total) by mouth every 8 (eight) hours as needed for nausea or vomiting. 12/05/16   Idol, Raynelle FanningJulie, PA-C  oxyCODONE-acetaminophen (PERCOCET) 5-325 MG tablet Take 1-2 tablets by mouth every 6 (six) hours as needed for severe pain. 12/23/16   Loren RacerYelverton, David, MD  pantoprazole (PROTONIX) 40 MG tablet Take 1 tablet (40 mg total) by mouth daily. Patient taking differently: Take 40 mg by mouth as needed.  10/14/16   Anice PaganiniGill, Eric A, NP  tamsulosin (FLOMAX) 0.4 MG CAPS capsule Take 1 capsule (0.4 mg total) by mouth daily. 12/23/16   Loren RacerYelverton, David, MD    Family History Family History  Problem Relation Age of Onset  . Irritable bowel syndrome Mother   . GI problems Sister        gastroparesis  . Colon cancer Neg Hx   . Inflammatory bowel disease Neg Hx   . Liver disease Neg Hx     Social History Social History  Substance Use Topics  . Smoking status: Former Smoker    Packs/day: 1.00    Years: 14.00    Types: Cigarettes    Start date: 03/08/1996    Quit date: 09/25/2013  . Smokeless tobacco: Never Used  . Alcohol use No     Allergies   Dilaudid  [hydromorphone hcl]   Review of Systems Review of Systems  Constitutional: Positive for fever.  Respiratory: Negative for shortness of breath.   Cardiovascular: Negative for chest pain.  Gastrointestinal: Positive for abdominal pain (from flank) and nausea. Negative for constipation, diarrhea and vomiting.  Genitourinary: Positive for dysuria, flank pain, frequency and urgency. Negative for hematuria, vaginal bleeding and vaginal discharge.       +malodorous urine  Musculoskeletal: Negative for arthralgias and myalgias.  Skin: Negative for color change.  Allergic/Immunologic: Negative for immunocompromised state.  Neurological: Negative for weakness and numbness.  Psychiatric/Behavioral: Negative for confusion.   All other systems reviewed and are negative for acute change except as noted in the HPI.    Physical Exam Updated Vital Signs BP (!) 146/91 (BP Location: Left Arm)   Pulse (!) 112   Temp 99.5 F (37.5 C) (Oral)   Resp 18   LMP 12/09/2016   SpO2 100%   Physical Exam  Constitutional: She is oriented  to person, place, and time. Vital signs are normal. She appears well-developed and well-nourished.  Non-toxic appearance. No distress.  Low grade temp 99.5, nontoxic, NAD  HENT:  Head: Normocephalic and atraumatic.  Mouth/Throat: Oropharynx is clear and moist and mucous membranes are normal.  Eyes: Conjunctivae and EOM are normal. Right eye exhibits no discharge. Left eye exhibits no discharge.  Neck: Normal range of motion. Neck supple.  Cardiovascular: Regular rhythm, normal heart sounds and intact distal pulses.  Tachycardia present.  Exam reveals no gallop and no friction rub.   No murmur heard. Mildly tachycardic  Pulmonary/Chest: Effort normal and breath sounds normal. No respiratory distress. She has no decreased breath sounds. She has no wheezes. She has no rhonchi. She has no rales.  Abdominal: Soft. Normal appearance and bowel sounds are normal. She exhibits no  distension. There is tenderness in the left upper quadrant and left lower quadrant. There is CVA tenderness. There is no rigidity, no rebound, no guarding, no tenderness at McBurney's point and negative Murphy's sign.  Soft, nondistended, +BS throughout, with mild L lateral abd TTP tracking towards the L flank, no r/g/r, neg murphy's, neg mcburney's, +L sided CVA TTP   Musculoskeletal: Normal range of motion.  Neurological: She is alert and oriented to person, place, and time. She has normal strength. No sensory deficit.  Skin: Skin is warm, dry and intact. No rash noted.  Psychiatric: She has a normal mood and affect.  Nursing note and vitals reviewed.    ED Treatments / Results  Labs (all labs ordered are listed, but only abnormal results are displayed) Labs Reviewed  URINALYSIS, ROUTINE W REFLEX MICROSCOPIC - Abnormal; Notable for the following:       Result Value   Hgb urine dipstick SMALL (*)    Bacteria, UA MANY (*)    Squamous Epithelial / LPF 0-5 (*)    All other components within normal limits  CBC WITH DIFFERENTIAL/PLATELET - Abnormal; Notable for the following:    WBC 13.2 (*)    HCT 35.1 (*)    Neutro Abs 10.4 (*)    All other components within normal limits  COMPREHENSIVE METABOLIC PANEL - Abnormal; Notable for the following:    Glucose, Bld 101 (*)    All other components within normal limits  URINE CULTURE  POC URINE PREG, ED  I-STAT CG4 LACTIC ACID, ED    EKG  EKG Interpretation None       Radiology No results found.   CT renal study 12/23/16 IMPRESSION: 1. Mildly obstructive 3 mm left UVJ stone, with moderate left hydroureter and mild left hydronephrosis. 2. Faint calcification along the medullary pyramids in the kidneys favoring mild medullary nephrocalcinosis. 3. Small cyst or follicle of the right ovary.  Electronically Signed   By: Gaylyn Rong M.D.   On: 12/23/2016 09:25   Procedures Procedures (including critical care  time)  Medications Ordered in ED Medications  ceFAZolin (ANCEF) powder 2 g (not administered)  ketorolac (TORADOL) 30 MG/ML injection 30 mg (30 mg Intravenous Given 12/25/16 1331)     Initial Impression / Assessment and Plan / ED Course  I have reviewed the triage vital signs and the nursing notes.  Pertinent labs & imaging results that were available during my care of the patient were reviewed by me and considered in my medical decision making (see chart for details).     41 y.o. female here with ongoing L flank pain and now spiked fever this morning; told to come  in by Dr. Retta Diones of urology, who actually came in to see pt as soon as I began my evaluation, and is taking her to the OR. Briefly, pt with known L UVJ 3mm stone, found on CT on 12/23/16; developed fever 101.9 this morning. On arrival, low grade temp 99.5, HR 112. On exam, +L CVA TTP and LLQ TTP, nonperitoneal. I ordered labs prior to Dr. Lenoria Chime arrival, and they show mild leukocytosis, CMP WNL, lactic WNL. U/A had been done earlier and shows many bacteria but nitrite and leuk neg; inquired on whether Dr. Retta Diones wanted me to order UTI abx and he stated he would order ancef for right before she went to surgery. He ordered toradol for pt, and pt is comfortable at this time. Dr. Retta Diones taking her to surgery now. Please see his notes for further documentation of care and dispo. I appreciate his help with this pleasant patient's care. Pt stable at time of admission for stent surgery.    Final Clinical Impressions(s) / ED Diagnoses   Final diagnoses:  Left flank pain  Nephrolithiasis  Pyelonephritis  Leukocytosis, unspecified type    New Prescriptions New Prescriptions   No medications on [address], [place], [place] 12/25/16 1411    Raeford Razor, MD 12/25/16 1810

## 2016-12-25 NOTE — Transfer of Care (Signed)
Immediate Anesthesia Transfer of Care Note  Patient: Oneal GroutStephanie T Gillin  Procedure(s) Performed: Procedure(s): CYSTOSCOPY WITH LEFT RETROGRADE PYELOGRAM/LEFT STONE EXTRACTION/LEFT URETERAL STENT PLACEMENT (Left)  Patient Location: PACU  Anesthesia Type:General  Level of Consciousness: awake, alert , oriented and patient cooperative  Airway & Oxygen Therapy: Patient Spontanous Breathing and Patient connected to face mask oxygen  Post-op Assessment: Report given to RN, Post -op Vital signs reviewed and stable and Patient moving all extremities X 4  Post vital signs: stable  Last Vitals:  Vitals:   12/25/16 1210 12/25/16 1516  BP: (!) 146/91 (P) 113/71  Pulse: (!) 112   Resp: 18 (!) (P) 26  Temp: 37.5 C (P) 37.3 C    Last Pain:  Vitals:   12/25/16 1237  TempSrc:   PainSc: 8          Complications: No apparent anesthesia complications

## 2016-12-25 NOTE — Anesthesia Procedure Notes (Addendum)
Procedure Name: LMA Insertion Date/Time: 12/25/2016 2:46 PM Performed by: Anastasio ChampionEVANS, Vernel Donlan E Pre-anesthesia Checklist: Patient identified, Emergency Drugs available, Suction available and Patient being monitored Patient Re-evaluated:Patient Re-evaluated prior to inductionOxygen Delivery Method: Circle system utilized Preoxygenation: Pre-oxygenation with 100% oxygen Intubation Type: IV induction Ventilation: Mask ventilation without difficulty LMA: LMA with gastric port inserted LMA Size: 4.0 Tube type: Oral (sump down gastric port and stomach decompressed) Number of attempts: 1 Airway Equipment and Method: Oral airway Placement Confirmation: positive ETCO2 Tube secured with: Tape Dental Injury: Teeth and Oropharynx as per pre-operative assessment

## 2016-12-26 MED ORDER — KETOROLAC TROMETHAMINE 30 MG/ML IJ SOLN
30.0000 mg | Freq: Four times a day (QID) | INTRAMUSCULAR | Status: DC
  Administered 2016-12-26 – 2016-12-27 (×6): 30 mg via INTRAVENOUS
  Filled 2016-12-26 (×6): qty 1

## 2016-12-26 MED ORDER — MAGNESIUM HYDROXIDE 400 MG/5ML PO SUSP
30.0000 mL | Freq: Every day | ORAL | Status: DC | PRN
  Administered 2016-12-27: 30 mL via ORAL
  Filled 2016-12-26: qty 30

## 2016-12-26 MED ORDER — MORPHINE SULFATE (PF) 4 MG/ML IV SOLN
4.0000 mg | INTRAVENOUS | Status: DC | PRN
  Administered 2016-12-26 (×2): 4 mg via INTRAVENOUS
  Filled 2016-12-26 (×2): qty 1

## 2016-12-26 NOTE — Progress Notes (Signed)
1 Day Post-Op Subjective: Patient reports some back pain and left lower quadrant pain.  This has required Toradol and oxycodone.  Objective: Vital signs in last 24 hours: Temp:  [98.1 F (36.7 C)-99.5 F (37.5 C)] 98.1 F (36.7 C) (07/08 0531) Pulse Rate:  [49-118] 70 (07/08 0531) Resp:  [16-26] 20 (07/08 0531) BP: (111-146)/(59-91) 113/59 (07/08 0531) SpO2:  [80 %-100 %] 98 % (07/08 0531) Weight:  [90.7 kg (200 lb)] 90.7 kg (200 lb) (07/08 0531)  Intake/Output from previous day: 07/07 0701 - 07/08 0700 In: 2056.3 [P.O.:320; I.V.:1686.3; IV Piggyback:50] Out: 2200 [Urine:2200] Intake/Output this shift: No intake/output data recorded.  Physical Exam:  Constitutional: Vital signs reviewed. WD WN in NAD   Eyes: PERRL, No scleral icterus.   Cardiovascular: RRR Pulmonary/Chest: Normal effort Extremities: No cyanosis or edema   Lab Results:  Recent Labs  12/23/16 0803 12/25/16 1316  HGB 13.1 12.1  HCT 39.1 35.1*   BMET  Recent Labs  12/23/16 0803 12/25/16 1316  NA 138 135  K 3.4* 3.8  CL 105 104  CO2 25 25  GLUCOSE 102* 101*  BUN 9 10  CREATININE 0.67 0.83  CALCIUM 9.2 8.9   No results for input(s): LABPT, INR in the last 72 hours. No results for input(s): LABURIN in the last 72 hours. Results for orders placed or performed in visit on 02/25/16  Urine Culture     Status: None   Collection Time: 02/25/16 12:00 AM  Result Value Ref Range Status   Urine Culture, Routine Final report  Final   Organism ID, Bacteria Comment  Final    Comment: Culture shows less than 10,000 colony forming units of bacteria per milliliter of urine. This colony count is not generally considered to be clinically significant.     Studies/Results: Dg C-arm 1-60 Min-no Report  Result Date: 12/25/2016 Fluoroscopy was utilized by the requesting physician.  No radiographic interpretation.    Assessment/Plan:   Postoperative day #1, stent placement/basket extraction for distal  left ureteral stone with UTI.  She is still having pain.  She had chills overnight.  Seeing that she had a preoperative temperature of greater than 101, I will even one more day for antibiotic administration.   LOS: 1 day   Chelsea AusDAHLSTEDT, Joylene Wescott M 12/26/2016, 7:46 AM

## 2016-12-27 ENCOUNTER — Encounter (HOSPITAL_COMMUNITY): Payer: Self-pay | Admitting: Urology

## 2016-12-27 ENCOUNTER — Inpatient Hospital Stay (HOSPITAL_COMMUNITY): Payer: 59

## 2016-12-27 LAB — URINE CULTURE

## 2016-12-27 LAB — HIV ANTIBODY (ROUTINE TESTING W REFLEX): HIV Screen 4th Generation wRfx: NONREACTIVE

## 2016-12-27 MED ORDER — CEPHALEXIN 500 MG PO CAPS: 500.0000 mg | ORAL_CAPSULE | Freq: Three times a day (TID) | ORAL | 0 refills | Status: DC

## 2016-12-27 MED ORDER — OXYCODONE HCL 5 MG PO TABS: 10.0000 mg | ORAL_TABLET | ORAL | 0 refills | Status: DC | PRN

## 2016-12-27 MED ORDER — OXYBUTYNIN CHLORIDE 5 MG PO TABS: 5.0000 mg | ORAL_TABLET | Freq: Three times a day (TID) | ORAL | 0 refills | Status: DC | PRN

## 2016-12-27 NOTE — Plan of Care (Signed)
Problem: Education: Goal: Knowledge of [facility] Education information/materials will improve Outcome: Completed/Met Date Met: 12/27/16 .  Problem: Safety: Goal: Ability to remain free from injury will improve Outcome: Completed/Met Date Met: 12/27/16 .  Problem: Health Behavior/Discharge Planning: Goal: Ability to manage health-related needs will improve Outcome: Completed/Met Date Met: 12/27/16 .  Problem: Pain Managment: Goal: General experience of comfort will improve Outcome: Progressing Pain 8-9/10 to lower abdomen and flank. Toradol given as ordered.   Pt states Toradol works best.  Pain still 8/10. Will continue with plan of care and continue to monitor.

## 2016-12-27 NOTE — Care Management Note (Signed)
Case Management Note  Patient Details  Name: Oneal GroutStephanie T Reede MRN: [id number] Date of Birth: 1976/04/04  Subjective/Objective:  41 y/o f admitted w/ureteral calculus. From home.                  Action/Plan:d/c home.   Expected Discharge Date:  12/27/16               Expected Discharge Plan:  Home/Self Care  In-House Referral:     Discharge planning Services  CM Consult  Post Acute Care Choice:    Choice offered to:     DME Arranged:    DME Agency:     HH Arranged:    HH Agency:     Status of Service:  Completed, signed off  If discussed at [employer] Length of Stay Meetings, dates discussed:    Additional Comments:  Lanier ClamMahabir, Linzey Ramser, RN 12/27/2016, 11:45 AM

## 2016-12-27 NOTE — Discharge Summary (Signed)
Patient ID: Brianna Davis MRN: [id number] DOB/AGE: 1976-01-19 41 y.o.  Admit date: 12/25/2016 Discharge date: 12/27/2016  Primary Care Physician:  Babs Sciara, MD  Discharge Diagnoses:   Present on Admission: . Ureteral calculus Left hydronephrosis  Consults:  None   Discharge Medications: Allergies as of 12/27/2016      Reactions   Dilaudid [hydromorphone Hcl] Nausea And Vomiting   Severe nausea and vomiting      Medication List    STOP taking these medications   HYDROcodone-acetaminophen 5-325 MG tablet Commonly known as:  NORCO/VICODIN   ketorolac 10 MG tablet Commonly known as:  TORADOL     TAKE these medications   carboxymethylcellulose 1 % ophthalmic solution Place 1 drop into both eyes daily as needed (dry eyes).   cephALEXin 500 MG capsule Commonly known as:  KEFLEX Take 1 capsule (500 mg total) by mouth 3 (three) times daily.   ondansetron 8 MG disintegrating tablet Commonly known as:  ZOFRAN ODT Take 1 tablet (8 mg total) by mouth every 8 (eight) hours as needed for nausea or vomiting.   oxybutynin 5 MG tablet Commonly known as:  DITROPAN Take 1 tablet (5 mg total) by mouth every 8 (eight) hours as needed for bladder spasms.   oxyCODONE 5 MG immediate release tablet Commonly known as:  ROXICODONE Take 2 tablets (10 mg total) by mouth every 4 (four) hours as needed for severe pain.   oxyCODONE-acetaminophen 5-325 MG tablet Commonly known as:  PERCOCET Take 1-2 tablets by mouth every 6 (six) hours as needed for severe pain.   pantoprazole 40 MG tablet Commonly known as:  PROTONIX Take 1 tablet (40 mg total) by mouth daily.   tamsulosin 0.4 MG Caps capsule Commonly known as:  FLOMAX Take 1 capsule (0.4 mg total) by mouth daily.        Significant Diagnostic Studies:  Dg C-arm 1-60 Min-no Report  Result Date: 12/25/2016 Fluoroscopy was utilized by the requesting physician.  No radiographic interpretation.    Brief H and P: For  complete details please refer to admission H and P, but in brief the patient was admitted for management of an obstructing left distal ureteral stone with possible UTI and fever.  Hospital Course:  Active Problems:   Ureteral calculus She underwent urgent cysto/ureteroscopy, left stone basketing and J2 stent extraction on the day of admission. She remained afebrile postop, and the biggest issue was stent pain. KUB confirmed normal stent location. She was d/ced to be followed up in 1 day for stent extraction.  Day of Discharge BP 126/83 (BP Location: Right Arm)   Pulse 73   Temp 98.4 F (36.9 C) (Oral)   Resp 16   Ht 5\' 4"  (1.626 m)   Wt 90.7 kg (200 lb)   LMP 12/09/2016   SpO2 97%   BMI 34.33 kg/m   No results found for this or any previous visit (from the past 24 hour(s)).  Physical Exam: General: Alert and awake oriented x3 not in any acute distress. HEENT: anicteric sclera, pupils reactive to light and accommodation CVS: S1-S2 clear no murmur rubs or gallops Chest: clear to auscultation bilaterally, no wheezing rales or rhonchi Abdomen: soft nontender, nondistended, normal bowel sounds, no organomegaly Extremities: no cyanosis, clubbing or edema noted bilaterally Neuro: Cranial nerves II-XII intact, no focal neurological deficits  Disposition:  Home  Diet:  Regular  Activity:  Gradually increase     TESTS THAT NEED FOLLOW-UP  Urine culture  DISCHARGE FOLLOW-UP Follow-up  Information    Marcine Matarahlstedt, Ilah Boule, MD.   Specialty:  Urology Why:  Don't keep July 9 appointment.  We will call to reschedule. Contact information: [address] [place] [address] [phone]           Time spent on Discharge:  20 mins  Signed: Chelsea AusDAHLSTEDT, Jeily Guthridge M 12/27/2016, 11:15 AM

## 2016-12-28 ENCOUNTER — Encounter (HOSPITAL_COMMUNITY): Payer: Self-pay | Admitting: Urology

## 2016-12-29 ENCOUNTER — Emergency Department (HOSPITAL_COMMUNITY)
Admission: EM | Admit: 2016-12-29 | Discharge: 2016-12-29 | Disposition: A | Discharge status: Home / Self Care | Payer: 59 | Attending: Emergency Medicine | Admitting: Emergency Medicine

## 2016-12-29 ENCOUNTER — Encounter (HOSPITAL_COMMUNITY): Payer: Self-pay | Admitting: Emergency Medicine

## 2016-12-29 DIAGNOSIS — Z79899 Other long term (current) drug therapy: Secondary | ICD-10-CM | POA: Diagnosis not present

## 2016-12-29 DIAGNOSIS — Z87442 Personal history of urinary calculi: Secondary | ICD-10-CM | POA: Diagnosis not present

## 2016-12-29 DIAGNOSIS — R109 Unspecified abdominal pain: Secondary | ICD-10-CM | POA: Insufficient documentation

## 2016-12-29 DIAGNOSIS — R110 Nausea: Secondary | ICD-10-CM | POA: Insufficient documentation

## 2016-12-29 DIAGNOSIS — Z87891 Personal history of nicotine dependence: Secondary | ICD-10-CM | POA: Insufficient documentation

## 2016-12-29 LAB — URINALYSIS, ROUTINE W REFLEX MICROSCOPIC
Bacteria, UA: NONE SEEN
Bilirubin Urine: NEGATIVE
Glucose, UA: NEGATIVE mg/dL
Ketones, ur: NEGATIVE mg/dL
Leukocytes, UA: NEGATIVE
Nitrite: NEGATIVE
Protein, ur: NEGATIVE mg/dL
Specific Gravity, Urine: 1.006 (ref 1.005–1.030)
pH: 7 (ref 5.0–8.0)

## 2016-12-29 LAB — CBC WITH DIFFERENTIAL/PLATELET
Basophils Absolute: 0 10*3/uL (ref 0.0–0.1)
Basophils Relative: 0 %
Eosinophils Absolute: 0.1 10*3/uL (ref 0.0–0.7)
Eosinophils Relative: 2 %
HCT: 33.7 % — ABNORMAL LOW (ref 36.0–46.0)
Hemoglobin: 11.4 g/dL — ABNORMAL LOW (ref 12.0–15.0)
Lymphocytes Relative: 28 %
Lymphs Abs: 2 10*3/uL (ref 0.7–4.0)
MCH: 30 pg (ref 26.0–34.0)
MCHC: 33.8 g/dL (ref 30.0–36.0)
MCV: 88.7 fL (ref 78.0–100.0)
Monocytes Absolute: 0.7 10*3/uL (ref 0.1–1.0)
Monocytes Relative: 10 %
Neutro Abs: 4.4 10*3/uL (ref 1.7–7.7)
Neutrophils Relative %: 60 %
Platelets: 276 10*3/uL (ref 150–400)
RBC: 3.8 MIL/uL — ABNORMAL LOW (ref 3.87–5.11)
RDW: 12.7 % (ref 11.5–15.5)
WBC: 7.3 10*3/uL (ref 4.0–10.5)

## 2016-12-29 LAB — BASIC METABOLIC PANEL
Anion gap: 8 (ref 5–15)
BUN: 10 mg/dL (ref 6–20)
CO2: 23 mmol/L (ref 22–32)
Calcium: 9 mg/dL (ref 8.9–10.3)
Chloride: 105 mmol/L (ref 101–111)
Creatinine, Ser: 0.87 mg/dL (ref 0.44–1.00)
GFR calc Af Amer: >60 mL/min (ref >60)
GFR calc non Af Amer: >60 mL/min (ref >60)
Glucose, Bld: 98 mg/dL (ref 65–99)
Potassium: 3.7 mmol/L (ref 3.5–5.1)
Sodium: 136 mmol/L (ref 135–145)

## 2016-12-29 MED ORDER — SODIUM CHLORIDE 0.9 % IV BOLUS (SEPSIS)
1000.0000 mL | Freq: Once | INTRAVENOUS | Status: AC
  Administered 2016-12-29: 1000 mL via INTRAVENOUS

## 2016-12-29 MED ORDER — KETOROLAC TROMETHAMINE 30 MG/ML IJ SOLN
15.0000 mg | Freq: Once | INTRAMUSCULAR | Status: AC
  Administered 2016-12-29: 15 mg via INTRAVENOUS
  Filled 2016-12-29: qty 1

## 2016-12-29 MED ORDER — OXYCODONE-ACETAMINOPHEN 5-325 MG PO TABS: 1.0000 | ORAL_TABLET | ORAL | 0 refills | Status: DC | PRN

## 2016-12-29 MED ORDER — MORPHINE SULFATE (PF) 4 MG/ML IV SOLN
4.0000 mg | Freq: Once | INTRAVENOUS | Status: AC
  Administered 2016-12-29: 4 mg via INTRAVENOUS
  Filled 2016-12-29: qty 1

## 2016-12-29 MED ORDER — PROMETHAZINE HCL 25 MG/ML IJ SOLN
12.5000 mg | Freq: Once | INTRAMUSCULAR | Status: AC
  Administered 2016-12-29: 12.5 mg via INTRAVENOUS
  Filled 2016-12-29: qty 1

## 2016-12-29 NOTE — ED Provider Notes (Signed)
[facility]-EMERG[facility]Y DEPT Provider Note   CSN: [id number] Arrival date & time: 12/29/16  1757  By signing my name below, I, Linna Darner, attest that this documentation has been prepared under the direction and in the presence of physician practitioner, Raeford Razor, MD. Electronically Signed: Linna Darner, Scribe. 12/29/2016. 6:50 PM.  History   Chief Complaint Chief Complaint  Patient presents with  . Flank Pain   The history is provided by the patient. No language interpreter was used.    HPI Comments: Brianna Davis is a 41 y.o. female with a h/o nephrolithiasis, s/p cytsoscopy with left ureteral stent placement on 7/7, who presents to the Emergency Department complaining of persistent left flank pain. On 7/5, patient was evaluated here for several weeks of persistent and worsening left flank pain. Her CT revealed a 3 mm stone in the UVJ on the left. Patient was discharged with Toradol and advised to return to the ED should her symptoms worsen. She was then evaluated on 7/7 in the [facility] ED after developing a fever and was taken to the OR shortly thereafter for a ureteral stent placement. She was discharged on 7/9 after stent removal. Patient states that she has had 10/10 constant left flank pain since the stent was removed. She states her pain feels exactly the same as a kidney stone. Patient's associated symptoms prior to admission, including nausea, urinary urgency/frequency, urinary retention, and intermittent fevers, are still present but improving. Patient received Rocephin IV during her hospital admission and was discharged home with Keflex which she has taken as prescribed. She also has oxycodone at home which has provided mild improvement of her pain. She has been having normal bowel movements. Patient denies vomiting, vaginal bleeding/discharge, or any other associated symptoms.  Of note, during the operation, the stone was pulled out through the ureteral orifice into the bladder but  never recovered.   Past Medical History:  Diagnosis Date  . Anxiety   . Degenerative disc disease   . Degenerative disc disease, lumbar   . IBS (irritable bowel syndrome)   . Migraine   . Nephrolithiasis   . S/P endoscopy April 2012   Dr. Jena Gauss: friability at GE junction, negative Barrett's orH.pylori, diffuse gastric petechia    Patient Active Problem List   Diagnosis Date Noted  . Ureteral calculus 12/25/2016  . RUQ pain 10/14/2016  . Nausea without vomiting 10/14/2016  . Generalized anxiety disorder 09/10/2016  . Postpartum care following cesarean delivery (12/19) 06/08/2014  . Postoperative state 06/08/2014  . Normal labor 06/06/2014  . Pregnancy 05/17/2014  . Gestational hypertension w/o significant proteinuria in 3rd trimester 05/15/2014  . Acute pyelonephritis 09/11/2012  . GERD (gastroesophageal reflux disease) 09/28/2010  . Epigastric pain 09/28/2010  . Dysphagia 09/28/2010  . IBS (irritable bowel syndrome) 09/28/2010  . Esophageal dysphagia 09/28/2010    Past Surgical History:  Procedure Laterality Date  . CESAREAN SECTION N/A 06/07/2014   Procedure: CESAREAN SECTION;  Surgeon: Genia Del, MD;  Location: [facility];  Service: Obstetrics;  Laterality: N/A;  . CHROMOPERTUBATION Bilateral 04/03/2013   Procedure: CHROMOPERTUBATION with Methylene Blue, with patent right tube and proximal obstruction of left tube;  Surgeon: Genia Del, MD;  Location: [facility];  Service: Gynecology;  Laterality: Bilateral;  . CYSTOSCOPY W/ URETERAL STENT PLACEMENT Left 12/25/2016   Procedure: CYSTOSCOPY WITH LEFT RETROGRADE PYELOGRAM/LEFT STONE EXTRACTION/LEFT URETERAL STENT PLACEMENT;  Surgeon: Marcine Matar, MD;  Location: [facility];  Service: Urology;  Laterality: Left;  . ESOPHAGOGASTRODUODENOSCOPY  09/30/2010   LKG:[facility]  hiatal hernia/Friability at the gastroesophageal junction with undulating Z-line versus short segment Barrett esophagus s/p bx  . ileocolonoscopy  12/2007    Friable anal canal.  . LAPAROSCOPY N/A 04/03/2013   Procedure: LAPAROSCOPY DIAGNOSTIC;  Surgeon: Genia Del, MD;  Location: [facility];  Service: Gynecology;  Laterality: N/A;  . ROBOTIC ASSISTED LAPAROSCOPIC LYSIS OF ADHESION N/A 04/03/2013   Procedure: ROBOTIC ASSISTED LAPAROSCOPIC LYSIS OF ADHESION; LYSIS OF ENDOMETRIOSIS ;  Surgeon: Genia Del, MD;  Location: [facility];  Service: Gynecology;  Laterality: N/A;    OB History    Gravida Para Term Preterm AB Living   2 1 1   1 1    SAB TAB Ectopic Multiple Live Births   1     0 1       Home Medications    Prior to Admission medications   Medication Sig Start Date End Date Taking? Authorizing Provider  carboxymethylcellulose 1 % ophthalmic solution Place 1 drop into both eyes daily as needed (dry eyes).    [provider]  cephALEXin (KEFLEX) 500 MG capsule Take 1 capsule (500 mg total) by mouth 3 (three) times daily. 12/27/16   Marcine Matar, MD  ketorolac (TORADOL) 10 MG tablet Take 1 tablet by mouth 3 (three) times daily with meals as needed. 12/23/16   [provider]  ondansetron (ZOFRAN ODT) 8 MG disintegrating tablet Take 1 tablet (8 mg total) by mouth every 8 (eight) hours as needed for nausea or vomiting. Patient not taking: Reported on 12/25/2016 12/05/16   Burgess Amor, PA-C  oxybutynin (DITROPAN) 5 MG tablet Take 1 tablet (5 mg total) by mouth every 8 (eight) hours as needed for bladder spasms. 12/27/16   Marcine Matar, MD  oxyCODONE (ROXICODONE) 5 MG immediate release tablet Take 2 tablets (10 mg total) by mouth every 4 (four) hours as needed for severe pain. 12/27/16   Marcine Matar, MD  oxyCODONE-acetaminophen (PERCOCET) 5-325 MG tablet Take 1-2 tablets by mouth every 6 (six) hours as needed for severe pain. 12/23/16   Loren Racer, MD  pantoprazole (PROTONIX) 40 MG tablet Take 1 tablet (40 mg total) by mouth daily. Patient not taking: Reported on 12/25/2016 10/14/16   Anice Paganini, NP  tamsulosin  (FLOMAX) 0.4 MG CAPS capsule Take 1 capsule (0.4 mg total) by mouth daily. 12/23/16   Loren Racer, MD    Family History Family History  Problem Relation Age of Onset  . Irritable bowel syndrome Mother   . GI problems Sister        gastroparesis  . Colon cancer Neg Hx   . Inflammatory bowel disease Neg Hx   . Liver disease Neg Hx     Social History Social History  Substance Use Topics  . Smoking status: Former Smoker    Packs/day: 1.00    Years: 14.00    Types: Cigarettes    Start date: 03/08/1996    Quit date: 09/25/2013  . Smokeless tobacco: Never Used  . Alcohol use No     Allergies   Dilaudid [hydromorphone hcl]   Review of Systems Review of Systems  All other systems reviewed and are negative.  Physical Exam Updated Vital Signs BP 116/84 (BP Location: Right Arm)   Pulse (!) 110   Temp 99.1 F (37.3 C) (Oral)   Resp 18   Ht 5\' 4"  (1.626 m)   Wt 200 lb (90.7 kg)   LMP 12/09/2016   SpO2 95%   BMI 34.33 kg/m   Physical Exam  Constitutional: She appears well-developed and well-nourished.  Appears uncomfortable.  HENT:  Head: Normocephalic.  Right Ear: External ear normal.  Left Ear: External ear normal.  Nose: Nose normal.  Mouth/Throat: Oropharynx is clear and moist.  Eyes: Conjunctivae are normal. Right eye exhibits no discharge. Left eye exhibits no discharge.  Neck: Normal range of motion.  Cardiovascular: Normal rate, regular rhythm and normal heart sounds.   No murmur heard. Pulmonary/Chest: Effort normal and breath sounds normal. No respiratory distress. She has no wheezes. She has no rales.  Abdominal: Soft. She exhibits no distension. There is tenderness. There is CVA tenderness. There is no rebound and no guarding.  Suprapubic and left flank tenderness. Left CVA tenderness.  Musculoskeletal: Normal range of motion. She exhibits no edema or tenderness.  Neurological: She is alert. No cranial nerve deficit. Coordination normal.  Skin: Skin is  warm and dry. No rash noted. No erythema. No pallor.  Psychiatric: She has a normal mood and affect. Her behavior is normal.  Nursing note and vitals reviewed.  ED Treatments / Results  Labs (all labs ordered are listed, but only abnormal results are displayed) Labs Reviewed  URINALYSIS, ROUTINE W REFLEX MICROSCOPIC - Abnormal; Notable for the following:       Result Value   Color, Urine STRAW (*)    Hgb urine dipstick MODERATE (*)    Squamous Epithelial / LPF 0-5 (*)    All other components within normal limits  CBC WITH DIFFERENTIAL/PLATELET - Abnormal; Notable for the following:    RBC 3.80 (*)    Hemoglobin 11.4 (*)    HCT 33.7 (*)    All other components within normal limits  BASIC METABOLIC PANEL    EKG  EKG Interpretation None       Radiology No results found.  Procedures Procedures (including critical care time)  DIAGNOSTIC STUDIES: Oxygen Saturation is 95% on RA, adequate by my interpretation.    COORDINATION OF CARE: 6:43 PM Discussed treatment plan with pt at bedside and pt agreed to plan.  Medications Ordered in ED Medications - No data to display   Initial Impression / Assessment and Plan / ED Course  I have reviewed the triage vital signs and the nursing notes.  Pertinent labs & imaging results that were available during my care of the patient were reviewed by me and considered in my medical decision making (see chart for details).     Suspect this is continued stent colic. She is nontoxic. Reassurance. Continued symptomatic tx and I expect this to progressively improve from this point. It has been determined that no acute conditions requiring further emergency intervention are present at this time. The patient has been advised of the diagnosis and plan. I reviewed any labs and imaging including any potential incidental findings. We have discussed signs and symptoms that warrant return to the ED and they are listed in the discharge instructions.     Final Clinical Impressions(s) / ED Diagnoses   Final diagnoses:  Flank pain    New Prescriptions New Prescriptions   No medications on file   I personally preformed the services scribed in my presence. The recorded information has been reviewed is accurate. Raeford RazorStephen Lorrene Graef, MD.    Raeford RazorKohut, Fahad Cisse, MD 01/07/17 [phone]

## 2016-12-29 NOTE — ED Triage Notes (Signed)
Patient complains of left flank pain. Patient states she had stent place Saturday to remove stone. She also states that she was admitted to the hospital and released last Monday. She says that her stint was removed yesterday but pain has increased.

## 2017-05-01 ENCOUNTER — Emergency Department (HOSPITAL_COMMUNITY)
Admission: EM | Admit: 2017-05-01 | Discharge: 2017-05-01 | Disposition: A | Discharge status: Home / Self Care | Payer: 59 | Attending: Emergency Medicine | Admitting: Emergency Medicine

## 2017-05-01 ENCOUNTER — Encounter (HOSPITAL_COMMUNITY): Payer: Self-pay

## 2017-05-01 DIAGNOSIS — Z5321 Procedure and treatment not carried out due to patient leaving prior to being seen by health care provider: Secondary | ICD-10-CM | POA: Insufficient documentation

## 2017-05-01 DIAGNOSIS — R1011 Right upper quadrant pain: Secondary | ICD-10-CM | POA: Diagnosis present

## 2017-05-01 DIAGNOSIS — R1110 Vomiting, unspecified: Secondary | ICD-10-CM | POA: Insufficient documentation

## 2017-05-01 LAB — COMPREHENSIVE METABOLIC PANEL
ALBUMIN: 4.3 g/dL (ref 3.5–5.0)
ALT: 20 U/L (ref 14–54)
ANION GAP: 7 (ref 5–15)
AST: 22 U/L (ref 15–41)
Alkaline Phosphatase: 99 U/L (ref 38–126)
BUN: 11 mg/dL (ref 6–20)
CHLORIDE: 101 mmol/L (ref 101–111)
CO2: 27 mmol/L (ref 22–32)
Calcium: 9.1 mg/dL (ref 8.9–10.3)
Creatinine, Ser: 0.75 mg/dL (ref 0.44–1.00)
GFR calc Af Amer: >60 mL/min (ref >60)
GLUCOSE: 107 mg/dL — AB (ref 65–99)
POTASSIUM: 3.4 mmol/L — AB (ref 3.5–5.1)
Sodium: 135 mmol/L (ref 135–145)
Total Bilirubin: 0.6 mg/dL (ref 0.3–1.2)
Total Protein: 7.6 g/dL (ref 6.5–8.1)

## 2017-05-01 LAB — CBC
HEMATOCRIT: 38.6 % (ref 36.0–46.0)
HEMOGLOBIN: 13.1 g/dL (ref 12.0–15.0)
MCH: 30.7 pg (ref 26.0–34.0)
MCHC: 33.9 g/dL (ref 30.0–36.0)
MCV: 90.4 fL (ref 78.0–100.0)
Platelets: 338 10*3/uL (ref 150–400)
RBC: 4.27 MIL/uL (ref 3.87–5.11)
RDW: 13.3 % (ref 11.5–15.5)
WBC: 8.6 10*3/uL (ref 4.0–10.5)

## 2017-05-01 LAB — LIPASE, BLOOD: LIPASE: 27 U/L (ref 11–51)

## 2017-05-01 NOTE — ED Notes (Signed)
[facility]our[facility] rounding, informed of 5 hour wait, thanked for patience, assured would be seen as soon as possible

## 2017-05-01 NOTE — ED Notes (Signed)
No answer in waiting room 

## 2017-05-01 NOTE — ED Triage Notes (Signed)
Patient reports of RUQ pain that radiates into back. Had kidney stone removed in July. Reports of episode of vomiting this morning.

## 2017-05-01 NOTE — ED Notes (Signed)
No answer in WR

## 2017-05-24 ENCOUNTER — Ambulatory Visit: Payer: 59 | Admitting: Internal Medicine

## 2017-07-05 ENCOUNTER — Ambulatory Visit: Payer: 59 | Admitting: Internal Medicine

## 2017-07-18 ENCOUNTER — Emergency Department (HOSPITAL_COMMUNITY)
Admission: EM | Admit: 2017-07-18 | Discharge: 2017-07-19 | Disposition: A | Discharge status: Home / Self Care | Payer: 59 | Attending: Emergency Medicine | Admitting: Emergency Medicine

## 2017-07-18 ENCOUNTER — Other Ambulatory Visit: Payer: Self-pay

## 2017-07-18 ENCOUNTER — Encounter (HOSPITAL_COMMUNITY): Payer: Self-pay | Admitting: Emergency Medicine

## 2017-07-18 DIAGNOSIS — Z79899 Other long term (current) drug therapy: Secondary | ICD-10-CM | POA: Insufficient documentation

## 2017-07-18 DIAGNOSIS — I10 Essential (primary) hypertension: Secondary | ICD-10-CM | POA: Diagnosis not present

## 2017-07-18 DIAGNOSIS — Z87891 Personal history of nicotine dependence: Secondary | ICD-10-CM | POA: Diagnosis not present

## 2017-07-18 DIAGNOSIS — R1011 Right upper quadrant pain: Secondary | ICD-10-CM | POA: Insufficient documentation

## 2017-07-18 LAB — CBC WITH DIFFERENTIAL/PLATELET
BASOS ABS: 0 10*3/uL (ref 0.0–0.1)
BASOS PCT: 0 %
EOS PCT: 3 %
Eosinophils Absolute: 0.4 10*3/uL (ref 0.0–0.7)
HEMATOCRIT: 38.1 % (ref 36.0–46.0)
Hemoglobin: 12.6 g/dL (ref 12.0–15.0)
Lymphocytes Relative: 29 %
Lymphs Abs: 3 10*3/uL (ref 0.7–4.0)
MCH: 30.3 pg (ref 26.0–34.0)
MCHC: 33.1 g/dL (ref 30.0–36.0)
MCV: 91.6 fL (ref 78.0–100.0)
MONO ABS: 0.7 10*3/uL (ref 0.1–1.0)
Monocytes Relative: 7 %
NEUTROS ABS: 6.1 10*3/uL (ref 1.7–7.7)
Neutrophils Relative %: 61 %
PLATELETS: 335 10*3/uL (ref 150–400)
RBC: 4.16 MIL/uL (ref 3.87–5.11)
RDW: 12.9 % (ref 11.5–15.5)
WBC: 10.3 10*3/uL (ref 4.0–10.5)

## 2017-07-18 LAB — URINALYSIS, ROUTINE W REFLEX MICROSCOPIC
BILIRUBIN URINE: NEGATIVE
Glucose, UA: NEGATIVE mg/dL
HGB URINE DIPSTICK: NEGATIVE
Ketones, ur: NEGATIVE mg/dL
NITRITE: NEGATIVE
PH: 6 (ref 5.0–8.0)
Protein, ur: NEGATIVE mg/dL
SPECIFIC GRAVITY, URINE: 1.018 (ref 1.005–1.030)

## 2017-07-18 LAB — COMPREHENSIVE METABOLIC PANEL
ALT: 15 U/L (ref 14–54)
AST: 20 U/L (ref 15–41)
Albumin: 4 g/dL (ref 3.5–5.0)
Alkaline Phosphatase: 88 U/L (ref 38–126)
Anion gap: 10 (ref 5–15)
BILIRUBIN TOTAL: 0.3 mg/dL (ref 0.3–1.2)
BUN: 12 mg/dL (ref 6–20)
CO2: 24 mmol/L (ref 22–32)
Calcium: 9.4 mg/dL (ref 8.9–10.3)
Chloride: 104 mmol/L (ref 101–111)
Creatinine, Ser: 0.71 mg/dL (ref 0.44–1.00)
GFR calc Af Amer: >60 mL/min (ref >60)
Glucose, Bld: 100 mg/dL — ABNORMAL HIGH (ref 65–99)
POTASSIUM: 3.4 mmol/L — AB (ref 3.5–5.1)
Sodium: 138 mmol/L (ref 135–145)
TOTAL PROTEIN: 7.5 g/dL (ref 6.5–8.1)

## 2017-07-18 LAB — LIPASE, BLOOD: LIPASE: 33 U/L (ref 11–51)

## 2017-07-18 MED ORDER — DICYCLOMINE HCL 10 MG/ML IM SOLN
20.0000 mg | Freq: Once | INTRAMUSCULAR | Status: AC
  Administered 2017-07-18: 20 mg via INTRAMUSCULAR
  Filled 2017-07-18: qty 2

## 2017-07-18 NOTE — ED Provider Notes (Signed)
[facility] EMERGENCY DEPARTMENT Provider Note   CSN: [id number] Arrival date & time: 07/18/17  2153  Time seen 23:08 PM    History   Chief Complaint Chief Complaint  Patient presents with  . Abdominal Pain    HPI Brianna Davis is a 42 y.o. female.  HPI patient states she has been having right upper quadrant pain radiating into her right back off and on for the past year.  She states normally it does not last too long.  However she had it most the day yesterday.  When she woke up this morning she did not have it however it started about 1:30 PM.  She states all she ate for lunch were peanut butter crackers.  She does not relate the pain to food.  She states that was occurring about once a month but since Christmas she has had 5-6 episodes.  She states she is never had episodes 2 days in a row.  She states that 8:30 PM it got a lot worse however now it is eased off.  She can only describe the pain as "hurts".  She states sometimes there is some sharp pain also.  She states nothing she does makes it hurt more, nothing she does makes it feel better.  She is tried heat and soaking in a tub.  She states she had nausea yesterday and today which is unusual, she does not have vomiting.  She denies diarrhea, constipation, fever, or any urinary symptoms.  She states she has had a inflamed gallbladder and she also had a kidney stone last year that required cystoscopy and a stent placement.  PCP Patient, No Pcp Per GI Dr Jena Gauss Urology Dr Retta Diones Dermatology Dr Michell Heinrich  Past Medical History:  Diagnosis Date  . Anxiety   . Degenerative disc disease   . Degenerative disc disease, lumbar   . IBS (irritable bowel syndrome)   . Migraine   . Nephrolithiasis   . S/P endoscopy April 2012   Dr. Jena Gauss: friability at GE junction, negative Barrett's orH.pylori, diffuse gastric petechia    Patient Active Problem List   Diagnosis Date Noted  . Ureteral calculus 12/25/2016  . RUQ pain  10/14/2016  . Nausea without vomiting 10/14/2016  . Generalized anxiety disorder 09/10/2016  . Postpartum care following cesarean delivery (12/19) 06/08/2014  . Postoperative state 06/08/2014  . Normal labor 06/06/2014  . Pregnancy 05/17/2014  . Gestational hypertension w/o significant proteinuria in 3rd trimester 05/15/2014  . Acute pyelonephritis 09/11/2012  . GERD (gastroesophageal reflux disease) 09/28/2010  . Epigastric pain 09/28/2010  . Dysphagia 09/28/2010  . IBS (irritable bowel syndrome) 09/28/2010  . Esophageal dysphagia 09/28/2010    Past Surgical History:  Procedure Laterality Date  . CESAREAN SECTION N/A 06/07/2014   Procedure: CESAREAN SECTION;  Surgeon: Genia Del, MD;  Location: [facility];  Service: Obstetrics;  Laterality: N/A;  . CHROMOPERTUBATION Bilateral 04/03/2013   Procedure: CHROMOPERTUBATION with Methylene Blue, with patent right tube and proximal obstruction of left tube;  Surgeon: Genia Del, MD;  Location: [facility];  Service: Gynecology;  Laterality: Bilateral;  . CYSTOSCOPY W/ URETERAL STENT PLACEMENT Left 12/25/2016   Procedure: CYSTOSCOPY WITH LEFT RETROGRADE PYELOGRAM/LEFT STONE EXTRACTION/LEFT URETERAL STENT PLACEMENT;  Surgeon: Marcine Matar, MD;  Location: [facility];  Service: Urology;  Laterality: Left;  . ESOPHAGOGASTRODUODENOSCOPY  09/30/2010   WUJ:WJXBJ hiatal hernia/Friability at the gastroesophageal junction with undulating Z-line versus short segment Barrett esophagus s/p bx  . ileocolonoscopy  12/2007   Friable anal canal.  .  LAPAROSCOPY N/A 04/03/2013   Procedure: LAPAROSCOPY DIAGNOSTIC;  Surgeon: Genia Del, MD;  Location: [facility];  Service: Gynecology;  Laterality: N/A;  . ROBOTIC ASSISTED LAPAROSCOPIC LYSIS OF ADHESION N/A 04/03/2013   Procedure: ROBOTIC ASSISTED LAPAROSCOPIC LYSIS OF ADHESION; LYSIS OF ENDOMETRIOSIS ;  Surgeon: Genia Del, MD;  Location: [facility];  Service: Gynecology;  Laterality: N/A;    OB  History    Gravida Para Term Preterm AB Living   2 1 1   1 1    SAB TAB Ectopic Multiple Live Births   1     0 1       Home Medications    Patient just had a treatment of topical 5-FU for precancerous skin lesions on her face, she also takes metronidazole cream for rosacea otherwise she denies medications  Prior to Admission medications   Medication Sig Start Date End Date Taking? Authorizing Provider  levonorgestrel (MIRENA, 52 MG,) 20 MCG/24HR IUD Mirena 20 mcg/24 hr (5 years) intrauterine device  Take 1 device by intrauterine route.   Yes [provider]  metroNIDAZOLE (METROCREAM) 0.75 % cream metronidazole 0.75 % topical cream-topically daily   Yes [provider]  oxycodone (OXY-IR) 5 MG capsule Take 5 mg by mouth once.   Yes [provider]  fluconazole (DIFLUCAN) 100 MG tablet Take 100 mg by mouth daily. 07/08/17   [provider]  fluorouracil (EFUDEX) 5 % cream fluorouracil 5 % topical cream-topically daily    [provider]    Family History Family History  Problem Relation Age of Onset  . Irritable bowel syndrome Mother   . GI problems Sister        gastroparesis  . Colon cancer Neg Hx   . Inflammatory bowel disease Neg Hx   . Liver disease Neg Hx     Social History Social History   Tobacco Use  . Smoking status: Former Smoker    Packs/day: 1.00    Years: 14.00    Pack years: 14.00    Types: Cigarettes    Start date: 03/08/1996    Last attempt to quit: 09/25/2013    Years since quitting: 3.8  . Smokeless tobacco: Never Used  Substance Use Topics  . Alcohol use: No    Alcohol/week: 0.0 oz  . Drug use: No  works for [employer]   Allergies   Dilaudid [hydromorphone hcl]   Review of Systems Review of Systems  All other systems reviewed and are negative.    Physical Exam Updated Vital Signs BP 114/85   Pulse 77   Temp 98.5 F (36.9 C)   Resp 18   Ht 5\' 3"  (1.6 m)   Wt 93.9 kg (207 lb)    SpO2 98%   BMI 36.67 kg/m   Vital signs normal    Physical Exam  Constitutional: She is oriented to person, place, and time. She appears well-developed and well-nourished.  Non-toxic appearance. She does not appear ill. No distress.  HENT:  Head: Normocephalic and atraumatic.  Right Ear: External ear normal.  Left Ear: External ear normal.  Nose: Nose normal. No mucosal edema or rhinorrhea.  Mouth/Throat: Oropharynx is clear and moist and mucous membranes are normal. No dental abscesses or uvula swelling.  Eyes: Conjunctivae and EOM are normal. Pupils are equal, round, and reactive to light.  Neck: Normal range of motion and full passive range of motion without pain. Neck supple.  Cardiovascular: Normal rate, regular rhythm and normal heart sounds. Exam reveals no  gallop and no friction rub.  No murmur heard. Pulmonary/Chest: Effort normal and breath sounds normal. No respiratory distress. She has no wheezes. She has no rhonchi. She has no rales. She exhibits no tenderness and no crepitus.  Abdominal: Soft. Normal appearance and bowel sounds are normal. She exhibits no distension. There is tenderness in the right upper quadrant. There is no rebound and no guarding.    No CVAT  Musculoskeletal: Normal range of motion. She exhibits no edema or tenderness.  Moves all extremities well.   Neurological: She is alert and oriented to person, place, and time. She has normal strength. No cranial nerve deficit.  Skin: Skin is warm, dry and intact. No rash noted. No erythema. No pallor.  Patient has some reddened areas on her face consistent with recent 5-FU treatment.  Psychiatric: She has a normal mood and affect. Her speech is normal and behavior is normal. Her mood appears not anxious.  Nursing note and vitals reviewed.    ED Treatments / Results  Labs (all labs ordered are listed, but only abnormal results are displayed) Results for orders placed or performed during the hospital  encounter of 07/18/17  Comprehensive metabolic panel  Result Value Ref Range   Sodium 138 135 - 145 mmol/L   Potassium 3.4 (L) 3.5 - 5.1 mmol/L   Chloride 104 101 - 111 mmol/L   CO2 24 22 - 32 mmol/L   Glucose, Bld 100 (H) 65 - 99 mg/dL   BUN 12 6 - 20 mg/dL   Creatinine, Ser 0.450.71 0.44 - 1.00 mg/dL   Calcium 9.4 8.9 - 40.910.3 mg/dL   Total Protein 7.5 6.5 - 8.1 g/dL   Albumin 4.0 3.5 - 5.0 g/dL   AST 20 15 - 41 U/L   ALT 15 14 - 54 U/L   Alkaline Phosphatase 88 38 - 126 U/L   Total Bilirubin 0.3 0.3 - 1.2 mg/dL   GFR calc non Af Amer >60 >60 mL/min   GFR calc Af Amer >60 >60 mL/min   Anion gap 10 5 - 15  Lipase, blood  Result Value Ref Range   Lipase 33 11 - 51 U/L  CBC with Diff  Result Value Ref Range   WBC 10.3 4.0 - 10.5 K/uL   RBC 4.16 3.87 - 5.11 MIL/uL   Hemoglobin 12.6 12.0 - 15.0 g/dL   HCT 81.138.1 91.436.0 - 78.246.0 %   MCV 91.6 78.0 - 100.0 fL   MCH 30.3 26.0 - 34.0 pg   MCHC 33.1 30.0 - 36.0 g/dL   RDW 95.612.9 21.311.5 - 08.615.5 %   Platelets 335 150 - 400 K/uL   Neutrophils Relative % 61 %   Neutro Abs 6.1 1.7 - 7.7 K/uL   Lymphocytes Relative 29 %   Lymphs Abs 3.0 0.7 - 4.0 K/uL   Monocytes Relative 7 %   Monocytes Absolute 0.7 0.1 - 1.0 K/uL   Eosinophils Relative 3 %   Eosinophils Absolute 0.4 0.0 - 0.7 K/uL   Basophils Relative 0 %   Basophils Absolute 0.0 0.0 - 0.1 K/uL  Urinalysis, Routine w reflex microscopic  Result Value Ref Range   Color, Urine YELLOW YELLOW   APPearance CLOUDY (A) CLEAR   Specific Gravity, Urine 1.018 1.005 - 1.030   pH 6.0 5.0 - 8.0   Glucose, UA NEGATIVE NEGATIVE mg/dL   Hgb urine dipstick NEGATIVE NEGATIVE   Bilirubin Urine NEGATIVE NEGATIVE   Ketones, ur NEGATIVE NEGATIVE mg/dL   Protein, ur NEGATIVE NEGATIVE  mg/dL   Nitrite NEGATIVE NEGATIVE   Leukocytes, UA SMALL (A) NEGATIVE   RBC / HPF 0-5 0 - 5 RBC/hpf   WBC, UA 0-5 0 - 5 WBC/hpf   Bacteria, UA RARE (A) NONE SEEN   Squamous Epithelial / LPF 6-30 (A) NONE SEEN   Mucus PRESENT     Laboratory interpretation all normal except mild hypokalemia which she has had before, and contaminated urinalysis which is normal    EKG  EKG Interpretation None       Radiology No results found.  Procedures Procedures (including critical care time)  Medications Ordered in ED Medications  dicyclomine (BENTYL) injection 20 mg (20 mg Intramuscular Given 07/18/17 2328)     Initial Impression / Assessment and Plan / ED Course  I have reviewed the triage vital signs and the nursing notes.  Pertinent labs & imaging results that were available during my care of the patient were reviewed by me and considered in my medical decision making (see chart for details).      Patient was given Bentyl to see if that would help her pain.  Patient was reassessed at 12:45 AM.  She states she does not feel like the Bentyl has done anything to her pain.  We talked more about her symptoms.  I suspect may be her pain is from her IBS.  She states she used to have IBS with diarrhea.  At this point I do not feel like her kidney stones are causing her pain.  She states her gastroenterologist feels like it might be her gallbladder and even though her tests were normal she may improve with a cholecystectomy.  At this point I feel like I do not have any other testing to offer this patient.  She has had all the routine testing that could be done from the emergency department.  She had a recent CT scan done without acute findings.  She was encouraged to follow-up with her gastroenterologist, she states however they said the next available appointment would not be until March. She states the protonix helped with her GERD but not this pain.    Review of her imaging studies show she had a CT renal done on January 15 which showed 2 small renal stones without ureteral stones.  In July 2018 she had a mildly obstructive 3 mm left UVJ stone which cause moderate left hydroureter and mild left hydronephrosis.  She had a  stent placed on July 7.  She had a ultrasound of the right upper quadrant done in May 2018 which was read as normal.  She had a nuclear medicine hepatobiliary scan done in April 2018 which showed rapid function of the gallbladder, with patent cystic duct and common hepatic duct.  No evidence of acute cholecystitis.  At that time her PCP started her on Protonix.  He also referred her to gastroenterology.  Patient has CT scan of the abdomen and pelvis on September 14, 2016.  That showed  a moderately distended gallbladder with borderline thickened gallbladder wall.   Review of the [place] shows patient got 6 prescriptions filled in 2018 for oxycodone x4, hydrocodone x1, and alprazolam x1.  The last was December 30, 2016.  These were from 6 providers including her PCP, her urologist, and the ED.  Final Clinical Impressions(s) / ED Diagnoses   Final diagnoses:  RUQ pain    ED Discharge Orders    None      Plan discharge  Devoria Albe, MD, Armando Gang  Devoria Albe, MD 07/19/17 [phone]

## 2017-07-18 NOTE — ED Triage Notes (Signed)
Patient reports recurrent R flank and abdominal pain that started yesterday. Patient states she has an inflamed gallbladder but hasn't been able to get insurance clearance for tests. Patient reports she has a history of kidney stones but pain is not the same. Patient reports pain since yesterday.

## 2017-07-19 NOTE — Discharge Instructions (Signed)
Please call Dr Luvenia Starchourk's office to see if they can see your sooner than March.  Recheck if you get a fever, have uncontrolled vomiting or seem worse.

## 2017-08-23 ENCOUNTER — Ambulatory Visit: Payer: 59 | Admitting: Internal Medicine

## 2018-01-10 ENCOUNTER — Other Ambulatory Visit (HOSPITAL_COMMUNITY): Payer: Self-pay | Admitting: Neurosurgery

## 2018-01-10 DIAGNOSIS — M5417 Radiculopathy, lumbosacral region: Secondary | ICD-10-CM

## 2018-01-18 ENCOUNTER — Ambulatory Visit (HOSPITAL_COMMUNITY)
Admission: RE | Admit: 2018-01-18 | Discharge: 2018-01-18 | Disposition: A | Discharge status: Home / Self Care | Payer: 59 | Source: Ambulatory Visit | Attending: Neurosurgery | Admitting: Neurosurgery

## 2018-01-18 DIAGNOSIS — N83202 Unspecified ovarian cyst, left side: Secondary | ICD-10-CM | POA: Diagnosis not present

## 2018-01-18 DIAGNOSIS — M5136 Other intervertebral disc degeneration, lumbar region: Secondary | ICD-10-CM | POA: Diagnosis not present

## 2018-01-18 DIAGNOSIS — M5127 Other intervertebral disc displacement, lumbosacral region: Secondary | ICD-10-CM | POA: Insufficient documentation

## 2018-01-18 DIAGNOSIS — M5417 Radiculopathy, lumbosacral region: Secondary | ICD-10-CM

## 2018-01-18 DIAGNOSIS — M5137 Other intervertebral disc degeneration, lumbosacral region: Secondary | ICD-10-CM | POA: Diagnosis not present

## 2018-02-04 ENCOUNTER — Encounter: Payer: 59 | Admitting: Podiatry

## 2018-02-08 NOTE — Progress Notes (Signed)
This encounter was created in error - please disregard.

## 2018-02-11 IMAGING — CT CT RENAL STONE PROTOCOL
2 of 4 series · 17 of 46 positions shown, 19 images · non-contrast
Comparison: Multiple exams, including 09/14/2016 and ultrasound
from 11/12/2016

CLINICAL DATA: Left lower abdominal pain starting 2 days ago but
increasing last night.

EXAM:
CT ABDOMEN AND PELVIS WITHOUT CONTRAST
TECHNIQUE: Multidetector CT imaging of the abdomen and pelvis was performed
following the standard protocol without IV contrast.

[Series 2: axial st · axial · 0.81mm/px · z∈[+783,+1228]mm · 14 of 97 slices shown, 16 images]
[im 4/97  soft-tissue]
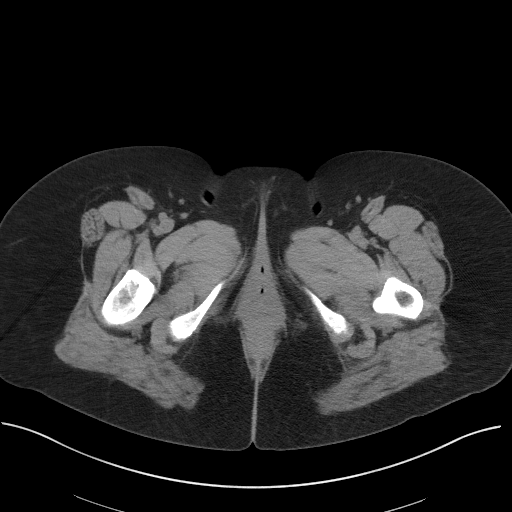
[im 4/97  bone]
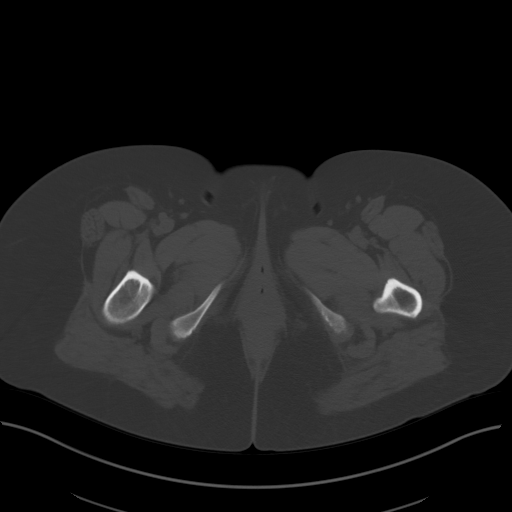
[im 12/97  soft-tissue]
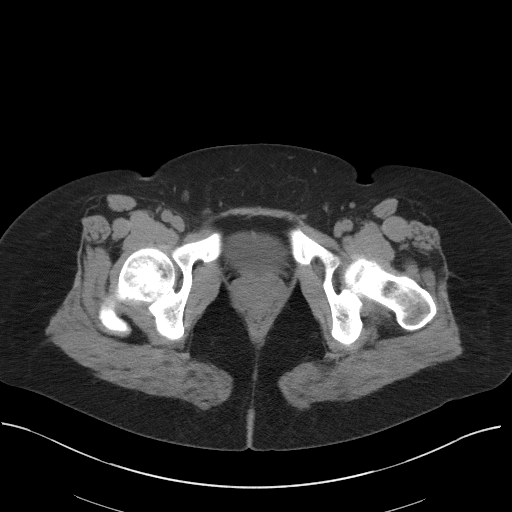
[im 19/97  soft-tissue]
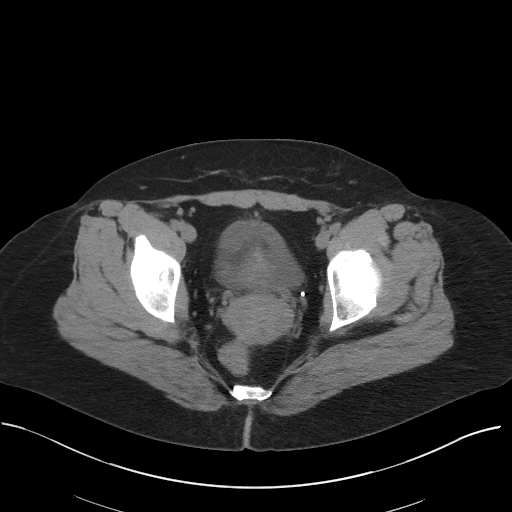
[im 26/97  soft-tissue]
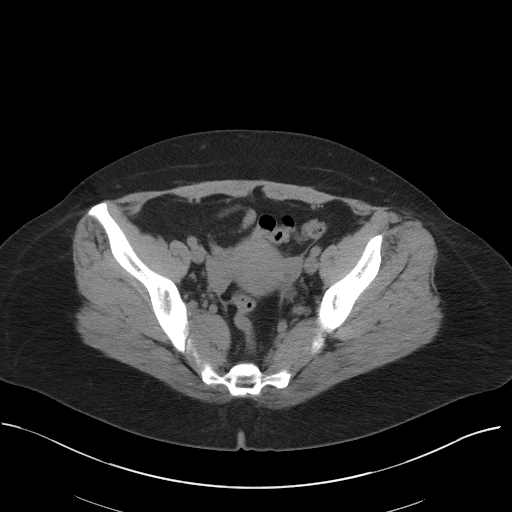
[im 34/97  soft-tissue]
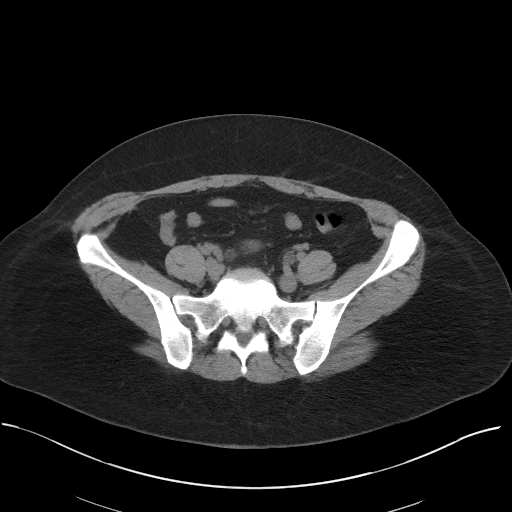
[im 37/97  soft-tissue]
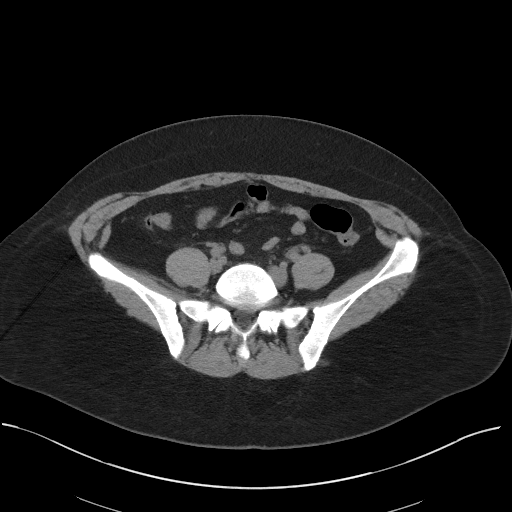
[im 45/97  soft-tissue]
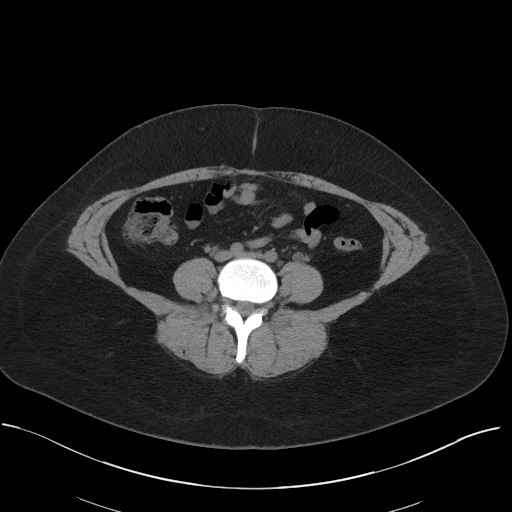
[im 52/97  soft-tissue]
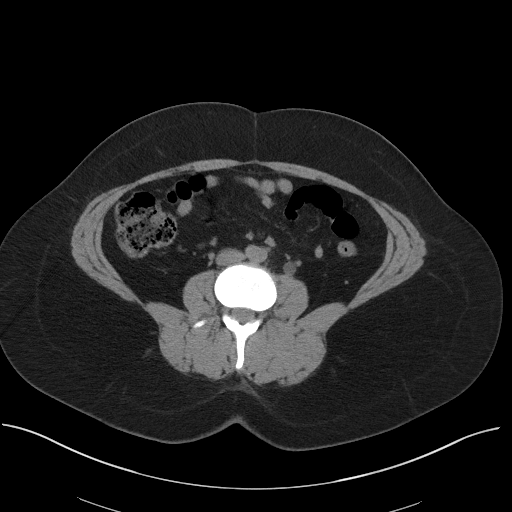
[im 60/97  soft-tissue]
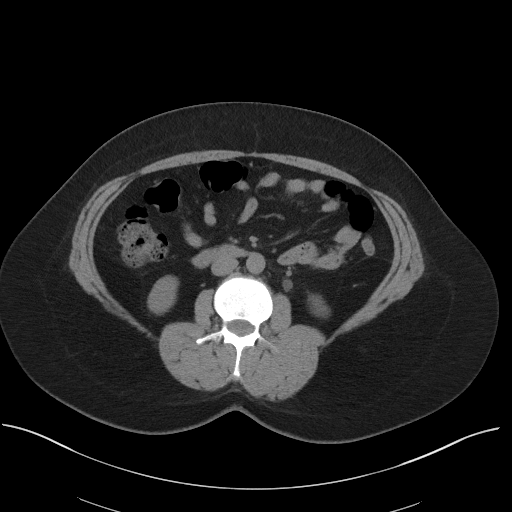
[im 60/97  bone]
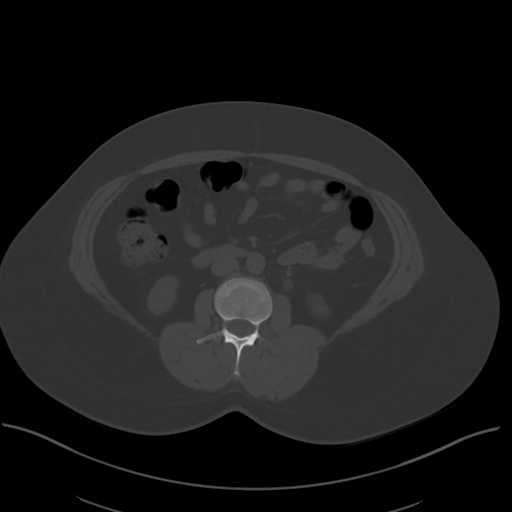
[im 63/97  soft-tissue]
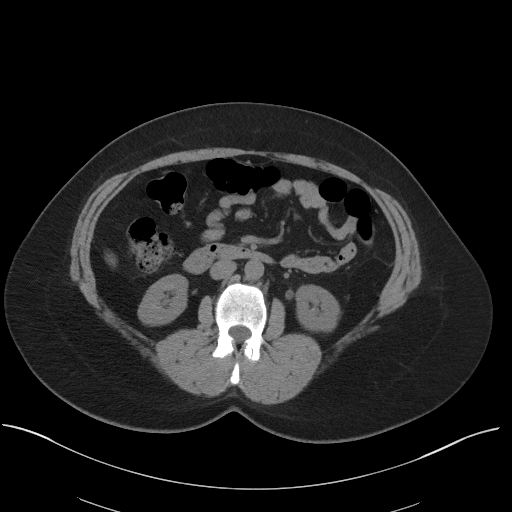
[im 71/97  soft-tissue]
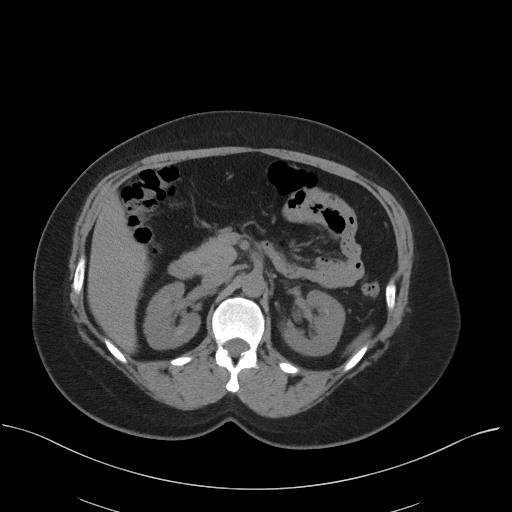
[im 78/97  soft-tissue]
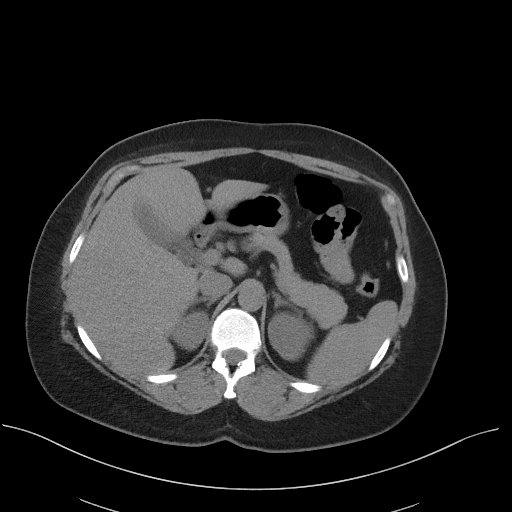
[im 85/97  soft-tissue]
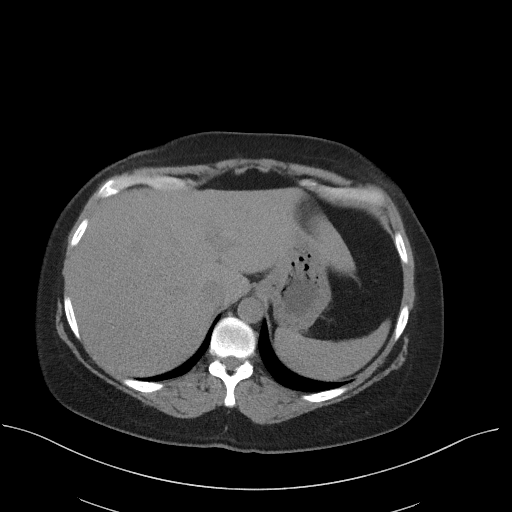
[im 93/97  soft-tissue]
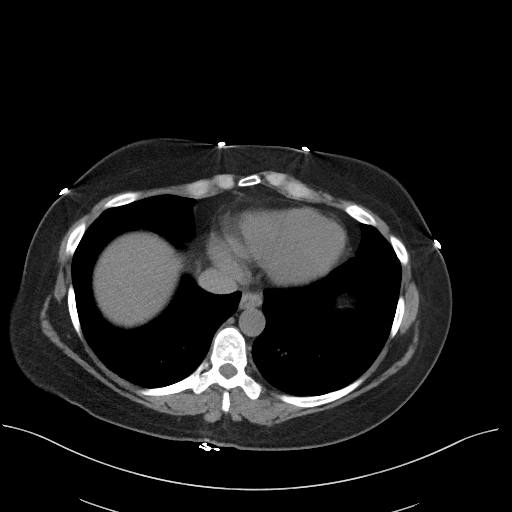

[Series 5: coronal st · coronal · 0.94mm/px · 3 of 83 slices shown]
[im 28/83  soft-tissue]
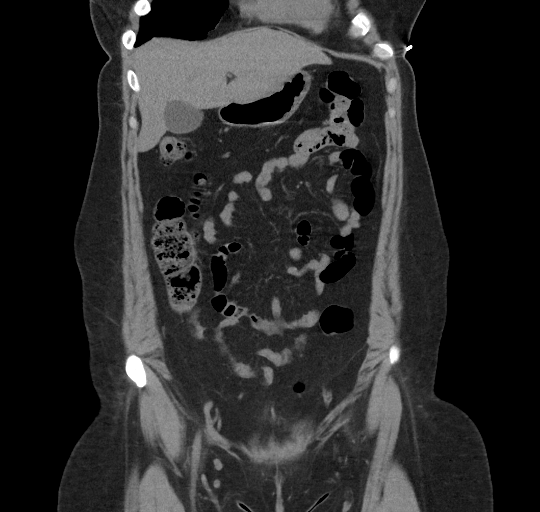
[im 37/83  soft-tissue]
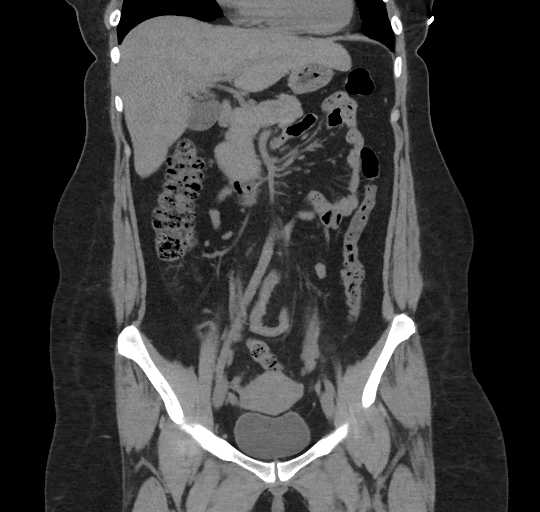
[im 46/83  soft-tissue]
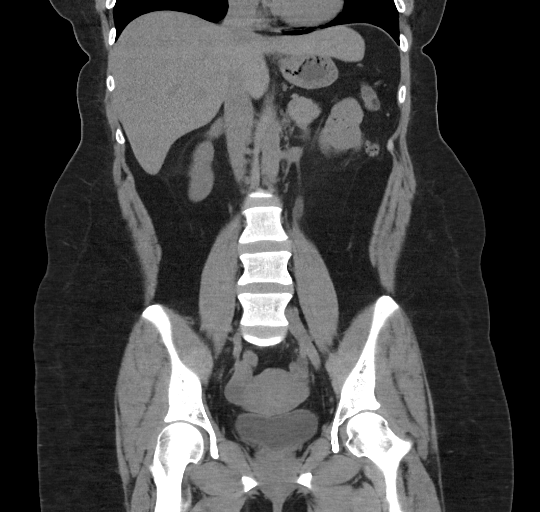

[17 of 46 positions shown; findings below may reference images not displayed]

FINDINGS: Lower chest: Unremarkable

Hepatobiliary: Unremarkable

Pancreas: Unremarkable

Spleen: Unremarkable

Adrenals/Urinary Tract: Mild left hydronephrosis and moderate left
hydroureter extending down to a 3 mm left UVJ stone, image 80/2.
Faintly accentuated density along the medullary pyramids, query mild
medullary nephrocalcinosis.

Stomach/Bowel: Unremarkable

Vascular/Lymphatic: Unremarkable

Reproductive: 2.0 by 1.7 cm fluid density lesion associated with the
right ovary.

Other: No supplemental non-categorized findings.

Musculoskeletal: Unremarkable
IMPRESSION: 1. Mildly obstructive 3 mm left UVJ stone, with moderate left
hydroureter and mild left hydronephrosis.
2. Faint calcification along the medullary pyramids in the kidneys
favoring mild medullary nephrocalcinosis.
3. Small cyst or follicle of the right ovary.

## 2018-02-15 IMAGING — DX DG ABDOMEN 1V
3 series · 3 of 3 positions shown · non-contrast
Comparison: CT 12/23/2016

CLINICAL DATA: Left ureteral stent

EXAM:
ABDOMEN - 1 VIEW

[abdomen kub (1 of 3)]
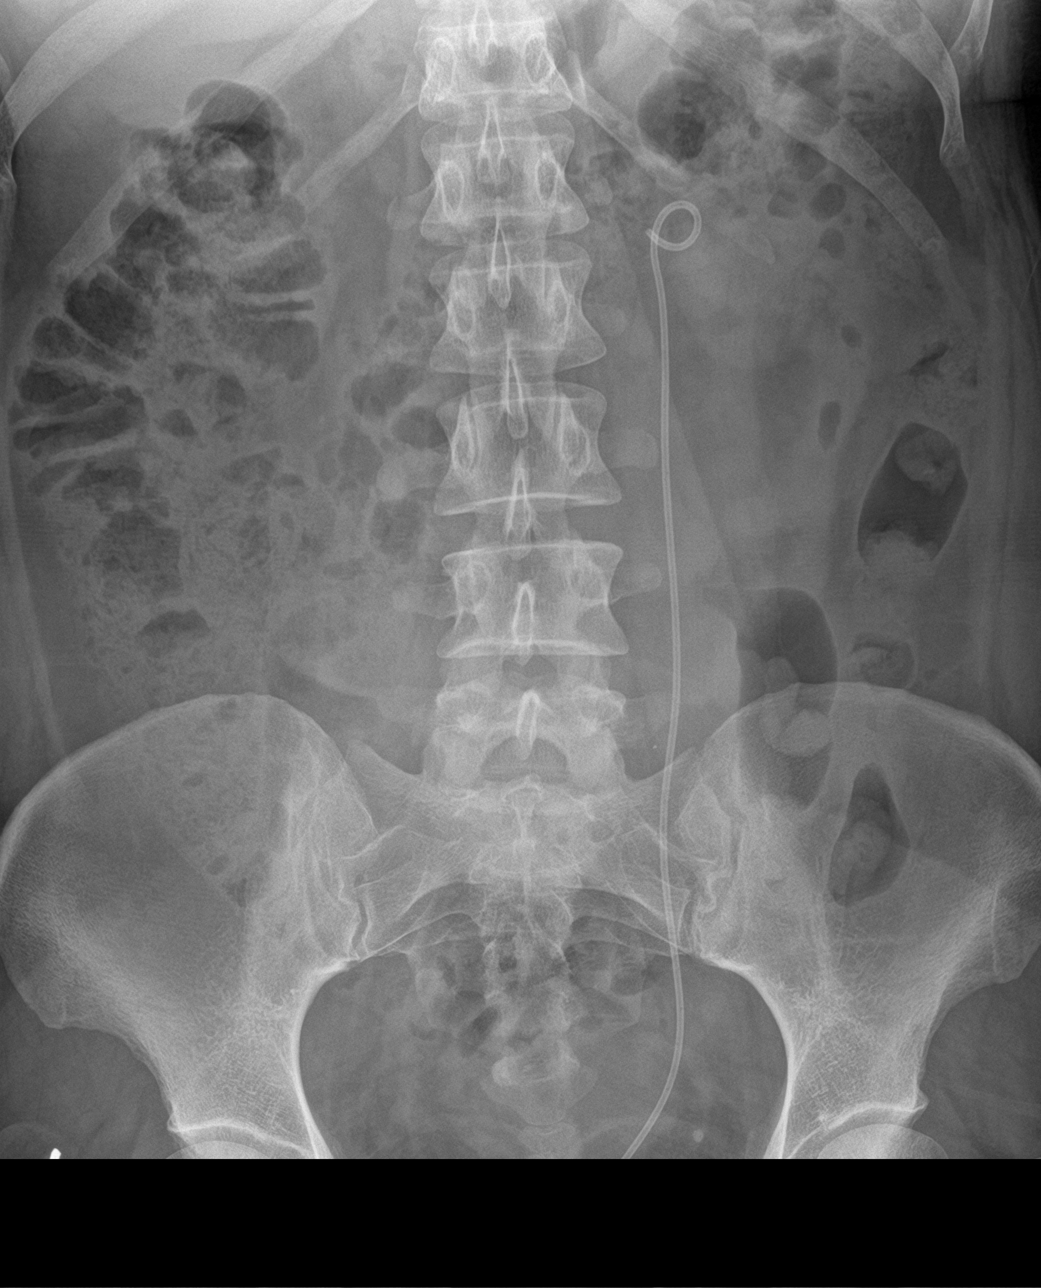

[abdomen kub (2 of 3)]
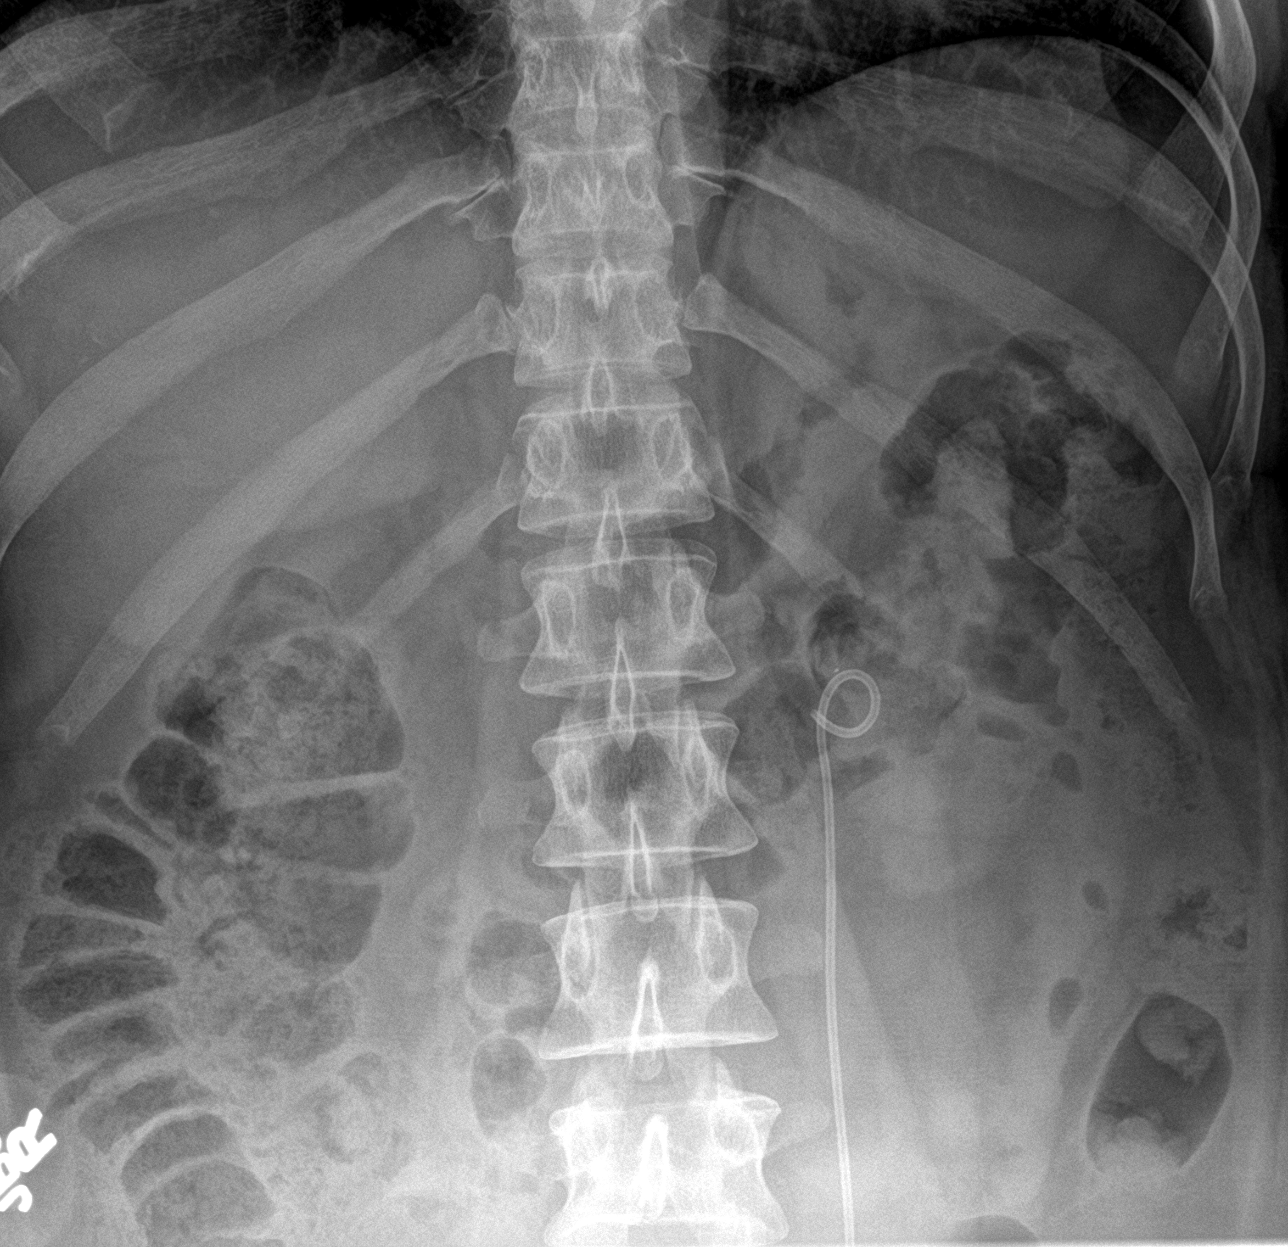

[abdomen kub (3 of 3)]
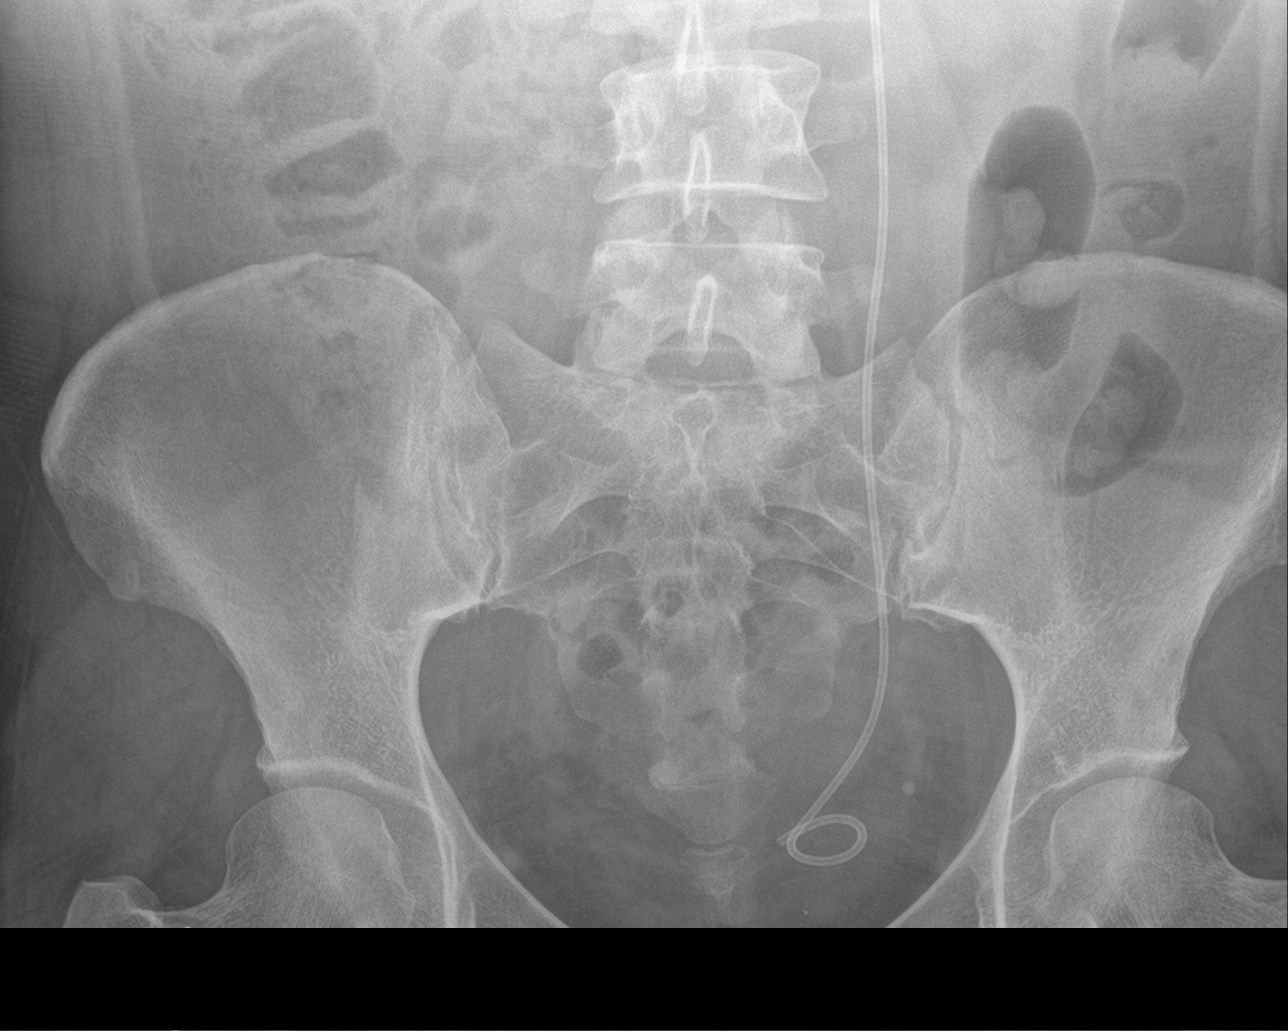

[3 of 3 positions shown; findings below may reference images not displayed]

FINDINGS: Left ureteral stent in place. No visible ureteral stones.
Nonobstructive bowel gas pattern. No free air organomegaly.
IMPRESSION: Left ureteral stent.  No visible stones.

No acute findings.

## 2019-04-16 ENCOUNTER — Encounter: Payer: Self-pay | Admitting: Gastroenterology

## 2019-04-25 ENCOUNTER — Other Ambulatory Visit (INDEPENDENT_AMBULATORY_CARE_PROVIDER_SITE_OTHER): Payer: 59

## 2019-04-25 ENCOUNTER — Ambulatory Visit (INDEPENDENT_AMBULATORY_CARE_PROVIDER_SITE_OTHER): Payer: 59 | Admitting: Gastroenterology

## 2019-04-25 ENCOUNTER — Other Ambulatory Visit: Payer: Self-pay

## 2019-04-25 ENCOUNTER — Encounter: Payer: Self-pay | Admitting: Gastroenterology

## 2019-04-25 VITALS — BP 112/78 | HR 76 | Temp 98.0°F | Ht 63.0 in | Wt 202.6 lb

## 2019-04-25 DIAGNOSIS — R1011 Right upper quadrant pain: Secondary | ICD-10-CM

## 2019-04-25 DIAGNOSIS — Z1159 Encounter for screening for other viral diseases: Secondary | ICD-10-CM | POA: Diagnosis not present

## 2019-04-25 DIAGNOSIS — R1013 Epigastric pain: Secondary | ICD-10-CM

## 2019-04-25 LAB — HEPATIC FUNCTION PANEL
ALT: 16 U/L (ref 0–35)
AST: 15 U/L (ref 0–37)
Albumin: 4.6 g/dL (ref 3.5–5.2)
Alkaline Phosphatase: 100 U/L (ref 39–117)
Bilirubin, Direct: 0.1 mg/dL (ref 0.0–0.3)
Total Bilirubin: 0.6 mg/dL (ref 0.2–1.2)
Total Protein: 7.6 g/dL (ref 6.0–8.3)

## 2019-04-25 NOTE — Patient Instructions (Signed)
If you are age 43 or older, your body mass index should be between 23-30. Your Body mass index is 35.89 kg/m. If this is out of the aforementioned range listed, please consider follow up with your Primary Care Provider.  If you are age 69 or younger, your body mass index should be between 19-25. Your Body mass index is 35.89 kg/m. If this is out of the aformentioned range listed, please consider follow up with your Primary Care Provider.   You have been scheduled for an endoscopy. Please follow written instructions given to you at your visit today. If you use inhalers (even only as needed), please bring them with you on the day of your procedure. Your physician has requested that you go to www.startemmi.com and enter the access code given to you at your visit today. This web site gives a general overview about your procedure. However, you should still follow specific instructions given to you by our office regarding your preparation for the procedure.  Your provider has requested that you go to the basement level for lab work before leaving today. Press "B" on the elevator. The lab is located at the first door on the left as you exit the elevator.  You have been scheduled for an abdominal ultrasound at [facility] (1st floor of hospital) on 05/04/19 at 10:30 am. Please arrive 15 minutes prior to your appointment for registration. Make certain not to have anything to eat or drink after midnight the day prior to your appointment. Should you need to reschedule your appointment, please contact radiology at [phone]. This test typically takes about 30 minutes to perform.  Thank you for choosing me and [facility].   Alonza Bogus, PA-C

## 2019-04-25 NOTE — Progress Notes (Signed)
04/25/2019 Brianna Davis [id number] April 03, 1976   HISTORY OF PRESENT ILLNESS: This is a 43 year old female who is new to our office.  She is here today as a self-referral.  She was a previously patient of Dr. Luvenia Starch.  Anyway, she comes here today with complaints of right upper quadrant and epigastric abdominal pain that radiates to the right side of her back.  This is episodic and associated with nausea and vomiting.  It tends to come after eating.  Overall has been present for about 2.5 years.  She actually had CT scan, ultrasound, HIDA scan all back in the spring 2018 to evaluate for gallbladder disease, but ultimately is unremarkable.  Symptoms have become much more frequent recently.  He says that even eating Jell-O recently seems to bring on the symptoms.  She denies any heartburn or reflux type symptoms.  She says that she had some issues with reflux in the past and was on medication for a while, but has not experienced any of this type of symptoms recently.  Denies NSAID use.   Past Medical History:  Diagnosis Date  . Anxiety   . Degenerative disc disease   . Degenerative disc disease, lumbar   . IBS (irritable bowel syndrome)   . Migraine   . Nephrolithiasis   . S/P endoscopy April 2012   Dr. Jena Gauss: friability at GE junction, negative Barrett's orH.pylori, diffuse gastric petechia   Past Surgical History:  Procedure Laterality Date  . CESAREAN SECTION N/A 06/07/2014   Procedure: CESAREAN SECTION;  Surgeon: Genia Del, MD;  Location: [facility];  Service: Obstetrics;  Laterality: N/A;  . CHROMOPERTUBATION Bilateral 04/03/2013   Procedure: CHROMOPERTUBATION with Methylene Blue, with patent right tube and proximal obstruction of left tube;  Surgeon: Genia Del, MD;  Location: [facility];  Service: Gynecology;  Laterality: Bilateral;  . CYSTOSCOPY W/ URETERAL STENT PLACEMENT Left 12/25/2016   Procedure: CYSTOSCOPY WITH LEFT RETROGRADE PYELOGRAM/LEFT STONE EXTRACTION/LEFT  URETERAL STENT PLACEMENT;  Surgeon: Marcine Matar, MD;  Location: [facility];  Service: Urology;  Laterality: Left;  . ESOPHAGOGASTRODUODENOSCOPY  09/30/2010   VFI:EPPIR hiatal hernia/Friability at the gastroesophageal junction with undulating Z-line versus short segment Barrett esophagus s/p bx  . ileocolonoscopy  12/2007   Friable anal canal.  . LAPAROSCOPY N/A 04/03/2013   Procedure: LAPAROSCOPY DIAGNOSTIC;  Surgeon: Genia Del, MD;  Location: [facility];  Service: Gynecology;  Laterality: N/A;  . ROBOTIC ASSISTED LAPAROSCOPIC LYSIS OF ADHESION N/A 04/03/2013   Procedure: ROBOTIC ASSISTED LAPAROSCOPIC LYSIS OF ADHESION; LYSIS OF ENDOMETRIOSIS ;  Surgeon: Genia Del, MD;  Location: [facility];  Service: Gynecology;  Laterality: N/A;    reports that she quit smoking about 5 years ago. Her smoking use included cigarettes. She started smoking about 23 years ago. She has a 14.00 pack-year smoking history. She has never used smokeless tobacco. She reports that she does not drink alcohol or use drugs. family history includes GI problems in her sister; Irritable bowel syndrome in her mother. Allergies  Allergen Reactions  . Dilaudid [Hydromorphone Hcl] Nausea And Vomiting    Severe nausea and vomiting      Outpatient Encounter Medications as of 04/25/2019  Medication Sig  . gabapentin (NEURONTIN) 300 MG capsule Take 1 capsule by mouth as needed.  Marland Kitchen levonorgestrel (MIRENA, 52 MG,) 20 MCG/24HR IUD Mirena 20 mcg/24 hr (5 years) intrauterine device  Take 1 device by intrauterine route.  . [DISCONTINUED] albuterol (PROVENTIL) (2.5 MG/3ML) 0.083% nebulizer solution albuterol sulfate 2.5 mg/3 mL (0.083 %)  solution for nebulization  VVN Q 4 H PRF WHZ  . [DISCONTINUED] azithromycin (ZITHROMAX) 250 MG tablet azithromycin 250 mg tablet  ZPK  . [DISCONTINUED] benzonatate (TESSALON) 100 MG capsule benzonatate 100 mg capsule  . [DISCONTINUED] fluconazole (DIFLUCAN) 100 MG tablet Take 100 mg by mouth  daily.  . [DISCONTINUED] fluorouracil (EFUDEX) 5 % cream fluorouracil 5 % topical cream-topically daily  . [DISCONTINUED] metroNIDAZOLE (METROCREAM) 0.75 % cream metronidazole 0.75 % topical cream-topically daily  . [DISCONTINUED] oxycodone (OXY-IR) 5 MG capsule Take 5 mg by mouth once.  . [DISCONTINUED] predniSONE (DELTASONE) 5 MG tablet prednisone 5 mg tablets in a dose pack   No facility-administered encounter medications on file as of 04/25/2019.      REVIEW OF SYSTEMS  : All other systems reviewed and negative except where noted in the History of Present Illness.   PHYSICAL EXAM: BP 112/78   Pulse 76   Temp 98 F (36.7 C)   Ht 5\' 3"  (1.6 m)   Wt 202 lb 9.6 oz (91.9 kg)   BMI 35.89 kg/m  General: Well developed white female in no acute distress Head: Normocephalic and atraumatic Eyes:  Sclerae anicteric, conjunctiva pink. Ears: Normal auditory acuity Lungs: Clear throughout to auscultation; no increased WOB. Heart: Regular rate and rhythm; no M/R/G. Abdomen: Soft, non-distended. Normal bowel sounds.  RUQ and epigastric TTP. Musculoskeletal: Symmetrical with no gross deformities  Skin: No lesions on visible extremities Extremities: No edema  Neurological: Alert oriented x 4, grossly non-focal Psychological:  Alert and cooperative. Normal mood and affect  ASSESSMENT AND PLAN: *43 year old female with complaints of episodic right upper quadrant and epigastric abdominal pain that goes to the right side of her back.  Has been experiencing these symptoms intermittently for about 2-1/2 years and previously had negative gallbladder evaluation.  Symptoms have become much more frequent in the last months and weeks.  Symptoms are associated with episodes of nausea vomiting.  Certainly sounds biliary in nature.  I think we are kind of back to square one with working things up again since it has been 2.5 years since any type of evaluation.  We will start by checking hepatic function panel  today.  We will plan for abdominal ultrasound as well as EGD with Dr. Ardis Hughs.  EGD to rule out ulcer disease, etc.   **The risks, benefits, and alternatives to EGD were discussed with the patient and she consents to proceed.   CC:  No ref. provider found

## 2019-04-26 NOTE — Progress Notes (Signed)
I agree.  I US shows stones in her GB then I think the EGD can be cancelled given the higher suspicion for biliary pain.

## 2019-05-02 ENCOUNTER — Telehealth: Payer: Self-pay | Admitting: Gastroenterology

## 2019-05-02 NOTE — Telephone Encounter (Signed)
The pt says her son works with someone that was exposed to [employer].  Her son was tested on Monday and she is waiting for results.  She has COVID test on Friday and [place] also on FRI and EGD on 11/17.  She will leave everything scheduled as is and call back if the test is positive or no result is known by tomorrow afternoon.

## 2019-05-03 NOTE — Telephone Encounter (Signed)
The pt has been rescheduled for [place] and COVID testing for 11/20 at 1030 ang 12 noon.  The pt has been advised.

## 2019-05-03 NOTE — Telephone Encounter (Signed)
Patient called to reschedule her endo because her sister is having to test for covid and can no longer keep her daughter right now. I had rescheduled it but she also needs to reschedule her COVID test and her ultrasound. Patient is asking if there is a way she can have her covid test and ultrasound done in [place] where she lives because she is having trouble with someone watching her kids right now. Her sons covid results have not came back yet.

## 2019-05-04 ENCOUNTER — Encounter: Payer: Self-pay | Admitting: Gastroenterology

## 2019-05-04 ENCOUNTER — Ambulatory Visit (HOSPITAL_COMMUNITY): Payer: 59

## 2019-05-08 ENCOUNTER — Encounter: Payer: 59 | Admitting: Gastroenterology

## 2019-05-11 ENCOUNTER — Ambulatory Visit (HOSPITAL_COMMUNITY)
Admission: RE | Admit: 2019-05-11 | Discharge: 2019-05-11 | Disposition: A | Discharge status: Home / Self Care | Payer: 59 | Source: Ambulatory Visit | Attending: Gastroenterology | Admitting: Gastroenterology

## 2019-05-11 ENCOUNTER — Other Ambulatory Visit: Payer: Self-pay | Admitting: Gastroenterology

## 2019-05-11 ENCOUNTER — Other Ambulatory Visit: Payer: Self-pay

## 2019-05-11 ENCOUNTER — Ambulatory Visit (INDEPENDENT_AMBULATORY_CARE_PROVIDER_SITE_OTHER): Payer: 59

## 2019-05-11 DIAGNOSIS — R1013 Epigastric pain: Secondary | ICD-10-CM | POA: Diagnosis present

## 2019-05-11 DIAGNOSIS — Z1159 Encounter for screening for other viral diseases: Secondary | ICD-10-CM

## 2019-05-11 DIAGNOSIS — R1011 Right upper quadrant pain: Secondary | ICD-10-CM | POA: Insufficient documentation

## 2019-05-14 ENCOUNTER — Ambulatory Visit (AMBULATORY_SURGERY_CENTER): Payer: 59 | Admitting: Gastroenterology

## 2019-05-14 ENCOUNTER — Encounter: Payer: Self-pay | Admitting: Gastroenterology

## 2019-05-14 ENCOUNTER — Other Ambulatory Visit: Payer: Self-pay

## 2019-05-14 VITALS — BP 128/81 | HR 85 | Temp 97.9°F | Resp 15 | Ht 63.0 in | Wt 202.0 lb

## 2019-05-14 DIAGNOSIS — K2970 Gastritis, unspecified, without bleeding: Secondary | ICD-10-CM | POA: Diagnosis not present

## 2019-05-14 DIAGNOSIS — R1011 Right upper quadrant pain: Secondary | ICD-10-CM | POA: Diagnosis not present

## 2019-05-14 LAB — SARS CORONAVIRUS 2 (TAT 6-24 HRS): SARS Coronavirus 2: NEGATIVE

## 2019-05-14 MED ORDER — SODIUM CHLORIDE 0.9 % IV SOLN
500.0000 mL | Freq: Once | INTRAVENOUS | Status: DC
Start: 2019-05-14 — End: 2019-05-14

## 2019-05-14 NOTE — Op Note (Signed)
[facility] Patient Name: Brianna Davis Procedure Date: 05/14/2019 3:31 PM MRN: [id number] Endoscopist: Rachael Fee , MD Age: 43 Referring MD:  Date of Birth: [birth year] Gender: Female Account #: [id number] Procedure:                Upper GI endoscopy Indications:              Abdominal pain in the right upper quadrant                            radiating to right upper back, [place] 04/2019 shows no                            gallstones Medicines:                Monitored Anesthesia Care Procedure:                Pre-Anesthesia Assessment:                           - Prior to the procedure, a History and Physical                            was performed, and patient medications and                            allergies were reviewed. The patient's tolerance of                            previous anesthesia was also reviewed. The risks                            and benefits of the procedure and the sedation                            options and risks were discussed with the patient.                            All questions were answered, and informed consent                            was obtained. Prior Anticoagulants: The patient has                            taken no previous anticoagulant or antiplatelet                            agents. ASA Grade Assessment: II - A patient with                            mild systemic disease. After reviewing the risks                            and benefits, the patient was deemed in  satisfactory condition to undergo the procedure.                           After obtaining informed consent, the endoscope was                            passed under direct vision. Throughout the                            procedure, the patient's blood pressure, pulse, and                            oxygen saturations were monitored continuously. The                            Endoscope was introduced through the mouth,  and                            advanced to the second part of duodenum. The upper                            GI endoscopy was accomplished without difficulty.                            The patient tolerated the procedure well. Scope In: Scope Out: Findings:                 Minimal inflammation characterized by erythema was                            found in the gastric body and in the gastric                            antrum. Biopsies were taken with a cold forceps for                            histology.                           The exam was otherwise without abnormality. Complications:            No immediate complications. Estimated blood loss:                            None. Estimated Blood Loss:     Estimated blood loss: none. Impression:               - Very mild gastritis. Biopsied to check for H.                            pylori.                           - The examination was otherwise normal. Recommendation:           - Patient has a contact number available for  emergencies. The signs and symptoms of potential                            delayed complications were discussed with the                            patient. Return to normal activities tomorrow.                            Written discharge instructions were provided to the                            patient.                           - Resume previous diet.                           - Continue present medications.                           - Await pathology results. If the biopsies do NOT                            show H. pylori, I would like recommend a repeat                            HIDA scan (this time with CCK injection to estimate                            GB ejection fraction formally). Milus Banister, MD 05/14/2019 3:49:02 PM This report has been signed electronically.

## 2019-05-14 NOTE — Progress Notes (Signed)
Report to PACU, RN, vss, BBS= Clear.  

## 2019-05-14 NOTE — Progress Notes (Signed)
VS by CW. Temp by LC. 

## 2019-05-14 NOTE — Progress Notes (Signed)
Called to room to assist during endoscopic procedure.  Patient ID and intended procedure confirmed with present staff. Received instructions for my participation in the procedure from the performing physician.  

## 2019-05-14 NOTE — Patient Instructions (Signed)
Thank you for letting us take care of your healthcare needs today. Please see handouts given to you on Gastritis.    YOU HAD AN ENDOSCOPIC PROCEDURE TODAY AT [facility]:   Refer to the procedure report that was given to you for any specific questions about what was found during the examination.  If the procedure report does not answer your questions, please call your gastroenterologist to clarify.  If you requested that your care partner not be given the details of your procedure findings, then the procedure report has been included in a sealed envelope for you to review at your convenience later.  YOU SHOULD EXPECT: Some feelings of bloating in the abdomen. Passage of more gas than usual.  Walking can help get rid of the air that was put into your GI tract during the procedure and reduce the bloating. If you had a lower endoscopy (such as a colonoscopy or flexible sigmoidoscopy) you may notice spotting of blood in your stool or on the toilet paper. If you underwent a bowel prep for your procedure, you may not have a normal bowel movement for a few days.  Please Note:  You might notice some irritation and congestion in your nose or some drainage.  This is from the oxygen used during your procedure.  There is no need for concern and it should clear up in a day or so.  SYMPTOMS TO REPORT IMMEDIATELY:   Following lower endoscopy (colonoscopy or flexible sigmoidoscopy):  Excessive amounts of blood in the stool  Significant tenderness or worsening of abdominal pains  Swelling of the abdomen that is new, acute  Fever of 100F or higher   Following upper endoscopy (EGD)  Vomiting of blood or coffee ground material  New chest pain or pain under the shoulder blades  Painful or persistently difficult swallowing  New shortness of breath  Fever of 100F or higher  Black, tarry-looking stools  For urgent or emergent issues, a gastroenterologist can be reached at any hour by calling  [phone].   DIET:  We do recommend a small meal at first, but then you may proceed to your regular diet.  Drink plenty of fluids but you should avoid alcoholic beverages for 24 hours.  ACTIVITY:  You should plan to take it easy for the rest of today and you should NOT DRIVE or use heavy machinery until tomorrow (because of the sedation medicines used during the test).    FOLLOW UP: Our staff will call the number listed on your records 48-72 hours following your procedure to check on you and address any questions or concerns that you may have regarding the information given to you following your procedure. If we do not reach you, we will leave a message.  We will attempt to reach you two times.  During this call, we will ask if you have developed any symptoms of COVID 19. If you develop any symptoms (ie: fever, flu-like symptoms, shortness of breath, cough etc.) before then, please call [phone].  If you test positive for Covid 19 in the 2 weeks post procedure, please call and report this information to [place].    If any biopsies were taken you will be contacted by phone or by letter within the next 1-3 weeks.  Please call us at [phone] if you have not heard about the biopsies in 3 weeks.    SIGNATURES/CONFIDENTIALITY: You and/or your care partner have signed paperwork which will be entered into your electronic medical  record.  These signatures attest to the fact that that the information above on your After Visit Summary has been reviewed and is understood.  Full responsibility of the confidentiality of this discharge information lies with you and/or your care-partner. 

## 2019-05-16 ENCOUNTER — Telehealth: Payer: Self-pay

## 2019-05-16 NOTE — Telephone Encounter (Signed)
  Follow up Call-  Call back number 05/14/2019  Post procedure Call Back phone  # [phone]  Permission to leave phone message Yes  Some recent data might be hidden     Patient questions:  Do you have a fever, pain , or abdominal swelling? No. Pain Score  0 *  Have you tolerated food without any problems? Yes.    Have you been able to return to your normal activities? Yes.    Do you have any questions about your discharge instructions: Diet   No. Medications  No. Follow up visit  NO   Do you have questions or concerns about your Care? No.  Actions: * If pain score is 4 or above: No action needed, pain <4.  1. Have you developed a fever since your procedure? no  2.   Have you had an respiratory symptoms (SOB or cough) since your procedure? no  3.   Have you tested positive for COVID 19 since your procedure no  4.   Have you had any family members/close contacts diagnosed with the COVID 19 since your procedure?  no   If yes to any of these questions please route to Joylene John, RN and Alphonsa Gin, Therapist, sports.

## 2019-05-19 ENCOUNTER — Encounter: Payer: Self-pay | Admitting: Gastroenterology

## 2019-11-19 ENCOUNTER — Other Ambulatory Visit: Payer: Self-pay

## 2019-11-19 ENCOUNTER — Ambulatory Visit
Admission: EM | Admit: 2019-11-19 | Discharge: 2019-11-19 | Disposition: A | Discharge status: Home / Self Care | Payer: 59 | Attending: Emergency Medicine | Admitting: Emergency Medicine

## 2019-11-19 DIAGNOSIS — H6591 Unspecified nonsuppurative otitis media, right ear: Secondary | ICD-10-CM | POA: Diagnosis not present

## 2019-11-19 DIAGNOSIS — J208 Acute bronchitis due to other specified organisms: Secondary | ICD-10-CM | POA: Diagnosis not present

## 2019-11-19 DIAGNOSIS — J9801 Acute bronchospasm: Secondary | ICD-10-CM | POA: Diagnosis not present

## 2019-11-19 MED ORDER — PREDNISONE 10 MG PO TABS: 20.0000 mg | ORAL_TABLET | Freq: Every day | ORAL | 0 refills | Status: DC

## 2019-11-19 MED ORDER — AZITHROMYCIN 250 MG PO TABS: 250.0000 mg | ORAL_TABLET | Freq: Every day | ORAL | 0 refills | Status: DC

## 2019-11-19 MED ORDER — FLUTICASONE PROPIONATE 50 MCG/ACT NA SUSP: 1.0000 | Freq: Every day | NASAL | 0 refills | Status: DC

## 2019-11-19 MED ORDER — CETIRIZINE HCL 10 MG PO TABS: 10.0000 mg | ORAL_TABLET | Freq: Every day | ORAL | 0 refills | Status: DC

## 2019-11-19 MED ORDER — BENZONATATE 100 MG PO CAPS: 100.0000 mg | ORAL_CAPSULE | Freq: Three times a day (TID) | ORAL | 0 refills | Status: DC

## 2019-11-19 NOTE — ED Provider Notes (Signed)
[facility]    CSN: [id number] Arrival date & time: 11/19/19  1014      History   Chief Complaint Chief Complaint  Patient presents with  . Otalgia  . Cough    HPI Brianna Davis is a 44 y.o. female.   Presented to the urgent care for complaint of ear pain, cough, nasal congestion and sinus symptoms for the past few days.  Denies sick exposure to COVID, flu or strep.  Denies recent travel.  Denies aggravating or alleviating symptoms.  Denies previous COVID infection.   Denies fever, chills, fatigue,  rhinorrhea, sore throat,  SOB, wheezing, chest pain, nausea, vomiting, changes in bowel or bladder habits.    The history is provided by the patient. No language interpreter was used.  Otalgia Associated symptoms: congestion and cough   Cough Associated symptoms: ear pain     Past Medical History:  Diagnosis Date  . Anxiety   . Degenerative disc disease   . Degenerative disc disease, lumbar   . IBS (irritable bowel syndrome)   . Migraine   . Nephrolithiasis   . S/P endoscopy April 2012   Dr. Jena Gauss: friability at GE junction, negative Barrett's orH.pylori, diffuse gastric petechia    Patient Active Problem List   Diagnosis Date Noted  . Screening for viral disease 04/25/2019  . Ureteral calculus 12/25/2016  . RUQ abdominal pain 10/14/2016  . Nausea without vomiting 10/14/2016  . Generalized anxiety disorder 09/10/2016  . Postpartum care following cesarean delivery (12/19) 06/08/2014  . Postoperative state 06/08/2014  . Normal labor 06/06/2014  . Pregnancy 05/17/2014  . Gestational hypertension w/o significant proteinuria in 3rd trimester 05/15/2014  . Acute pyelonephritis 09/11/2012  . GERD (gastroesophageal reflux disease) 09/28/2010  . Epigastric pain 09/28/2010  . Dysphagia 09/28/2010  . IBS (irritable bowel syndrome) 09/28/2010  . Esophageal dysphagia 09/28/2010    Past Surgical History:  Procedure Laterality Date  . CESAREAN SECTION N/A  06/07/2014   Procedure: CESAREAN SECTION;  Surgeon: Genia Del, MD;  Location: [facility];  Service: Obstetrics;  Laterality: N/A;  . CHROMOPERTUBATION Bilateral 04/03/2013   Procedure: CHROMOPERTUBATION with Methylene Blue, with patent right tube and proximal obstruction of left tube;  Surgeon: Genia Del, MD;  Location: [facility];  Service: Gynecology;  Laterality: Bilateral;  . CYSTOSCOPY W/ URETERAL STENT PLACEMENT Left 12/25/2016   Procedure: CYSTOSCOPY WITH LEFT RETROGRADE PYELOGRAM/LEFT STONE EXTRACTION/LEFT URETERAL STENT PLACEMENT;  Surgeon: Marcine Matar, MD;  Location: [facility];  Service: Urology;  Laterality: Left;  . ESOPHAGOGASTRODUODENOSCOPY  09/30/2010   WPY:KDXIP hiatal hernia/Friability at the gastroesophageal junction with undulating Z-line versus short segment Barrett esophagus s/p bx  . ileocolonoscopy  12/2007   Friable anal canal.  . LAPAROSCOPY N/A 04/03/2013   Procedure: LAPAROSCOPY DIAGNOSTIC;  Surgeon: Genia Del, MD;  Location: [facility];  Service: Gynecology;  Laterality: N/A;  . ROBOTIC ASSISTED LAPAROSCOPIC LYSIS OF ADHESION N/A 04/03/2013   Procedure: ROBOTIC ASSISTED LAPAROSCOPIC LYSIS OF ADHESION; LYSIS OF ENDOMETRIOSIS ;  Surgeon: Genia Del, MD;  Location: [facility];  Service: Gynecology;  Laterality: N/A;    OB History    Gravida  2   Para  1   Term  1   Preterm      AB  1   Living  1     SAB  1   TAB      Ectopic      Multiple  0   Live Births  1  Home Medications    Prior to Admission medications   Medication Sig Start Date End Date Taking? Authorizing Provider  ALPRAZolam Prudy Feeler) 0.5 MG tablet Take 1 Dose by mouth 2 (two) times daily.    [provider]  azithromycin (ZITHROMAX) 250 MG tablet Take 1 tablet (250 mg total) by mouth daily. Take first 2 tablets together, then 1 every day until finished. 11/19/19   Avegno, Zachery Dakins, FNP  cetirizine (ZYRTEC ALLERGY) 10 MG tablet Take 1 tablet (10  mg total) by mouth daily. 11/19/19   Avegno, Zachery Dakins, FNP  fluticasone (FLONASE) 50 MCG/ACT nasal spray Place 1 spray into both nostrils daily for 14 days. 11/19/19 12/03/19  Avegno, Zachery Dakins, FNP  gabapentin (NEURONTIN) 300 MG capsule Take 1 capsule by mouth as needed.    [provider]  levonorgestrel (MIRENA, 52 MG,) 20 MCG/24HR IUD Mirena 20 mcg/24 hr (5 years) intrauterine device  Take 1 device by intrauterine route.    [provider]  predniSONE (DELTASONE) 10 MG tablet Take 2 tablets (20 mg total) by mouth daily. 11/19/19   AvegnoZachery Dakins, FNP    Family History Family History  Problem Relation Age of Onset  . Irritable bowel syndrome Mother   . GI problems Sister        gastroparesis  . Colon cancer Neg Hx   . Inflammatory bowel disease Neg Hx   . Liver disease Neg Hx     Social History Social History   Tobacco Use  . Smoking status: Former Smoker    Packs/day: 1.00    Years: 14.00    Pack years: 14.00    Types: Cigarettes    Start date: 03/08/1996    Quit date: 09/25/2013    Years since quitting: 6.1  . Smokeless tobacco: Never Used  Substance Use Topics  . Alcohol use: No    Alcohol/week: 0.0 standard drinks  . Drug use: No     Allergies   Dilaudid [hydromorphone hcl]   Review of Systems Review of Systems  Constitutional: Negative.   HENT: Positive for congestion, ear pain, sinus pressure and sinus pain.   Respiratory: Positive for cough.   Cardiovascular: Negative.   Gastrointestinal: Negative.   Neurological: Negative.   All other systems reviewed and are negative.    Physical Exam Triage Vital Signs ED Triage Vitals  Enc Vitals Group     BP 11/19/19 1120 135/89     Pulse Rate 11/19/19 1120 84     Resp 11/19/19 1120 20     Temp 11/19/19 1120 98.6 F (37 C)     Temp src --      SpO2 11/19/19 1120 97 %     Weight --      Height --      Head Circumference --      Peak Flow --      Pain Score 11/19/19 1114 5     Pain  Loc --      Pain Edu? --      Excl. in GC? --    No data found.  Updated Vital Signs BP 135/89   Pulse 84   Temp 98.6 F (37 C)   Resp 20   SpO2 97%   Visual Acuity Right Eye Distance:   Left Eye Distance:   Bilateral Distance:    Right Eye Near:   Left Eye Near:    Bilateral Near:     Physical Exam Vitals and nursing note reviewed.  Constitutional:  General: She is not in acute distress.    Appearance: Normal appearance. She is normal weight. She is not ill-appearing, toxic-appearing or diaphoretic.  HENT:     Head: Normocephalic.     Right Ear: Ear canal and external ear normal. A middle ear effusion is present. There is no impacted cerumen.     Left Ear: Tympanic membrane, ear canal and external ear normal. There is no impacted cerumen.     Nose: Congestion and rhinorrhea present.     Mouth/Throat:     Mouth: Mucous membranes are moist.     Pharynx: No oropharyngeal exudate or posterior oropharyngeal erythema.  Cardiovascular:     Rate and Rhythm: Normal rate and regular rhythm.     Pulses: Normal pulses.     Heart sounds: Normal heart sounds. No murmur. No friction rub. No gallop.   Pulmonary:     Effort: Pulmonary effort is normal. No respiratory distress.     Breath sounds: No stridor. Wheezing present. No rhonchi or rales.  Chest:     Chest wall: No tenderness.  Neurological:     Mental Status: She is alert.      UC Treatments / Results  Labs (all labs ordered are listed, but only abnormal results are displayed) Labs Reviewed - No data to display  EKG   Radiology No results found.  Procedures Procedures (including critical care time)  Medications Ordered in UC Medications - No data to display  Initial Impression / Assessment and Plan / UC Course  I have reviewed the triage vital signs and the nursing notes.  Pertinent labs & imaging results that were available during my care of the patient were reviewed by me and considered in my  medical decision making (see chart for details).  Patient is stable at discharge.  Zyrtec and Flonase were prescribed for seasonal allergy symptoms.  Prednisone was prescribed for bronchospasm.  Azithromycin for possible bronchitis  Final Clinical Impressions(s) / UC Diagnoses   Final diagnoses:  Acute bronchitis due to other specified organisms  Bronchospasm  Fluid level behind tympanic membrane of right ear     Discharge Instructions     Take medication as prescribed and to completion Follow-up with PCP Return or go to ED for worsening of symptoms    ED Prescriptions    Medication Sig Dispense Auth. Provider   cetirizine (ZYRTEC ALLERGY) 10 MG tablet Take 1 tablet (10 mg total) by mouth daily. 30 tablet Avegno, Komlanvi S, FNP   fluticasone (FLONASE) 50 MCG/ACT nasal spray Place 1 spray into both nostrils daily for 14 days. 16 g Avegno, Darrelyn Hillock, FNP   azithromycin (ZITHROMAX) 250 MG tablet Take 1 tablet (250 mg total) by mouth daily. Take first 2 tablets together, then 1 every day until finished. 6 tablet Avegno, Komlanvi S, FNP   predniSONE (DELTASONE) 10 MG tablet Take 2 tablets (20 mg total) by mouth daily. 15 tablet Avegno, Darrelyn Hillock, FNP     PDMP not reviewed this encounter.   Emerson Monte, FNP 11/19/19 1200

## 2019-11-19 NOTE — Discharge Instructions (Addendum)
Take medication as prescribed and to completion Follow-up with PCP Return or go to ED for worsening of symptoms 

## 2019-11-19 NOTE — ED Triage Notes (Signed)
Pt presents with ear pain and cough for past several days. Pt concerned for bronchitis

## 2020-01-10 ENCOUNTER — Ambulatory Visit: Payer: 59 | Admitting: Psychology

## 2020-01-22 ENCOUNTER — Other Ambulatory Visit: Payer: Self-pay

## 2020-01-22 ENCOUNTER — Ambulatory Visit
Admission: RE | Admit: 2020-01-22 | Discharge: 2020-01-22 | Disposition: A | Discharge status: Home / Self Care | Payer: 59 | Source: Ambulatory Visit | Attending: Orthopaedic Surgery | Admitting: Orthopaedic Surgery

## 2020-01-22 ENCOUNTER — Other Ambulatory Visit: Payer: Self-pay | Admitting: Orthopaedic Surgery

## 2020-01-22 DIAGNOSIS — M25522 Pain in left elbow: Secondary | ICD-10-CM

## 2020-01-23 ENCOUNTER — Other Ambulatory Visit: Payer: Self-pay

## 2020-01-23 ENCOUNTER — Encounter (HOSPITAL_BASED_OUTPATIENT_CLINIC_OR_DEPARTMENT_OTHER): Payer: Self-pay | Admitting: Orthopaedic Surgery

## 2020-01-23 ENCOUNTER — Other Ambulatory Visit (HOSPITAL_COMMUNITY)
Admission: RE | Admit: 2020-01-23 | Discharge: 2020-01-23 | Disposition: A | Discharge status: Home / Self Care | Payer: 59 | Source: Ambulatory Visit | Attending: Orthopaedic Surgery | Admitting: Orthopaedic Surgery

## 2020-01-23 DIAGNOSIS — Z20822 Contact with and (suspected) exposure to covid-19: Secondary | ICD-10-CM | POA: Insufficient documentation

## 2020-01-23 DIAGNOSIS — Z01812 Encounter for preprocedural laboratory examination: Secondary | ICD-10-CM | POA: Diagnosis present

## 2020-01-23 LAB — SARS CORONAVIRUS 2 (TAT 6-24 HRS): SARS Coronavirus 2: NEGATIVE

## 2020-01-23 NOTE — H&P (Signed)
PREOPERATIVE H&P  Chief Complaint: RADIAL NECK FRACTURE, RADIAL HEAD DISLOCATION, LOOSE BODY LEFT ELBOW  HPI: Brianna Davis is a 44 y.o. female who is scheduled for OPEN REDUCTION INTERNAL FIXATION (ORIF) RADIAL HEADFRACTURE, REPAIR LATERAL COLLATERAL LIGAMENT ELBOW WITH LOCAL TISSUE, ARTHROTOMY ELBOW WITH JOINT EXPLORATION WITHOUT BIOPSY WITH REMOVAL OF LOOSE BODY.   Patient has a past medical history significant for IBS, anxiety, migraines.   Brianna Davis is a 44 year old who fell earlier today and had immediate pain in her left elbow, primarily and some in her left shoulder. She feels the shoulder  feels better but she is starting having trouble now moving her left elbow. She has extreme pain when she tries to extend it. She does not have pain in flexion.  No other area of injury.  Her symptoms are rated as moderate to severe, and have been worsening.  This is significantly impairing activities of daily living.    Please see clinic note for further details on this patient's care.    She has elected for surgical management.   Past Medical History:  Diagnosis Date  . Anxiety   . Degenerative disc disease   . Degenerative disc disease, lumbar   . IBS (irritable bowel syndrome)   . Migraine   . Nephrolithiasis   . S/P endoscopy April 2012   Dr. Jena Gauss: friability at GE junction, negative Barrett's orH.pylori, diffuse gastric petechia   Past Surgical History:  Procedure Laterality Date  . CESAREAN SECTION N/A 06/07/2014   Procedure: CESAREAN SECTION;  Surgeon: Genia Del, MD;  Location: [facility];  Service: Obstetrics;  Laterality: N/A;  . CHROMOPERTUBATION Bilateral 04/03/2013   Procedure: CHROMOPERTUBATION with Methylene Blue, with patent right tube and proximal obstruction of left tube;  Surgeon: Genia Del, MD;  Location: [facility];  Service: Gynecology;  Laterality: Bilateral;  . CYSTOSCOPY W/ URETERAL STENT PLACEMENT Left 12/25/2016   Procedure: CYSTOSCOPY WITH  LEFT RETROGRADE PYELOGRAM/LEFT STONE EXTRACTION/LEFT URETERAL STENT PLACEMENT;  Surgeon: Marcine Matar, MD;  Location: [facility];  Service: Urology;  Laterality: Left;  . ESOPHAGOGASTRODUODENOSCOPY  09/30/2010   MBW:GYKZL hiatal hernia/Friability at the gastroesophageal junction with undulating Z-line versus short segment Barrett esophagus s/p bx  . ileocolonoscopy  12/2007   Friable anal canal.  . LAPAROSCOPY N/A 04/03/2013   Procedure: LAPAROSCOPY DIAGNOSTIC;  Surgeon: Genia Del, MD;  Location: [facility];  Service: Gynecology;  Laterality: N/A;  . ROBOTIC ASSISTED LAPAROSCOPIC LYSIS OF ADHESION N/A 04/03/2013   Procedure: ROBOTIC ASSISTED LAPAROSCOPIC LYSIS OF ADHESION; LYSIS OF ENDOMETRIOSIS ;  Surgeon: Genia Del, MD;  Location: [facility];  Service: Gynecology;  Laterality: N/A;   Social History   Socioeconomic History  . Marital status: Married    Spouse name: Not on file  . Number of children: 0  . Years of education: Not on file  . Highest education level: Not on file  Occupational History    Employer: [employer]    Comment: works from home  Tobacco Use  . Smoking status: Former Smoker    Packs/day: 1.00    Years: 14.00    Pack years: 14.00    Types: Cigarettes    Start date: 03/08/1996    Quit date: 09/25/2013    Years since quitting: 6.3  . Smokeless tobacco: Never Used  Vaping Use  . Vaping Use: Never used  Substance and Sexual Activity  . Alcohol use: No    Alcohol/week: 0.0 standard drinks  . Drug use: No  . Sexual activity:  Yes    Partners: Male    Birth control/protection: I.U.D.    Comment: spouse  Other Topics Concern  . Not on file  Social History Narrative  . Not on file   Social Determinants of Health   Financial Resource Strain:   . Difficulty of Paying Living Expenses:   Food Insecurity:   . Worried About Programme researcher, broadcasting/film/video in the Last Year:   . Barista in the Last Year:   Transportation Needs:   . Automotive engineer (Medical):   Marland Kitchen Lack of Transportation (Non-Medical):   Physical Activity:   . Days of Exercise per Week:   . Minutes of Exercise per Session:   Stress:   . Feeling of Stress :   Social Connections:   . Frequency of Communication with Friends and Family:   . Frequency of Social Gatherings with Friends and Family:   . Attends Religious Services:   . Active Member of Clubs or Organizations:   . Attends Banker Meetings:   Marland Kitchen Marital Status:    Family History  Problem Relation Age of Onset  . Irritable bowel syndrome Mother   . GI problems Sister        gastroparesis  . Colon cancer Neg Hx   . Inflammatory bowel disease Neg Hx   . Liver disease Neg Hx    Allergies  Allergen Reactions  . Dilaudid [Hydromorphone Hcl] Nausea And Vomiting    Severe nausea and vomiting   Prior to Admission medications   Medication Sig Start Date End Date Taking? Authorizing Provider  cetirizine (ZYRTEC ALLERGY) 10 MG tablet Take 1 tablet (10 mg total) by mouth daily. 11/19/19  Yes Avegno, Zachery Dakins, FNP  diclofenac (VOLTAREN) 25 MG EC tablet Take 25 mg by mouth 2 (two) times daily.   Yes [provider]  traMADol (ULTRAM) 50 MG tablet Take by mouth every 6 (six) hours as needed.   Yes [provider]  ALPRAZolam (XANAX) 0.5 MG tablet Take 1 Dose by mouth 2 (two) times daily.    [provider]  gabapentin (NEURONTIN) 300 MG capsule Take 1 capsule by mouth as needed.    [provider]  levonorgestrel (MIRENA, 52 MG,) 20 MCG/24HR IUD Mirena 20 mcg/24 hr (5 years) intrauterine device  Take 1 device by intrauterine route.    [provider]    ROS: All other systems have been reviewed and were otherwise negative with the exception of those mentioned in the HPI and as above.  Physical Exam: General: Alert, no acute distress Cardiovascular: No pedal edema Respiratory: No cyanosis, no use of accessory musculature GI: No  organomegaly, abdomen is soft and non-tender Skin: No lesions in the area of chief complaint Neurologic: Sensation intact distally Psychiatric: Patient is competent for consent with normal mood and affect Lymphatic: No axillary or cervical lymphadenopathy  MUSCULOSKELETAL:  Examination of the left elbow demonstrates extreme pain to palpation over the lateral aspect, radial head as well over the lateral triceps insertion.  She has some range of motion from about 60-110 but pronation and supination are extraordinarily painful for her. Warm, well perfused hand.    Imaging: X-rays of the elbow, three views, demonstrate an atypical appearance on the oblique view of the distal humerus. The radial head looks to be normal.  It is a little unclear on these images  if there is a fracture but  they do look atypical.  CT of the left elbow  shows subluxation of her radiocapitellar joint and fractures of the coronoid, radial neck and capitellum with loose bodies  Assessment: RADIAL NECK FRACTURE, RADIAL HEAD DISLOCATION, LOOSE BODY LEFT ELBOW  Plan: Plan for Procedure(s): OPEN REDUCTION INTERNAL FIXATION (ORIF) RADIAL HEADFRACTURE, REPAIR LATERAL COLLATERAL LIGAMENT ELBOW WITH LOCAL TISSUE, ARTHROTOMY ELBOW WITH JOINT EXPLORATION WITHOUT BIOPSY WITH REMOVAL OF LOOSE BODY  The risks benefits and alternatives were discussed with the patient including but not limited to the risks of nonoperative treatment, versus surgical intervention including infection, bleeding, nerve injury,  blood clots, cardiopulmonary complications, morbidity, mortality, among others, and they were willing to proceed.   The patient acknowledged the explanation, agreed to proceed with the plan and consent was signed.   Operative Plan: ORIF left radial neck, loose body excision and lateral ligament repair Discharge Medications: Standard DVT Prophylaxis: None Physical Therapy: outpatient OT Special Discharge needs: Splint. Sling for  comfort    Vernetta Honey, PA-C  01/23/2020 4:33 PM

## 2020-01-24 ENCOUNTER — Other Ambulatory Visit: Payer: Self-pay

## 2020-01-24 ENCOUNTER — Ambulatory Visit (HOSPITAL_BASED_OUTPATIENT_CLINIC_OR_DEPARTMENT_OTHER): Payer: 59 | Admitting: Certified Registered Nurse Anesthetist

## 2020-01-24 ENCOUNTER — Ambulatory Visit: Payer: 59 | Admitting: Psychology

## 2020-01-24 ENCOUNTER — Encounter (HOSPITAL_BASED_OUTPATIENT_CLINIC_OR_DEPARTMENT_OTHER): Payer: Self-pay | Admitting: Orthopaedic Surgery

## 2020-01-24 ENCOUNTER — Ambulatory Visit (HOSPITAL_BASED_OUTPATIENT_CLINIC_OR_DEPARTMENT_OTHER)
Admission: RE | Admit: 2020-01-24 | Discharge: 2020-01-24 | Disposition: A | Discharge status: Home / Self Care | Payer: 59 | Attending: Orthopaedic Surgery | Admitting: Orthopaedic Surgery

## 2020-01-24 ENCOUNTER — Encounter (HOSPITAL_BASED_OUTPATIENT_CLINIC_OR_DEPARTMENT_OTHER)
Admission: RE | Disposition: A | Discharge status: Home / Self Care | Payer: Self-pay | Source: Home / Self Care | Attending: Orthopaedic Surgery

## 2020-01-24 DIAGNOSIS — Z793 Long term (current) use of hormonal contraceptives: Secondary | ICD-10-CM | POA: Insufficient documentation

## 2020-01-24 DIAGNOSIS — Z87891 Personal history of nicotine dependence: Secondary | ICD-10-CM | POA: Diagnosis not present

## 2020-01-24 DIAGNOSIS — G43909 Migraine, unspecified, not intractable, without status migrainosus: Secondary | ICD-10-CM | POA: Insufficient documentation

## 2020-01-24 DIAGNOSIS — S52132A Displaced fracture of neck of left radius, initial encounter for closed fracture: Secondary | ICD-10-CM | POA: Insufficient documentation

## 2020-01-24 DIAGNOSIS — S52002A Unspecified fracture of upper end of left ulna, initial encounter for closed fracture: Secondary | ICD-10-CM | POA: Insufficient documentation

## 2020-01-24 DIAGNOSIS — F419 Anxiety disorder, unspecified: Secondary | ICD-10-CM | POA: Diagnosis not present

## 2020-01-24 DIAGNOSIS — K589 Irritable bowel syndrome without diarrhea: Secondary | ICD-10-CM | POA: Insufficient documentation

## 2020-01-24 DIAGNOSIS — Z79899 Other long term (current) drug therapy: Secondary | ICD-10-CM | POA: Insufficient documentation

## 2020-01-24 DIAGNOSIS — M24022 Loose body in left elbow: Secondary | ICD-10-CM | POA: Insufficient documentation

## 2020-01-24 DIAGNOSIS — W19XXXA Unspecified fall, initial encounter: Secondary | ICD-10-CM | POA: Insufficient documentation

## 2020-01-24 DIAGNOSIS — Z791 Long term (current) use of non-steroidal anti-inflammatories (NSAID): Secondary | ICD-10-CM | POA: Diagnosis not present

## 2020-01-24 HISTORY — PX: ARTHROTOMY: SHX5728

## 2020-01-24 HISTORY — PX: LIGAMENT REPAIR: SHX5444

## 2020-01-24 LAB — POCT PREGNANCY, URINE: Preg Test, Ur: NEGATIVE

## 2020-01-24 SURGERY — REPAIR, LIGAMENT
Anesthesia: Regional | Site: Elbow | Laterality: Left

## 2020-01-24 MED ORDER — CEFAZOLIN SODIUM-DEXTROSE 2-4 GM/100ML-% IV SOLN
INTRAVENOUS | Status: AC
  Filled 2020-01-24: qty 100

## 2020-01-24 MED ORDER — FENTANYL CITRATE (PF) 100 MCG/2ML IJ SOLN: 25.0000 ug | INTRAMUSCULAR | Status: DC | PRN

## 2020-01-24 MED ORDER — GLYCOPYRROLATE 0.2 MG/ML IJ SOLN
INTRAMUSCULAR | Status: DC | PRN
Start: 2020-01-24 — End: 2020-01-24
  Administered 2020-01-24: .2 mg via INTRAVENOUS

## 2020-01-24 MED ORDER — ONDANSETRON HCL 4 MG/2ML IJ SOLN
INTRAMUSCULAR | Status: AC
  Filled 2020-01-24: qty 2

## 2020-01-24 MED ORDER — DICLOFENAC SODIUM 75 MG PO TBEC: 75.0000 mg | DELAYED_RELEASE_TABLET | Freq: Two times a day (BID) | ORAL | 0 refills | Status: AC

## 2020-01-24 MED ORDER — GLYCOPYRROLATE 0.2 MG/ML IJ SOLN
INTRAMUSCULAR | Status: AC
  Filled 2020-01-24: qty 1

## 2020-01-24 MED ORDER — MIDAZOLAM HCL 2 MG/2ML IJ SOLN
INTRAMUSCULAR | Status: AC
  Filled 2020-01-24: qty 2

## 2020-01-24 MED ORDER — ONDANSETRON HCL 4 MG PO TABS: 4.0000 mg | ORAL_TABLET | Freq: Three times a day (TID) | ORAL | 1 refills | Status: AC | PRN

## 2020-01-24 MED ORDER — FENTANYL CITRATE (PF) 100 MCG/2ML IJ SOLN
100.0000 ug | Freq: Once | INTRAMUSCULAR | Status: AC
  Administered 2020-01-24: 100 ug via INTRAVENOUS

## 2020-01-24 MED ORDER — LIDOCAINE HCL (CARDIAC) PF 100 MG/5ML IV SOSY
PREFILLED_SYRINGE | INTRAVENOUS | Status: DC | PRN
Start: 2020-01-24 — End: 2020-01-24
  Administered 2020-01-24: 100 mg via INTRATRACHEAL

## 2020-01-24 MED ORDER — ARTIFICIAL TEARS OPHTHALMIC OINT
TOPICAL_OINTMENT | OPHTHALMIC | Status: DC | PRN
  Administered 2020-01-24: 1 via OPHTHALMIC

## 2020-01-24 MED ORDER — MIDAZOLAM HCL 2 MG/2ML IJ SOLN
INTRAMUSCULAR | Status: DC | PRN
Start: 2020-01-24 — End: 2020-01-24
  Administered 2020-01-24 (×2): 1 mg via INTRAVENOUS

## 2020-01-24 MED ORDER — FENTANYL CITRATE (PF) 100 MCG/2ML IJ SOLN
INTRAMUSCULAR | Status: AC
  Filled 2020-01-24: qty 2

## 2020-01-24 MED ORDER — OXYCODONE HCL 5 MG PO TABS: ORAL_TABLET | ORAL | 0 refills | Status: AC

## 2020-01-24 MED ORDER — ONDANSETRON HCL 4 MG/2ML IJ SOLN: 4.0000 mg | Freq: Once | INTRAMUSCULAR | Status: DC | PRN

## 2020-01-24 MED ORDER — CEFAZOLIN SODIUM-DEXTROSE 2-4 GM/100ML-% IV SOLN
2.0000 g | INTRAVENOUS | Status: AC
  Administered 2020-01-24: 2 g via INTRAVENOUS

## 2020-01-24 MED ORDER — KETOROLAC TROMETHAMINE 30 MG/ML IJ SOLN: 30.0000 mg | Freq: Once | INTRAMUSCULAR | Status: DC | PRN

## 2020-01-24 MED ORDER — DEXAMETHASONE SODIUM PHOSPHATE 10 MG/ML IJ SOLN
INTRAMUSCULAR | Status: DC | PRN
Start: 2020-01-24 — End: 2020-01-24
  Administered 2020-01-24: 10 mg

## 2020-01-24 MED ORDER — FENTANYL CITRATE (PF) 100 MCG/2ML IJ SOLN
INTRAMUSCULAR | Status: DC | PRN
  Administered 2020-01-24: 100 ug via INTRAVENOUS

## 2020-01-24 MED ORDER — ARTIFICIAL TEARS OPHTHALMIC OINT
TOPICAL_OINTMENT | OPHTHALMIC | Status: AC
  Filled 2020-01-24: qty 3.5

## 2020-01-24 MED ORDER — CLONIDINE HCL (ANALGESIA) 100 MCG/ML EP SOLN
EPIDURAL | Status: DC | PRN
  Administered 2020-01-24: 100 ug

## 2020-01-24 MED ORDER — ROPIVACAINE HCL 7.5 MG/ML IJ SOLN
INTRAMUSCULAR | Status: DC | PRN
Start: 2020-01-24 — End: 2020-01-24
  Administered 2020-01-24 (×4): 5 mL via PERINEURAL

## 2020-01-24 MED ORDER — VANCOMYCIN HCL 1000 MG IV SOLR
INTRAVENOUS | Status: DC | PRN
  Administered 2020-01-24: 1000 mg

## 2020-01-24 MED ORDER — ACETAMINOPHEN 500 MG PO TABS
1000.0000 mg | ORAL_TABLET | Freq: Three times a day (TID) | ORAL | 0 refills | Status: AC
Start: 2020-01-24 — End: 2020-02-07

## 2020-01-24 MED ORDER — PROPOFOL 10 MG/ML IV BOLUS
INTRAVENOUS | Status: DC | PRN
Start: 2020-01-24 — End: 2020-01-24
  Administered 2020-01-24: 200 mg via INTRAVENOUS

## 2020-01-24 MED ORDER — PROPOFOL 10 MG/ML IV BOLUS
INTRAVENOUS | Status: AC
  Filled 2020-01-24: qty 20

## 2020-01-24 MED ORDER — DEXAMETHASONE SODIUM PHOSPHATE 10 MG/ML IJ SOLN
INTRAMUSCULAR | Status: DC | PRN
  Administered 2020-01-24: 10 mg via INTRAVENOUS

## 2020-01-24 MED ORDER — EPHEDRINE SULFATE 50 MG/ML IJ SOLN
INTRAMUSCULAR | Status: DC | PRN
  Administered 2020-01-24: 10 mg via INTRAVENOUS
  Administered 2020-01-24: 15 mg via INTRAVENOUS

## 2020-01-24 MED ORDER — MIDAZOLAM HCL 2 MG/2ML IJ SOLN
2.0000 mg | Freq: Once | INTRAMUSCULAR | Status: AC
  Administered 2020-01-24: 2 mg via INTRAVENOUS

## 2020-01-24 MED ORDER — LACTATED RINGERS IV SOLN: INTRAVENOUS | Status: DC

## 2020-01-24 MED ORDER — LIDOCAINE 2% (20 MG/ML) 5 ML SYRINGE
INTRAMUSCULAR | Status: AC
  Filled 2020-01-24: qty 5

## 2020-01-24 MED ORDER — DEXAMETHASONE SODIUM PHOSPHATE 10 MG/ML IJ SOLN
INTRAMUSCULAR | Status: AC
  Filled 2020-01-24: qty 1

## 2020-01-24 MED ORDER — EPHEDRINE 5 MG/ML INJ
INTRAVENOUS | Status: AC
  Filled 2020-01-24: qty 10

## 2020-01-24 MED ORDER — DEXMEDETOMIDINE (PRECEDEX) IN NS 20 MCG/5ML (4 MCG/ML) IV SYRINGE
PREFILLED_SYRINGE | INTRAVENOUS | Status: DC | PRN
  Administered 2020-01-24: 8 ug via INTRAVENOUS
  Administered 2020-01-24: 4 ug via INTRAVENOUS
  Administered 2020-01-24: 8 ug via INTRAVENOUS

## 2020-01-24 MED ORDER — MEPERIDINE HCL 25 MG/ML IJ SOLN: 6.2500 mg | INTRAMUSCULAR | Status: DC | PRN

## 2020-01-24 MED ORDER — ONDANSETRON HCL 4 MG/2ML IJ SOLN
INTRAMUSCULAR | Status: DC | PRN
Start: 2020-01-24 — End: 2020-01-24
  Administered 2020-01-24: 4 mg via INTRAVENOUS

## 2020-01-24 SURGICAL SUPPLY — 72 items
ANCHOR JUGGERKNOT W/DRL 2/1.45 (Orthopedic Implant) ×6 IMPLANT
BLADE AVERAGE 25MMX9MM (BLADE)
BLADE AVERAGE 25X9 (BLADE) IMPLANT
BLADE HEX COATED 2.75 (ELECTRODE) IMPLANT
BLADE SURG 10 STRL SS (BLADE) ×3 IMPLANT
BLADE SURG 15 STRL LF DISP TIS (BLADE) ×1 IMPLANT
BLADE SURG 15 STRL SS (BLADE) ×3
BNDG ELASTIC 4X5.8 VLCR STR LF (GAUZE/BANDAGES/DRESSINGS) ×6 IMPLANT
BNDG ESMARK 4X9 LF (GAUZE/BANDAGES/DRESSINGS) ×3 IMPLANT
CHLORAPREP W/TINT 26 (MISCELLANEOUS) ×3 IMPLANT
CLOSURE STERI-STRIP 1/2X4 (GAUZE/BANDAGES/DRESSINGS) ×1
CLSR STERI-STRIP ANTIMIC 1/2X4 (GAUZE/BANDAGES/DRESSINGS) ×2 IMPLANT
COVER WAND RF STERILE (DRAPES) IMPLANT
CUFF TOURN SGL QUICK 18X3 (MISCELLANEOUS) IMPLANT
CUFF TOURN SGL QUICK 18X4 (TOURNIQUET CUFF) ×3 IMPLANT
CUFF TOURN SGL QUICK 24 (TOURNIQUET CUFF)
CUFF TRNQT CYL 24X4X16.5-23 (TOURNIQUET CUFF) IMPLANT
DECANTER SPIKE VIAL GLASS SM (MISCELLANEOUS) IMPLANT
DRAPE EXTREMITY T 121X128X90 (DISPOSABLE) ×3 IMPLANT
DRAPE INCISE IOBAN 66X45 STRL (DRAPES) IMPLANT
DRAPE OEC MINIVIEW 54X84 (DRAPES) IMPLANT
DRAPE SURG 17X23 STRL (DRAPES) ×3 IMPLANT
DRAPE U-SHAPE 47X51 STRL (DRAPES) IMPLANT
ELECT REM PT RETURN 9FT ADLT (ELECTROSURGICAL) ×3
ELECTRODE REM PT RTRN 9FT ADLT (ELECTROSURGICAL) ×1 IMPLANT
GAUZE SPONGE 4X4 12PLY STRL (GAUZE/BANDAGES/DRESSINGS) ×3 IMPLANT
GLOVE BIO SURGEON STRL SZ 6.5 (GLOVE) ×2 IMPLANT
GLOVE BIO SURGEONS STRL SZ 6.5 (GLOVE) ×1
GLOVE BIOGEL PI IND STRL 6.5 (GLOVE) ×1 IMPLANT
GLOVE BIOGEL PI IND STRL 7.0 (GLOVE) ×1 IMPLANT
GLOVE BIOGEL PI IND STRL 8 (GLOVE) ×1 IMPLANT
GLOVE BIOGEL PI INDICATOR 6.5 (GLOVE) ×2
GLOVE BIOGEL PI INDICATOR 7.0 (GLOVE) ×2
GLOVE BIOGEL PI INDICATOR 8 (GLOVE) ×2
GLOVE ECLIPSE 6.5 STRL STRAW (GLOVE) ×6 IMPLANT
GLOVE ECLIPSE 8.0 STRL XLNG CF (GLOVE) ×3 IMPLANT
GOWN STRL REUS W/ TWL LRG LVL3 (GOWN DISPOSABLE) ×2 IMPLANT
GOWN STRL REUS W/TWL LRG LVL3 (GOWN DISPOSABLE) ×6
GOWN STRL REUS W/TWL XL LVL3 (GOWN DISPOSABLE) ×3 IMPLANT
K-WIRE ACE 1.6X6 (WIRE) ×3
KWIRE ACE 1.6X6 (WIRE) ×1 IMPLANT
NS IRRIG 1000ML POUR BTL (IV SOLUTION) ×3 IMPLANT
PACK ARTHROSCOPY DSU (CUSTOM PROCEDURE TRAY) ×3 IMPLANT
PACK BASIN DAY SURGERY FS (CUSTOM PROCEDURE TRAY) ×3 IMPLANT
PAD CAST 4YDX4 CTTN HI CHSV (CAST SUPPLIES) ×1 IMPLANT
PADDING CAST COTTON 4X4 STRL (CAST SUPPLIES) ×3
PENCIL SMOKE EVACUATOR (MISCELLANEOUS) ×3 IMPLANT
RETRIEVER SUT HEWSON (MISCELLANEOUS) IMPLANT
SHEET MEDIUM DRAPE 40X70 STRL (DRAPES) ×3 IMPLANT
SLEEVE SCD COMPRESS KNEE MED (MISCELLANEOUS) ×3 IMPLANT
SLING ARM FOAM STRAP LRG (SOFTGOODS) ×3 IMPLANT
SPLINT FAST PLASTER 5X30 (CAST SUPPLIES) ×20
SPLINT PLASTER CAST FAST 5X30 (CAST SUPPLIES) ×10 IMPLANT
SPONGE LAP 18X18 RF (DISPOSABLE) ×3 IMPLANT
SUCTION FRAZIER HANDLE 10FR (MISCELLANEOUS)
SUCTION TUBE FRAZIER 10FR DISP (MISCELLANEOUS) IMPLANT
SUT FIBERWIRE 2-0 18 17.9 3/8 (SUTURE) ×3
SUT MAXBRAID #2 CVD NDL (SUTURE) IMPLANT
SUT MNCRL AB 4-0 PS2 18 (SUTURE) ×3 IMPLANT
SUT VIC AB 0 CT1 27 (SUTURE) ×3
SUT VIC AB 0 CT1 27XBRD ANBCTR (SUTURE) ×1 IMPLANT
SUT VIC AB 1 CT1 27 (SUTURE)
SUT VIC AB 1 CT1 27XBRD ANBCTR (SUTURE) IMPLANT
SUT VIC AB 2-0 CT1 (SUTURE) IMPLANT
SUT VIC AB 2-0 SH 27 (SUTURE)
SUT VIC AB 2-0 SH 27XBRD (SUTURE) IMPLANT
SUT VIC AB 3-0 SH 27 (SUTURE) ×3
SUT VIC AB 3-0 SH 27X BRD (SUTURE) ×1 IMPLANT
SUTURE FIBERWR 2-0 18 17.9 3/8 (SUTURE) ×1 IMPLANT
SYR BULB EAR ULCER 3OZ GRN STR (SYRINGE) ×3 IMPLANT
TOWEL GREEN STERILE FF (TOWEL DISPOSABLE) ×6 IMPLANT
TUBE SUCTION HIGH CAP CLEAR NV (SUCTIONS) ×3 IMPLANT

## 2020-01-24 NOTE — Anesthesia Procedure Notes (Signed)
Anesthesia Regional Block: Supraclavicular block   Pre-Anesthetic Checklist: ,, timeout performed, Correct Patient, Correct Site, Correct Laterality, Correct Procedure, Correct Position, site marked, Risks and benefits discussed,  Surgical consent,  Pre-op evaluation,  At surgeon's request and post-op pain management  Laterality: Upper and Left  Prep: chloraprep       Needles:  Injection technique: Single-shot  Needle Type: Echogenic Stimulator Needle     Needle Length: 9cm  Needle Gauge: 20   Needle insertion depth: 2.5 cm   Additional Needles:   Procedures:,,,, ultrasound used (permanent image in chart),,,,  Narrative:  Start time: 01/24/2020 11:05 AM End time: 01/24/2020 11:13 AM Injection made incrementally with aspirations every 5 mL.  Performed by: Personally  Anesthesiologist: Leilani Able, MD

## 2020-01-24 NOTE — Discharge Instructions (Signed)

## 2020-01-24 NOTE — Op Note (Signed)
Orthopaedic Surgery Operative Note (CSN: [id number])  Brianna Davis  [birth year] Date of Surgery: 01/24/2020   Diagnoses:  Left radial neck fracture, elbow dislocation, coronoid fracture and lateral ligament injury  Procedure: Left open elbow loose body excision Left lateral ligament repair Left capitellar microfracture   Operative Finding Successful completion of the planned procedure.  Patient's radial neck was plastically deformed but was robust and intact we did not feel further fixation was necessary.  Lateral ligaments were two thirds off posteriorly and the elbow was clearly unstable though not grossly.  Coronoid fracture did not require fixation in the radial neck.  To be stable just we proceeded with a loose body excision.  There is a 1 x 1 cm capitellar chondral defect that is microfractured and a shear injury to the radial head in line with the radial styloid from the dislocation.  Multiple loose bodies removed.  Post-operative plan: The patient will be nonweightbearing in splint for a week to transition to a hinged elbow brace unlocked and range of motion encouraged.  The patient will be discharged home.  DVT prophylaxis not indicated in this ambulatory upper extremity patient without significant risk factors.   Pain control with PRN pain medication preferring oral medicines.  Follow up plan will be scheduled in approximately 7 days for incision check and XR.  Post-Op Diagnosis: Same Surgeons:Primary: Hiram Gash, MD Assistants:Brianna McBane PA-C Location: [place] [facility] ROOM 6 Anesthesia: General with regional anesthesia Antibiotics: Ancef 2 g with local vancomycin powder 1 g at the surgical site Tourniquet time:  Total Tourniquet Time Documented: Upper Arm (Left) - 38 minutes Total: Upper Arm (Left) - 38 minutes  Estimated Blood Loss: Minimal Complications: None Specimens: None Implants: Implant Name Type Inv. Item Serial No. Manufacturer Lot No. LRB No. Used Action  KIT  IMPLANT JUGGERKNOT SZ2 - [id number] Orthopedic Implant KIT IMPLANT JUGGERKNOT SZ2  [employer](ORTH,TRAU,BIO,SG) [id number] Left 1 Implanted  KIT IMPLANT JUGGERKNOT SZ2 - [id number] Orthopedic Implant KIT IMPLANT JUGGERKNOT SZ2  [employer](ORTH,TRAU,BIO,SG) [id number] Left 1 Implanted    Indications for Surgery:   Brianna Davis is a 44 y.o. female with the fall with terrible triad type injury and mechanical symptoms and capitellar sag on CT and x-ray.  We did discuss nonoperative measures for now in a later spoke with the patient desired going forward now to avoid a secondary recovery phase in the later date.  Benefits and risks of operative and nonoperative management were discussed prior to surgery with patient/guardian(s) and informed consent form was completed.  Specific risks including infection, need for additional surgery, nonunion, loss of fixation, post traumatic arthrosis and instability amongst others.   Procedure:   The patient was identified properly. Informed consent was obtained and the surgical site was marked. The patient was taken up to suite where general anesthesia was induced.  The patient was positioned supine on a hand table.  The left elbow was prepped and draped in the usual sterile fashion.  Timeout was performed before the beginning of the case.  Tourniquet was used for the above duration.  We began with a Kocher interval approach to the lateral elbow going through skin sharply achieving hemostasis as we progressed.  We identified the fashion with the Kocher's interval and noted that there is significant trauma to the posterior lateral ligaments, we elevated flaps and open the joint noting significant hematoma.  Immediately there was a large one by one osteochondral loose fragment that was withdrawn.  We then found multiple small  osteochondral fragments that were in the posterior aspect of the recesses of the joint.  We were able to clear the majority of the joint without  taken down the ligament to be difficult to see completely across the joint.  We irrigated copiously to try and remove any further loose bodies.  This point we took assessment of the joint.  There was a 10 x 3 mm lesion that was a shear lesion to the radial head consistent with the osteochondral fracture from the dislocation in line with the radial styloid.  This had grade 4 cartilage loss that appeared to be stable the radial neck was stable.  We took the elbow through range of motion it was full.  We note that there was a 1 x 1 cm full-thickness cartilage loss to the lateral most aspect of the radiocapitellar joint and a posterior lateral fracture to the posterior lateral capitellum.  We felt that the grade 4 cartilage loss on the majority of the capitellum with may be become symptomatic and felt the microfracture was necessary.  A 0.062 K wire was used to perform microfracture.  This point were happy with our removal of loose bodies and the fracture stability.  We irrigated again and placed 2 juggernaut suture anchors repairing the lateral ligaments.  We did closed the annular ligament in a separate layer with 2-0 FiberWire.  Very happy with the stability at the end of this.  Fascia and incision were closed with absorbable sutures in a multilayer fashion for sterile splint was placed.  Vancomycin powder was placed before closure.  Patient was awoken taken to PACU in stable condition.  Brianna Chapel, PA-C, present and scrubbed throughout the case, critical for completion in a timely fashion, and for retraction, instrumentation, closure.

## 2020-01-24 NOTE — Progress Notes (Signed)
Assisted Dr. Hatchett with left, ultrasound guided, supraclavicular block. Side rails up, monitors on throughout procedure. See vital signs in flow sheet. Tolerated Procedure well. 

## 2020-01-24 NOTE — Interval H&P Note (Signed)
History and Physical Interval Note:  01/24/2020 10:55 AM  Brianna Davis  has presented today for surgery, with the diagnosis of RADIAL NECK FRACTURE, RADIAL HEAD DISLOCATION, LOOSE BODY LEFT ELBOW.  The various methods of treatment have been discussed with the patient and family. After consideration of risks, benefits and other options for treatment, the patient has consented to  Procedure(s): OPEN REDUCTION INTERNAL FIXATION (ORIF) RADIAL HEADFRACTURE, REPAIR LATERAL COLLATERAL LIGAMENT ELBOW WITH LOCAL TISSUE, ARTHROTOMY ELBOW WITH JOINT EXPLORATION WITHOUT BIOPSY WITH REMOVAL OF LOOSE BODY (Left) as a surgical intervention.  The patient's history has been reviewed, patient examined, no change in status, stable for surgery.  I have reviewed the patient's chart and labs.  Questions were answered to the patient's satisfaction.     Bjorn Pippin

## 2020-01-24 NOTE — Anesthesia Procedure Notes (Signed)
Procedure Name: LMA Insertion Date/Time: 01/24/2020 11:34 AM Performed by: Salomon Mast, CRNA Pre-anesthesia Checklist: Patient identified, Emergency Drugs available, Suction available and Patient being monitored Patient Re-evaluated:Patient Re-evaluated prior to induction Oxygen Delivery Method: Circle system utilized Preoxygenation: Pre-oxygenation with 100% oxygen Induction Type: IV induction LMA: LMA inserted LMA Size: 4.0 Number of attempts: 1 Placement Confirmation: positive ETCO2 and breath sounds checked- equal and bilateral Tube secured with: Tape

## 2020-01-24 NOTE — Anesthesia Postprocedure Evaluation (Signed)
Anesthesia Post Note  Patient: Brianna Davis  Procedure(s) Performed: REPAIR LATERAL COLLATERAL LIGAMENT ELBOW WITH LOCAL TISSUE, ARTHROTOMY ELBOW WITH JOINT EXPLORATION WITHOUT BIOPSY WITH REMOVAL OF LOOSE BODY, MICROFRACTURE (Left Elbow) ARTHROTOMY (Left Elbow)     Patient location during evaluation: PACU Anesthesia Type: Regional Level of consciousness: awake and sedated Pain management: pain level controlled Vital Signs Assessment: post-procedure vital signs reviewed and stable Respiratory status: spontaneous breathing Cardiovascular status: stable Postop Assessment: no apparent nausea or vomiting Anesthetic complications: no   No complications documented.  Last Vitals:  Vitals:   01/24/20 1330 01/24/20 1340  BP: 121/81 117/85  Pulse: 67 75  Resp: 20 20  Temp:  36.4 C  SpO2: 96% 96%    Last Pain:  Vitals:   01/24/20 1330  TempSrc:   PainSc: 0-No pain                 Caren Macadam

## 2020-01-24 NOTE — Transfer of Care (Signed)
Immediate Anesthesia Transfer of Care Note  Patient: Brianna Davis  Procedure(s) Performed: REPAIR LATERAL COLLATERAL LIGAMENT ELBOW WITH LOCAL TISSUE, ARTHROTOMY ELBOW WITH JOINT EXPLORATION WITHOUT BIOPSY WITH REMOVAL OF LOOSE BODY, MICROFRACTURE (Left Elbow) ARTHROTOMY (Left Elbow)  Patient Location: PACU  Anesthesia Type:General and Regional  Level of Consciousness: awake, oriented, drowsy and patient cooperative  Airway & Oxygen Therapy: Patient Spontanous Breathing and Patient connected to face mask oxygen  Post-op Assessment: Report given to RN, Post -op Vital signs reviewed and stable, Patient moving all extremities X 4 and Patient able to stick tongue midline  Post vital signs: Reviewed and stable  Last Vitals:  Vitals Value Taken Time  BP 121/81 01/24/20 1238  Temp    Pulse 82 01/24/20 1239  Resp 19 01/24/20 1239  SpO2 97 % 01/24/20 1239  Vitals shown include unvalidated device data.  Last Pain:  Vitals:   01/24/20 0930  TempSrc: Oral  PainSc: 5          Complications: No complications documented.

## 2020-01-24 NOTE — Anesthesia Preprocedure Evaluation (Addendum)
Anesthesia Evaluation  Patient identified by MRN, date of birth, ID band Patient awake    Reviewed: Allergy & Precautions, H&P , NPO status , Patient's Chart, lab work & pertinent test results  Airway Mallampati: I  TM Distance: >3 FB Neck ROM: full    Dental no notable dental hx. (+) Teeth Intact   Pulmonary Patient abstained from smoking., former smoker,    Pulmonary exam normal breath sounds clear to auscultation       Cardiovascular Normal cardiovascular exam Rhythm:Regular Rate:Normal     Neuro/Psych  Headaches, PSYCHIATRIC DISORDERS Anxiety    GI/Hepatic Neg liver ROS,   Endo/Other  negative endocrine ROS  Renal/GU   negative genitourinary   Musculoskeletal   Abdominal (+) + obese,   Peds  Hematology negative hematology ROS (+)   Anesthesia Other Findings   Reproductive/Obstetrics negative OB ROS                             Anesthesia Physical  Anesthesia Plan  ASA: II  Anesthesia Plan: General   Post-op Pain Management:  Regional for Post-op pain   Induction: Intravenous  PONV Risk Score and Plan: 4 or greater and Ondansetron, Dexamethasone, Propofol, Midazolam and Scopolamine patch - Pre-op  Airway Management Planned: Natural Airway, Simple Face Mask, Nasal Cannula and LMA  Additional Equipment: None  Intra-op Plan:   Post-operative Plan: Extubation in OR  Informed Consent: I have reviewed the patients History and Physical, chart, labs and discussed the procedure including the risks, benefits and alternatives for the proposed anesthesia with the patient or authorized representative who has indicated his/her understanding and acceptance.     Dental advisory given  Plan Discussed with: CRNA  Anesthesia Plan Comments:        Anesthesia Quick Evaluation

## 2020-02-06 ENCOUNTER — Ambulatory Visit: Payer: 59 | Admitting: Psychology

## 2020-08-13 ENCOUNTER — Emergency Department (HOSPITAL_COMMUNITY): Payer: 59

## 2020-08-13 ENCOUNTER — Other Ambulatory Visit: Payer: Self-pay

## 2020-08-13 ENCOUNTER — Emergency Department (HOSPITAL_COMMUNITY)
Admission: EM | Admit: 2020-08-13 | Discharge: 2020-08-13 | Disposition: A | Discharge status: Home / Self Care | Payer: 59 | Attending: Emergency Medicine | Admitting: Emergency Medicine

## 2020-08-13 ENCOUNTER — Encounter (HOSPITAL_COMMUNITY): Payer: Self-pay

## 2020-08-13 DIAGNOSIS — R1031 Right lower quadrant pain: Secondary | ICD-10-CM | POA: Diagnosis present

## 2020-08-13 DIAGNOSIS — N200 Calculus of kidney: Secondary | ICD-10-CM | POA: Diagnosis not present

## 2020-08-13 DIAGNOSIS — F17210 Nicotine dependence, cigarettes, uncomplicated: Secondary | ICD-10-CM | POA: Insufficient documentation

## 2020-08-13 DIAGNOSIS — Z8719 Personal history of other diseases of the digestive system: Secondary | ICD-10-CM | POA: Diagnosis not present

## 2020-08-13 LAB — CBC WITH DIFFERENTIAL/PLATELET
Abs Immature Granulocytes: 0.01 10*3/uL (ref 0.00–0.07)
Basophils Absolute: 0.1 10*3/uL (ref 0.0–0.1)
Basophils Relative: 1 %
Eosinophils Absolute: 0.2 10*3/uL (ref 0.0–0.5)
Eosinophils Relative: 4 %
HCT: 43.9 % (ref 36.0–46.0)
Hemoglobin: 15.1 g/dL — ABNORMAL HIGH (ref 12.0–15.0)
Immature Granulocytes: 0 %
Lymphocytes Relative: 37 %
Lymphs Abs: 2.3 10*3/uL (ref 0.7–4.0)
MCH: 31.9 pg (ref 26.0–34.0)
MCHC: 34.4 g/dL (ref 30.0–36.0)
MCV: 92.6 fL (ref 80.0–100.0)
Monocytes Absolute: 0.3 10*3/uL (ref 0.1–1.0)
Monocytes Relative: 6 %
Neutro Abs: 3.3 10*3/uL (ref 1.7–7.7)
Neutrophils Relative %: 52 %
Platelets: 360 10*3/uL (ref 150–400)
RBC: 4.74 MIL/uL (ref 3.87–5.11)
RDW: 11.9 % (ref 11.5–15.5)
WBC: 6.2 10*3/uL (ref 4.0–10.5)
nRBC: 0 % (ref 0.0–0.2)

## 2020-08-13 LAB — COMPREHENSIVE METABOLIC PANEL
ALT: 15 U/L (ref 0–44)
AST: 17 U/L (ref 15–41)
Albumin: 4.3 g/dL (ref 3.5–5.0)
Alkaline Phosphatase: 81 U/L (ref 38–126)
Anion gap: 8 (ref 5–15)
BUN: 9 mg/dL (ref 6–20)
CO2: 26 mmol/L (ref 22–32)
Calcium: 9.2 mg/dL (ref 8.9–10.3)
Chloride: 104 mmol/L (ref 98–111)
Creatinine, Ser: 0.69 mg/dL (ref 0.44–1.00)
GFR, Estimated: >60 mL/min (ref >60)
Glucose, Bld: 96 mg/dL (ref 70–99)
Potassium: 3.2 mmol/L — ABNORMAL LOW (ref 3.5–5.1)
Sodium: 138 mmol/L (ref 135–145)
Total Bilirubin: 0.8 mg/dL (ref 0.3–1.2)
Total Protein: 7.5 g/dL (ref 6.5–8.1)

## 2020-08-13 LAB — URINALYSIS, ROUTINE W REFLEX MICROSCOPIC
Bilirubin Urine: NEGATIVE
Glucose, UA: NEGATIVE mg/dL
Ketones, ur: NEGATIVE mg/dL
Nitrite: NEGATIVE
Protein, ur: NEGATIVE mg/dL
Specific Gravity, Urine: 1.006 (ref 1.005–1.030)
pH: 8 (ref 5.0–8.0)

## 2020-08-13 LAB — LIPASE, BLOOD: Lipase: 31 U/L (ref 11–51)

## 2020-08-13 LAB — PREGNANCY, URINE: Preg Test, Ur: NEGATIVE

## 2020-08-13 MED ORDER — POTASSIUM CHLORIDE CRYS ER 20 MEQ PO TBCR: 40.0000 meq | EXTENDED_RELEASE_TABLET | Freq: Once | ORAL | Status: DC

## 2020-08-13 MED ORDER — SODIUM CHLORIDE 0.9 % IV BOLUS
1000.0000 mL | Freq: Once | INTRAVENOUS | Status: AC
  Administered 2020-08-13: 1000 mL via INTRAVENOUS

## 2020-08-13 MED ORDER — FENTANYL CITRATE (PF) 100 MCG/2ML IJ SOLN
50.0000 ug | Freq: Once | INTRAMUSCULAR | Status: AC
  Administered 2020-08-13: 50 ug via INTRAVENOUS
  Filled 2020-08-13: qty 2

## 2020-08-13 MED ORDER — IOHEXOL 300 MG/ML  SOLN
100.0000 mL | Freq: Once | INTRAMUSCULAR | Status: AC | PRN
  Administered 2020-08-13: 100 mL via INTRAVENOUS

## 2020-08-13 MED ORDER — MORPHINE SULFATE (PF) 4 MG/ML IV SOLN
6.0000 mg | Freq: Once | INTRAVENOUS | Status: AC
  Administered 2020-08-13: 6 mg via INTRAVENOUS
  Filled 2020-08-13: qty 2

## 2020-08-13 MED ORDER — TAMSULOSIN HCL 0.4 MG PO CAPS: 0.4000 mg | ORAL_CAPSULE | Freq: Every day | ORAL | 0 refills | Status: DC

## 2020-08-13 MED ORDER — OXYCODONE-ACETAMINOPHEN 5-325 MG PO TABS: 1.0000 | ORAL_TABLET | Freq: Three times a day (TID) | ORAL | 0 refills | Status: DC | PRN

## 2020-08-13 NOTE — ED Provider Notes (Signed)
[facility] EMERGENCY DEPARTMENT Provider Note   CSN: [id number] Arrival date & time: 08/13/20  1114     History Chief Complaint  Patient presents with  . Abdominal Pain    Brianna Davis is a 45 y.o. female with past medical history of anxiety, IBS, migraines, nephrolithiasis that presents to the emergency department today for right lower quadrant abdominal pain.  Patient states that she has had abdominal pain for the past 5 months, diagnosed with IBS.  States that she has had pain on the right side for this time as well, however right lower quadrant pain worsened yesterday and today.  States that she has been taking Tylenol without relief.  Denies any abdominal surgeries.  States that she has had some nausea in the past 5 months with this in addition to some weight loss due to some anorexia however no nausea or vomiting now. States that she just has not had an appetite in the past couple of months, no night sweats fevers or chills.  Denies any diarrhea.  States that she is unsure if this pain is related to her IBS, however is worse therefore she wanted to come to get this evaluated.  States that she feels as if this is a kidney stone.  Pain is intermittent in this area.  Denies any dysuria or hematuria.  Denies any vaginal bleeding, vaginal discharge or pelvic pain.  Denies any back pain or chest pain.  Has not seen anyone for this.  States that she has had some diarrhea for the past 5 months, however relates that to her IBS.  No hematochezia or diarrhea currently.  HPI     Past Medical History:  Diagnosis Date  . Anxiety   . Degenerative disc disease   . Degenerative disc disease, lumbar   . IBS (irritable bowel syndrome)   . Migraine   . Nephrolithiasis   . S/P endoscopy April 2012   Dr. Jena Gauss: friability at GE junction, negative Barrett's orH.pylori, diffuse gastric petechia    Patient Active Problem List   Diagnosis Date Noted  . Screening for viral disease 04/25/2019  .  Ureteral calculus 12/25/2016  . RUQ abdominal pain 10/14/2016  . Nausea without vomiting 10/14/2016  . Generalized anxiety disorder 09/10/2016  . Postpartum care following cesarean delivery (12/19) 06/08/2014  . Postoperative state 06/08/2014  . Normal labor 06/06/2014  . Pregnancy 05/17/2014  . Gestational hypertension w/o significant proteinuria in 3rd trimester 05/15/2014  . Acute pyelonephritis 09/11/2012  . GERD (gastroesophageal reflux disease) 09/28/2010  . Epigastric pain 09/28/2010  . Dysphagia 09/28/2010  . IBS (irritable bowel syndrome) 09/28/2010  . Esophageal dysphagia 09/28/2010    Past Surgical History:  Procedure Laterality Date  . ARTHROTOMY Left 01/24/2020   Procedure: ARTHROTOMY;  Surgeon: Bjorn Pippin, MD;  Location: [facility];  Service: Orthopedics;  Laterality: Left;  . CESAREAN SECTION N/A 06/07/2014   Procedure: CESAREAN SECTION;  Surgeon: Genia Del, MD;  Location: [facility];  Service: Obstetrics;  Laterality: N/A;  . CHROMOPERTUBATION Bilateral 04/03/2013   Procedure: CHROMOPERTUBATION with Methylene Blue, with patent right tube and proximal obstruction of left tube;  Surgeon: Genia Del, MD;  Location: [facility];  Service: Gynecology;  Laterality: Bilateral;  . CYSTOSCOPY W/ URETERAL STENT PLACEMENT Left 12/25/2016   Procedure: CYSTOSCOPY WITH LEFT RETROGRADE PYELOGRAM/LEFT STONE EXTRACTION/LEFT URETERAL STENT PLACEMENT;  Surgeon: Marcine Matar, MD;  Location: [facility];  Service: Urology;  Laterality: Left;  . ESOPHAGOGASTRODUODENOSCOPY  09/30/2010   HDQ:[facility] hiatal hernia/Friability  at the gastroesophageal junction with undulating Z-line versus short segment Barrett esophagus s/p bx  . ileocolonoscopy  12/2007   Friable anal canal.  . LAPAROSCOPY N/A 04/03/2013   Procedure: LAPAROSCOPY DIAGNOSTIC;  Surgeon: Genia Del, MD;  Location: [facility];  Service: Gynecology;  Laterality: N/A;  . LIGAMENT REPAIR Left 01/24/2020    Procedure: REPAIR LATERAL COLLATERAL LIGAMENT ELBOW WITH LOCAL TISSUE, ARTHROTOMY ELBOW WITH JOINT EXPLORATION WITHOUT BIOPSY WITH REMOVAL OF LOOSE BODY, MICROFRACTURE;  Surgeon: Bjorn Pippin, MD;  Location: [facility];  Service: Orthopedics;  Laterality: Left;  . ROBOTIC ASSISTED LAPAROSCOPIC LYSIS OF ADHESION N/A 04/03/2013   Procedure: ROBOTIC ASSISTED LAPAROSCOPIC LYSIS OF ADHESION; LYSIS OF ENDOMETRIOSIS ;  Surgeon: Genia Del, MD;  Location: [facility];  Service: Gynecology;  Laterality: N/A;     OB History    Gravida  2   Para  1   Term  1   Preterm      AB  1   Living  1     SAB  1   IAB      Ectopic      Multiple  0   Live Births  1           Family History  Problem Relation Age of Onset  . Irritable bowel syndrome Mother   . GI problems Sister        gastroparesis  . Colon cancer Neg Hx   . Inflammatory bowel disease Neg Hx   . Liver disease Neg Hx     Social History   Tobacco Use  . Smoking status: Current Every Day Smoker    Packs/day: 0.50    Years: 14.00    Pack years: 7.00    Types: Cigarettes    Start date: 03/08/1996  . Smokeless tobacco: Never Used  Vaping Use  . Vaping Use: Never used  Substance Use Topics  . Alcohol use: No    Alcohol/week: 0.0 standard drinks  . Drug use: No    Home Medications Prior to Admission medications   Medication Sig Start Date End Date Taking? Authorizing Provider  oxyCODONE-acetaminophen (PERCOCET/ROXICET) 5-325 MG tablet Take 1 tablet by mouth every 8 (eight) hours as needed for severe pain. 08/13/20  Yes Farrel Gordon, PA-C  tamsulosin (FLOMAX) 0.4 MG CAPS capsule Take 1 capsule (0.4 mg total) by mouth daily. 08/13/20  Yes Farrel Gordon, PA-C  ALPRAZolam Prudy Feeler) 0.5 MG tablet Take 1 Dose by mouth 2 (two) times daily.    [provider]  cetirizine (ZYRTEC ALLERGY) 10 MG tablet Take 1 tablet (10 mg total) by mouth daily. 11/19/19   Avegno, Zachery Dakins, FNP  gabapentin  (NEURONTIN) 300 MG capsule Take 1 capsule by mouth as needed.    [provider]  levonorgestrel (MIRENA, 52 MG,) 20 MCG/24HR IUD Mirena 20 mcg/24 hr (5 years) intrauterine device  Take 1 device by intrauterine route.    [provider]    Allergies    Dilaudid [hydromorphone hcl]  Review of Systems   Review of Systems  Constitutional: Positive for unexpected weight change. Negative for chills, diaphoresis, fatigue and fever.  HENT: Negative for congestion, sore throat and trouble swallowing.   Eyes: Negative for pain and visual disturbance.  Respiratory: Negative for cough, shortness of breath and wheezing.   Cardiovascular: Negative for chest pain, palpitations and leg swelling.  Gastrointestinal: Positive for abdominal pain. Negative for abdominal distention, diarrhea, nausea and vomiting.  Genitourinary: Negative for difficulty urinating.  Musculoskeletal:  Negative for back pain, neck pain and neck stiffness.  Skin: Negative for pallor.  Neurological: Negative for dizziness, speech difficulty, weakness and headaches.  Psychiatric/Behavioral: Negative for confusion.    Physical Exam Updated Vital Signs BP (!) 133/96 (BP Location: Right Arm)   Pulse 90   Temp 98.1 F (36.7 C) (Oral)   Resp 18   Ht 5\' 3"  (1.6 m)   Wt 77.6 kg   SpO2 98%   BMI 30.29 kg/m   Physical Exam Constitutional:      General: She is not in acute distress.    Appearance: Normal appearance. She is not ill-appearing, toxic-appearing or diaphoretic.  HENT:     Mouth/Throat:     Mouth: Mucous membranes are moist.     Pharynx: Oropharynx is clear.  Eyes:     General: No scleral icterus.    Extraocular Movements: Extraocular movements intact.     Pupils: Pupils are equal, round, and reactive to light.  Cardiovascular:     Rate and Rhythm: Normal rate and regular rhythm.     Pulses: Normal pulses.     Heart sounds: Normal heart sounds.  Pulmonary:     Effort: Pulmonary effort is  normal. No respiratory distress.     Breath sounds: Normal breath sounds. No stridor. No wheezing, rhonchi or rales.  Chest:     Chest wall: No tenderness.  Abdominal:     General: Abdomen is flat. There is no distension.     Palpations: Abdomen is soft.     Tenderness: There is generalized abdominal tenderness and tenderness in the right lower quadrant. There is guarding. There is no rebound. Positive signs include McBurney's sign. Negative signs include Murphy's sign and obturator sign.     Comments: Patient with generalized abdominal tenderness in each quadrant, however worse in the right lower quadrant with some guarding in this area.  Musculoskeletal:        General: No swelling or tenderness. Normal range of motion.     Cervical back: Normal range of motion and neck supple. No rigidity.     Right lower leg: No edema.     Left lower leg: No edema.  Skin:    General: Skin is warm and dry.     Capillary Refill: Capillary refill takes less than 2 seconds.     Coloration: Skin is not pale.  Neurological:     General: No focal deficit present.     Mental Status: She is alert and oriented to person, place, and time.  Psychiatric:        Mood and Affect: Mood normal.        Behavior: Behavior normal.     ED Results / Procedures / Treatments   Labs (all labs ordered are listed, but only abnormal results are displayed) Labs Reviewed  CBC WITH DIFFERENTIAL/PLATELET - Abnormal; Notable for the following components:      Result Value   Hemoglobin 15.1 (*)    All other components within normal limits  COMPREHENSIVE METABOLIC PANEL - Abnormal; Notable for the following components:   Potassium 3.2 (*)    All other components within normal limits  URINALYSIS, ROUTINE W REFLEX MICROSCOPIC - Abnormal; Notable for the following components:   APPearance HAZY (*)    Hgb urine dipstick MODERATE (*)    Leukocytes,Ua MODERATE (*)    Bacteria, UA RARE (*)    All other components within normal  limits  URINE CULTURE  LIPASE, BLOOD  PREGNANCY, URINE  EKG None  Radiology CT Abdomen Pelvis W Contrast  Result Date: 08/13/2020 CLINICAL DATA:  Abdominal pain, primarily right-sided EXAM: CT ABDOMEN AND PELVIS WITH CONTRAST TECHNIQUE: Multidetector CT imaging of the abdomen and pelvis was performed using the standard protocol following bolus administration of intravenous contrast. CONTRAST:  100mL OMNIPAQUE IOHEXOL 300 MG/ML  SOLN COMPARISON:  July 05, 2017 FINDINGS: Lower chest: Lung bases are clear. Hepatobiliary: There is a 4 mm focus of decreased attenuation in the right lobe of the liver consistent with either a small cyst or hamartoma. No other liver lesions evident the gallbladder wall is not appreciably thickened. There is no biliary duct dilatation. Pancreas: There is no pancreatic mass or inflammatory focus. Spleen: No splenic lesions are evident. Adrenals/Urinary Tract: Adrenals bilaterally appear normal. There is no evident renal mass on either side. There is slight fullness of the right renal collecting system with thickening of the right renal pelvis and ureteropelvic junction. There is a calculus at the junction of the right renal pelvis and ureteropelvic junction measuring 7 x 6 mm. There is edema involving portions of the proximal to mid right ureter. No more distal ureteral calculi identified on the right. There is no evident renal or ureteral calculus on the left. Urinary bladder is midline with wall thickness within normal limits. Stomach/Bowel: There is no appreciable bowel wall or mesenteric thickening. Terminal ileum appears normal. No evident bowel obstruction. There is no free air or portal venous air. Vascular/Lymphatic: There is no abdominal aortic aneurysm. No arterial vascular lesions evident. Major venous structures appear patent. There is no appreciable adenopathy in the abdomen or pelvis Reproductive: Uterus is anteverted. Intrauterine device is positioned in the  endometrium. No adnexal masses are evident. Other: No appendiceal region inflammation. No abscess or ascites evident abdomen or pelvis. Musculoskeletal: No blastic or lytic bone lesions. No abdominal wall or intramuscular lesions are appreciable. IMPRESSION: 1. There is a 7 x 6 mm calculus on the right at the junction of the right renal pelvis and ureteropelvic junction. Trace hydronephrosis on the right. There is thickening of the wall of the renal pelvis as well as the proximal ureter. Mild wall thickening and dilatation proximal to mid ureter on the right noted without additional calculus. Question recent passage of a second calculus on the right. Early pyelonephritis could present in this manner. Appropriate laboratory correlation advised. 2. No hydronephrosis on the left. Urinary bladder wall thickness normal. 3. No bowel wall thickening or bowel obstruction. No abscess in the abdomen or pelvis. No appendiceal region lesion evident. 4.  Intrauterine device positioned within the endometrium. Electronically Signed   By: Bretta BangWilliam  Woodruff III M.D.   On: 08/13/2020 15:19    Procedures Ultrasound ED Renal  Date/Time: 08/13/2020 6:06 PM Performed by: Farrel GordonPatel, Keno Caraway, PA-C Authorized by: Farrel GordonPatel, Tawonna Esquer, PA-C   Procedure details:    Indications: hydronephrosis     Technique:  R kidneyImages: archived Left kidney findings:    Mass: not identified     Nephrolithiasis: not identified     Renal stones: not identified     Hydronephrosis: none       Medications Ordered in ED Medications  potassium chloride SA (KLOR-CON) CR tablet 40 mEq (has no administration in time range)  fentaNYL (SUBLIMAZE) injection 50 mcg (50 mcg Intravenous Given 08/13/20 1218)  sodium chloride 0.9 % bolus 1,000 mL (0 mLs Intravenous Stopped 08/13/20 1328)  iohexol (OMNIPAQUE) 300 MG/ML solution 100 mL (100 mLs Intravenous Contrast Given 08/13/20 1441)  sodium chloride 0.9 %  bolus 1,000 mL (0 mLs Intravenous Stopped 08/13/20 1744)   morphine 4 MG/ML injection 6 mg (6 mg Intravenous Given 08/13/20 1630)    ED Course  I have reviewed the triage vital signs and the nursing notes.  Pertinent labs & imaging results that were available during my care of the patient were reviewed by me and considered in my medical decision making (see chart for details).    MDM Rules/Calculators/A&P                           MEILING HENDRIKS is a 45 y.o. female with past medical history of anxiety, IBS, migraines, nephrolithiasis that presents to the emergency department today for right lower quadrant abdominal pain.  Patient appears well, however does have right lower quadrant tenderness in addition to generalized abdominal tenderness.  Will obtain basic work-up and CT imaging at this time.  Initial interventions include fluids and fentanyl for pain.  States that she is not currently nauseous.  Work-up today with unremarkable CBC and BMP, lipase normal.  Urinalysis does show some moderate leukocytes and moderate hemoglobin, does not appear infected.  Did obtain quick pocus renal study, no abnormality seen by me on right kidney.  Will obtain CT scan.     CT renal study does show 6 x 7 mm stone at the UPJ with trace hydronephrosis.  No concerns for pyleo, no UTI symptoms.Patient states that she does have a urologist with [facility].  Pain has been controlled, patient has been getting fluids.  Patient remains to not be nauseous.  Symptomatic treatment discussed with patient, breakthrough pain medication given.  Patient to be discharged at this time, states that she will call urologist when she leaves here for follow-up.  She did have to have surgery to have stone removed last time.  Doubt need for further emergent work up at this time. I explained the diagnosis and have given explicit precautions to return to the ER including for any other new or worsening symptoms. The patient understands and accepts the medical plan as it's been dictated  and I have answered their questions. Discharge instructions concerning home care and prescriptions have been given. The patient is STABLE and is discharged to home in good condition.   Final Clinical Impression(s) / ED Diagnoses Final diagnoses:  Nephrolithiasis    Rx / DC Orders ED Discharge Orders         Ordered    tamsulosin (FLOMAX) 0.4 MG CAPS capsule  Daily        08/13/20 1714    oxyCODONE-acetaminophen (PERCOCET/ROXICET) 5-325 MG tablet  Every 8 hours PRN        08/13/20 1714           Farrel Gordon, PA-C 08/13/20 1808    Vanetta Mulders, MD 08/27/20 [phone]

## 2020-08-13 NOTE — Discharge Instructions (Signed)
You are seen today for kidney stone, your CT reading is below.  I want you to follow-up with your urologist as we discussed, take NSAIDs such as ibuprofen or Aleve for the pain, if the pain is severe I did prescribe you some Percocet for breakthrough pain.  You can also take the Flomax as we spoke about.  Make sure to stay hydrated this is very important.  Please use the attached instructions, if you have any new or worsening concerning symptoms please come back to the emergency department.  Please speak to your pharmacist about any medications prescribed today in regards to side effects or interactions, your prescribed a narcotic which can make you sleepy, do not drive or operate heavy machinery when using this substance.  Get help right away if: You have a fever or chills. You develop severe pain. You develop new abdominal pain. You faint. You are unable to urinate.  1. There is a 7 x 6 mm calculus on the right at the junction of the  right renal pelvis and ureteropelvic junction. Trace hydronephrosis  on the right. There is thickening of the wall of the renal pelvis as  well as the proximal ureter. Mild wall thickening and dilatation  proximal to mid ureter on the right noted without additional  calculus. Question recent passage of a second calculus on the right.  Early pyelonephritis could present in this manner. Appropriate  laboratory correlation advised.

## 2020-08-13 NOTE — ED Triage Notes (Signed)
Pt to er, pt states that she she is here for abd pain and flank pain, states that she has been having the pain for a bit, but it got much worse last night.  Pt denies vomiting, reports a little diarrhea.  States that she has a hx of ibs and kidney stones, states that this feels like a kidney stone.

## 2020-08-15 ENCOUNTER — Other Ambulatory Visit: Payer: Self-pay

## 2020-08-15 ENCOUNTER — Other Ambulatory Visit: Payer: Self-pay | Admitting: Urology

## 2020-08-15 ENCOUNTER — Other Ambulatory Visit (HOSPITAL_COMMUNITY)
Admission: RE | Admit: 2020-08-15 | Discharge: 2020-08-15 | Disposition: A | Discharge status: Home / Self Care | Payer: 59 | Source: Ambulatory Visit | Attending: Urology | Admitting: Urology

## 2020-08-15 DIAGNOSIS — N200 Calculus of kidney: Secondary | ICD-10-CM

## 2020-08-15 DIAGNOSIS — Z20822 Contact with and (suspected) exposure to covid-19: Secondary | ICD-10-CM | POA: Diagnosis not present

## 2020-08-15 DIAGNOSIS — Z01812 Encounter for preprocedural laboratory examination: Secondary | ICD-10-CM | POA: Insufficient documentation

## 2020-08-15 NOTE — Progress Notes (Signed)
There is a note with the patients order. I had to go ahead and release the order. I called her Dr.'s office, I left a message they never called me back. That I know of, I was in and out of the office.

## 2020-08-16 LAB — URINE CULTURE: Culture: <10000 — AB

## 2020-08-16 LAB — SARS CORONAVIRUS 2 (TAT 6-24 HRS): SARS Coronavirus 2: NEGATIVE

## 2020-08-18 ENCOUNTER — Ambulatory Visit (HOSPITAL_BASED_OUTPATIENT_CLINIC_OR_DEPARTMENT_OTHER)
Admission: RE | Admit: 2020-08-18 | Discharge: 2020-08-18 | Disposition: A | Discharge status: Home / Self Care | Payer: 59 | Attending: Urology | Admitting: Urology

## 2020-08-18 ENCOUNTER — Encounter (HOSPITAL_BASED_OUTPATIENT_CLINIC_OR_DEPARTMENT_OTHER): Payer: Self-pay | Admitting: Urology

## 2020-08-18 ENCOUNTER — Other Ambulatory Visit: Payer: Self-pay

## 2020-08-18 ENCOUNTER — Ambulatory Visit (HOSPITAL_COMMUNITY): Payer: 59

## 2020-08-18 ENCOUNTER — Encounter (HOSPITAL_COMMUNITY): Payer: Self-pay | Admitting: Emergency Medicine

## 2020-08-18 ENCOUNTER — Encounter (HOSPITAL_BASED_OUTPATIENT_CLINIC_OR_DEPARTMENT_OTHER)
Admission: RE | Disposition: A | Discharge status: Home / Self Care | Payer: Self-pay | Source: Home / Self Care | Attending: Urology

## 2020-08-18 DIAGNOSIS — Z87442 Personal history of urinary calculi: Secondary | ICD-10-CM | POA: Diagnosis not present

## 2020-08-18 DIAGNOSIS — Z885 Allergy status to narcotic agent status: Secondary | ICD-10-CM | POA: Insufficient documentation

## 2020-08-18 DIAGNOSIS — N201 Calculus of ureter: Secondary | ICD-10-CM | POA: Insufficient documentation

## 2020-08-18 DIAGNOSIS — F17210 Nicotine dependence, cigarettes, uncomplicated: Secondary | ICD-10-CM | POA: Diagnosis not present

## 2020-08-18 DIAGNOSIS — N200 Calculus of kidney: Secondary | ICD-10-CM

## 2020-08-18 HISTORY — PX: EXTRACORPOREAL SHOCK WAVE LITHOTRIPSY: SHX1557

## 2020-08-18 LAB — POCT PREGNANCY, URINE: Preg Test, Ur: NEGATIVE

## 2020-08-18 SURGERY — LITHOTRIPSY, ESWL
Anesthesia: LOCAL | Laterality: Right

## 2020-08-18 MED ORDER — DIPHENHYDRAMINE HCL 25 MG PO CAPS
25.0000 mg | ORAL_CAPSULE | ORAL | Status: AC
  Administered 2020-08-18: 25 mg via ORAL

## 2020-08-18 MED ORDER — CIPROFLOXACIN HCL 500 MG PO TABS
ORAL_TABLET | ORAL | Status: AC
  Filled 2020-08-18: qty 1

## 2020-08-18 MED ORDER — DIPHENHYDRAMINE HCL 25 MG PO CAPS
ORAL_CAPSULE | ORAL | Status: AC
  Filled 2020-08-18: qty 1

## 2020-08-18 MED ORDER — DIAZEPAM 5 MG PO TABS
10.0000 mg | ORAL_TABLET | ORAL | Status: AC
  Administered 2020-08-18: 10 mg via ORAL

## 2020-08-18 MED ORDER — CIPROFLOXACIN HCL 500 MG PO TABS
500.0000 mg | ORAL_TABLET | ORAL | Status: AC
  Administered 2020-08-18: 500 mg via ORAL

## 2020-08-18 MED ORDER — SODIUM CHLORIDE 0.9 % IV SOLN: INTRAVENOUS | Status: DC

## 2020-08-18 MED ORDER — DIAZEPAM 5 MG PO TABS
ORAL_TABLET | ORAL | Status: AC
  Filled 2020-08-18: qty 2

## 2020-08-18 NOTE — Brief Op Note (Signed)
08/18/2020  10:44 AM  PATIENT:  Leda Min  45 y.o. female  PRE-OPERATIVE DIAGNOSIS:  RIGHT URETEROPELVIC JUNCTION CALCULUS  POST-OPERATIVE DIAGNOSIS:  * No post-op diagnosis entered *  PROCEDURE:  Procedure(s) with comments: EXTRACORPOREAL SHOCK WAVE LITHOTRIPSY (ESWL) (Right) - 75 MINS  SURGEON:  Surgeon(s) and Role:    * Alexis Frock, MD - Primary  PHYSICIAN ASSISTANT:   ASSISTANTS: none   ANESTHESIA:   MAC  EBL:  minimal   BLOOD ADMINISTERED:none  DRAINS: none   LOCAL MEDICATIONS USED:  NONE  SPECIMEN:  No Specimen  DISPOSITION OF SPECIMEN:  N/A  COUNTS:  YES  TOURNIQUET:  * No tourniquets in log *  DICTATION: .Note written in paper chart  PLAN OF CARE: Discharge to home after PACU  PATIENT DISPOSITION:  Short Stay   Delay start of Pharmacological VTE agent (>24hrs) due to surgical blood loss or risk of bleeding: yes

## 2020-08-18 NOTE — H&P (Signed)
Brianna Davis is an 45 y.o. female.    Chief Complaint: Pre-Op RIGHT Shockwave Lithotripsy  HPI:   1 - RIGHT UPJ Stone - Rt 61mm UPJ stone with likely ball-valve intermitantn obstruction by ER CT 07/2020. Only additional stone is Rt punctate. Stone at L3-4 interspace by FU KUB.  Today "Brianna Davis" is seen to proceed with RIGHT shockwave lithotripsy for Rt UPJ sotne. NO interval fevers. Most recent UCX form ER non-clonal / scant. NO interavl fevers. C19 screen negative.   Past Medical History:  Diagnosis Date  . Anxiety   . Degenerative disc disease   . Degenerative disc disease, lumbar   . IBS (irritable bowel syndrome)   . Migraine   . Nephrolithiasis   . S/P endoscopy April 2012   Dr. Jena Gauss: friability at GE junction, negative Barrett's orH.pylori, diffuse gastric petechia    Past Surgical History:  Procedure Laterality Date  . ARTHROTOMY Left 01/24/2020   Procedure: ARTHROTOMY;  Surgeon: Bjorn Pippin, MD;  Location: [facility];  Service: Orthopedics;  Laterality: Left;  . CESAREAN SECTION N/A 06/07/2014   Procedure: CESAREAN SECTION;  Surgeon: Genia Del, MD;  Location: [facility];  Service: Obstetrics;  Laterality: N/A;  . CHROMOPERTUBATION Bilateral 04/03/2013   Procedure: CHROMOPERTUBATION with Methylene Blue, with patent right tube and proximal obstruction of left tube;  Surgeon: Genia Del, MD;  Location: [facility];  Service: Gynecology;  Laterality: Bilateral;  . CYSTOSCOPY W/ URETERAL STENT PLACEMENT Left 12/25/2016   Procedure: CYSTOSCOPY WITH LEFT RETROGRADE PYELOGRAM/LEFT STONE EXTRACTION/LEFT URETERAL STENT PLACEMENT;  Surgeon: Marcine Matar, MD;  Location: [facility];  Service: Urology;  Laterality: Left;  . ESOPHAGOGASTRODUODENOSCOPY  09/30/2010   UJW:JXBJY hiatal hernia/Friability at the gastroesophageal junction with undulating Z-line versus short segment Barrett esophagus s/p bx  . ileocolonoscopy  12/2007   Friable anal canal.  .  LAPAROSCOPY N/A 04/03/2013   Procedure: LAPAROSCOPY DIAGNOSTIC;  Surgeon: Genia Del, MD;  Location: [facility];  Service: Gynecology;  Laterality: N/A;  . LIGAMENT REPAIR Left 01/24/2020   Procedure: REPAIR LATERAL COLLATERAL LIGAMENT ELBOW WITH LOCAL TISSUE, ARTHROTOMY ELBOW WITH JOINT EXPLORATION WITHOUT BIOPSY WITH REMOVAL OF LOOSE BODY, MICROFRACTURE;  Surgeon: Bjorn Pippin, MD;  Location: [facility];  Service: Orthopedics;  Laterality: Left;  . ROBOTIC ASSISTED LAPAROSCOPIC LYSIS OF ADHESION N/A 04/03/2013   Procedure: ROBOTIC ASSISTED LAPAROSCOPIC LYSIS OF ADHESION; LYSIS OF ENDOMETRIOSIS ;  Surgeon: Genia Del, MD;  Location: [facility];  Service: Gynecology;  Laterality: N/A;    Family History  Problem Relation Age of Onset  . Irritable bowel syndrome Mother   . GI problems Sister        gastroparesis  . Colon cancer Neg Hx   . Inflammatory bowel disease Neg Hx   . Liver disease Neg Hx    Social History:  reports that she has been smoking cigarettes. She started smoking about 24 years ago. She has a 7.00 pack-year smoking history. She has never used smokeless tobacco. She reports that she does not drink alcohol and does not use drugs.  Allergies:  Allergies  Allergen Reactions  . Dilaudid [Hydromorphone Hcl] Nausea And Vomiting    Severe nausea and vomiting    No medications prior to admission.    No results found for this or any previous visit (from the past 48 hour(s)). No results found.  Review of Systems  Constitutional: Negative for chills and fever.  Genitourinary: Positive for flank pain.  All other systems reviewed and are  negative.   Height 5\' 3"  (1.6 m), currently breastfeeding. Physical Exam HENT:     Head: Normocephalic.  Eyes:     Pupils: Pupils are equal, round, and reactive to light.  Cardiovascular:     Rate and Rhythm: Normal rate.  Pulmonary:     Effort: Pulmonary effort is normal.  Abdominal:     Comments: Mild truncal  obesity.   Genitourinary:    Comments: Very mild Rt CVAT at present.  Musculoskeletal:        General: Normal range of motion.  Neurological:     General: No focal deficit present.     Mental Status: She is alert.  Psychiatric:        Mood and Affect: Mood normal.      Assessment/Plan  Proceed as planned with RIGHT shockwave lithotripsy. Risks, benefits, altenratives, expected peri-treatemnt course discussed previously and reiterated today.   , MD 08/18/2020, 5:40 AM

## 2020-08-18 NOTE — ED Triage Notes (Signed)
Pt c/o bilateral flank pain increased tonight around 2000. Pt states she took a percocet around 2100

## 2020-08-18 NOTE — Discharge Instructions (Signed)
Lithotripsy, Care After °This sheet gives you information about how to care for yourself after your procedure. Your health care provider may also give you more specific instructions. If you have problems or questions, contact your health care provider. °What can I expect after the procedure? °After the procedure, it is common to have: °· Some blood in your urine. This should only last for a few days. °· Soreness in your back, sides, or upper abdomen for a few days. °· Blotches or bruises on the area where the shock wave entered the skin. °· Pain, discomfort, or nausea when pieces (fragments) of the kidney stone move through the tube that carries urine from the kidney to the bladder (ureter). Stone fragments may pass soon after the procedure, but they may continue to pass for up to 4-8 weeks. °? If you have severe pain or nausea, contact your health care provider. This may be caused by a large stone that was not broken up, and this may mean that you need more treatment. °· Some pain or discomfort during urination. °· Some pain or discomfort in the lower abdomen or (in men) at the base of the penis. °Follow these instructions at home: °Medicines °· Take over-the-counter and prescription medicines only as told by your health care provider. °· If you were prescribed an antibiotic medicine, take it as told by your health care provider. Do not stop taking the antibiotic even if you start to feel better. °· Ask your health care provider if the medicine prescribed to you requires you to avoid driving or using machinery. °Eating and drinking °· Drink enough fluid to keep your urine pale yellow. This helps any remaining pieces of the stone to pass. It can also help prevent new stones from forming. °· Eat plenty of fresh fruits and vegetables. °· Follow instructions from your health care provider about eating or drinking restrictions. You may be instructed to: °? Reduce how much salt (sodium) you eat or drink. Check  ingredients and nutrition facts on packaged foods and beverages to see how much sodium they contain. °? Reduce how much meat you eat. °· Eat the recommended amount of calcium for your age and gender. Ask your health care provider how much calcium you should have.  °  °  °General instructions °· Get plenty of rest. °· Return to your normal activities as told by your health care provider. Ask your health care provider what activities are safe for you. Most people can resume normal activities 1-2 days after the procedure. °· If you were given a sedative during the procedure, it can affect you for several hours. Do not drive or operate machinery until your health care provider says that it is safe. °· Your health care provider may direct you to lie in a certain position (postural drainage) and tap firmly (percuss) over your kidney area to help stone fragments pass. Follow instructions as told by your health care provider. °· If directed, strain all urine through the strainer that was provided by your health care provider. °? Keep all fragments for your health care provider to see. Any stones that are found may be sent to a medical lab for examination. The stone may be as small as a grain of salt. °· Keep all follow-up visits as told by your health care provider. This is important. °Contact a health care provider if: °· You have a fever or chills. °· You have nausea that is severe or does not go away. °· You have   any of these urinary symptoms: °? Blood in your urine for longer than your health care provider told you to expect. °? Urine that smells bad or unusual. °? Feeling a strong urge to urinate after emptying your bladder. °? Pain or burning with urination that does not go away. °? Urinating more often than usual and this does not go away. °· You have a stent and it comes out. °Get help right away if: °· You have severe pain in your back, sides, or upper abdomen. °· You have any of these urinary symptoms: °? Severe  pain while urinating. °? More blood in your urine or having blood in your urine when you did not before. °? Passing blood clots in your urine. °? Passing only a small amount of urine or being unable to pass any urine at all. °· You have severe nausea that leads to persistent vomiting. °· You faint. °Summary °· After this procedure, it is common to have some pain, discomfort, or nausea when pieces (fragments) of the kidney stone move through the tube that carries urine from the kidney to the bladder (ureter). If this pain or nausea is severe, however, you should contact your health care provider. °· Return to your normal activities as told by your health care provider. Ask your health care provider what activities are safe for you. °· Drink enough fluid to keep your urine pale yellow. This helps any remaining pieces of the stone to pass, and it can help prevent new stones from forming. °· If directed, strain your urine and keep all fragments for your health care provider to see. Fragments or stones may be as small as a grain of salt. °· Get help right away if you have severe pain in your back, sides, or upper abdomen, or if you have severe pain while urinating. °This information is not intended to replace advice given to you by your health care provider. Make sure you discuss any questions you have with your health care provider. °Document Revised: 03/21/2019 Document Reviewed: 03/21/2019 °Elsevier Patient Education © 2021 [employer] °1 - You may have urinary urgency (bladder spasms, pass small stone fragments,  and bloody urine on / off for up to 3 weeks. This is normal. ° °2 - Call MD or go to ER for fever >102, severe pain / nausea / vomiting not relieved by medications, or acute change in medical status ° °

## 2020-08-19 ENCOUNTER — Encounter (HOSPITAL_BASED_OUTPATIENT_CLINIC_OR_DEPARTMENT_OTHER): Payer: Self-pay | Admitting: Urology

## 2020-08-19 ENCOUNTER — Emergency Department (HOSPITAL_COMMUNITY)
Admission: EM | Admit: 2020-08-19 | Discharge: 2020-08-19 | Disposition: A | Discharge status: Home / Self Care | Payer: 59 | Source: Home / Self Care | Attending: Emergency Medicine | Admitting: Emergency Medicine

## 2020-08-19 DIAGNOSIS — N200 Calculus of kidney: Secondary | ICD-10-CM

## 2020-08-19 LAB — CBC
HCT: 40.2 % (ref 36.0–46.0)
Hemoglobin: 13.8 g/dL (ref 12.0–15.0)
MCH: 32.1 pg (ref 26.0–34.0)
MCHC: 34.3 g/dL (ref 30.0–36.0)
MCV: 93.5 fL (ref 80.0–100.0)
Platelets: 362 10*3/uL (ref 150–400)
RBC: 4.3 MIL/uL (ref 3.87–5.11)
RDW: 12.2 % (ref 11.5–15.5)
WBC: 8.8 10*3/uL (ref 4.0–10.5)
nRBC: 0 % (ref 0.0–0.2)

## 2020-08-19 LAB — URINALYSIS, ROUTINE W REFLEX MICROSCOPIC
Bilirubin Urine: NEGATIVE
Glucose, UA: NEGATIVE mg/dL
Ketones, ur: NEGATIVE mg/dL
Nitrite: NEGATIVE
Protein, ur: 30 mg/dL — AB
RBC / HPF: >50 RBC/hpf — ABNORMAL HIGH (ref 0–5)
Specific Gravity, Urine: 1.009 (ref 1.005–1.030)
pH: 8 (ref 5.0–8.0)

## 2020-08-19 LAB — BASIC METABOLIC PANEL
Anion gap: 10 (ref 5–15)
BUN: 8 mg/dL (ref 6–20)
CO2: 22 mmol/L (ref 22–32)
Calcium: 9.2 mg/dL (ref 8.9–10.3)
Chloride: 105 mmol/L (ref 98–111)
Creatinine, Ser: 0.81 mg/dL (ref 0.44–1.00)
GFR, Estimated: >60 mL/min (ref >60)
Glucose, Bld: 111 mg/dL — ABNORMAL HIGH (ref 70–99)
Potassium: 3.4 mmol/L — ABNORMAL LOW (ref 3.5–5.1)
Sodium: 137 mmol/L (ref 135–145)

## 2020-08-19 MED ORDER — MORPHINE SULFATE (PF) 4 MG/ML IV SOLN
4.0000 mg | Freq: Once | INTRAVENOUS | Status: AC
  Administered 2020-08-19: 4 mg via INTRAVENOUS
  Filled 2020-08-19: qty 1

## 2020-08-19 MED ORDER — ONDANSETRON HCL 4 MG/2ML IJ SOLN
4.0000 mg | Freq: Once | INTRAMUSCULAR | Status: AC
  Administered 2020-08-19: 4 mg via INTRAVENOUS
  Filled 2020-08-19: qty 2

## 2020-08-19 MED ORDER — KETOROLAC TROMETHAMINE 15 MG/ML IJ SOLN
15.0000 mg | Freq: Once | INTRAMUSCULAR | Status: AC
  Administered 2020-08-19: 15 mg via INTRAVENOUS
  Filled 2020-08-19: qty 1

## 2020-08-19 MED ORDER — SODIUM CHLORIDE 0.9 % IV BOLUS
1000.0000 mL | Freq: Once | INTRAVENOUS | Status: AC
  Administered 2020-08-19: 1000 mL via INTRAVENOUS

## 2020-08-19 NOTE — Discharge Instructions (Addendum)
You were evaluated in the Emergency Department and after careful evaluation, we did not find any emergent condition requiring admission or further testing in the hospital.  Your exam/testing today was overall reassuring.  Suspect that your pain was related to your recent kidney stone.  [facility] today to see what kind of follow-up they recommend.  We have messaged Dr. Berneice Heinrich who should be aware of your ED visit.  We recommend Tylenol 1000 mg every 4-6 hours, Motrin 400 mg every 4-6 hours, and use of the oxycodone as needed for more significant pain.  Please return to the Emergency Department if you experience any worsening of your condition.  Thank you for allowing [place] to be a part of your care.

## 2020-08-19 NOTE — ED Provider Notes (Signed)
[facility]-EMERGENCY DEPT [facility] Emergency Department Provider Note MRN:  [id number]  Arrival date & time: 08/19/20     Chief Complaint   Flank Pain   History of Present Illness   Brianna Davis is a 45 y.o. year-old female with a history of kidney stones presenting to the ED with chief complaint of flank pain.  Location: Left-sided flank Duration: 4 hours Onset: Sudden Timing: Constant Description: Sharp Severity: Severe Exacerbating/Alleviating Factors: None, pain started randomly.  Patient had lithotripsy earlier today. Associated Symptoms: Nausea, also some mild left-sided flank pain Pertinent Negatives: Denies fever, no dysuria, no hematuria, no chest pain or shortness of breath   Review of Systems  A complete 10 system review of systems was obtained and all systems are negative except as noted in the HPI and PMH.   Patient's Health History    Past Medical History:  Diagnosis Date  . Anxiety   . Degenerative disc disease   . Degenerative disc disease, lumbar   . IBS (irritable bowel syndrome)   . Migraine   . Nephrolithiasis   . S/P endoscopy April 2012   Dr. Jena Gauss: friability at GE junction, negative Barrett's orH.pylori, diffuse gastric petechia    Past Surgical History:  Procedure Laterality Date  . ARTHROTOMY Left 01/24/2020   Procedure: ARTHROTOMY;  Surgeon: Bjorn Pippin, MD;  Location: [facility];  Service: Orthopedics;  Laterality: Left;  . CESAREAN SECTION N/A 06/07/2014   Procedure: CESAREAN SECTION;  Surgeon: Genia Del, MD;  Location: [facility];  Service: Obstetrics;  Laterality: N/A;  . CHROMOPERTUBATION Bilateral 04/03/2013   Procedure: CHROMOPERTUBATION with Methylene Blue, with patent right tube and proximal obstruction of left tube;  Surgeon: Genia Del, MD;  Location: [facility];  Service: Gynecology;  Laterality: Bilateral;  . CYSTOSCOPY W/ URETERAL STENT PLACEMENT Left 12/25/2016   Procedure: CYSTOSCOPY WITH LEFT  RETROGRADE PYELOGRAM/LEFT STONE EXTRACTION/LEFT URETERAL STENT PLACEMENT;  Surgeon: Marcine Matar, MD;  Location: [facility];  Service: Urology;  Laterality: Left;  . ESOPHAGOGASTRODUODENOSCOPY  09/30/2010   OYD:XAJOI hiatal hernia/Friability at the gastroesophageal junction with undulating Z-line versus short segment Barrett esophagus s/p bx  . ileocolonoscopy  12/2007   Friable anal canal.  . LAPAROSCOPY N/A 04/03/2013   Procedure: LAPAROSCOPY DIAGNOSTIC;  Surgeon: Genia Del, MD;  Location: [facility];  Service: Gynecology;  Laterality: N/A;  . LIGAMENT REPAIR Left 01/24/2020   Procedure: REPAIR LATERAL COLLATERAL LIGAMENT ELBOW WITH LOCAL TISSUE, ARTHROTOMY ELBOW WITH JOINT EXPLORATION WITHOUT BIOPSY WITH REMOVAL OF LOOSE BODY, MICROFRACTURE;  Surgeon: Bjorn Pippin, MD;  Location: [facility];  Service: Orthopedics;  Laterality: Left;  . ROBOTIC ASSISTED LAPAROSCOPIC LYSIS OF ADHESION N/A 04/03/2013   Procedure: ROBOTIC ASSISTED LAPAROSCOPIC LYSIS OF ADHESION; LYSIS OF ENDOMETRIOSIS ;  Surgeon: Genia Del, MD;  Location: [facility];  Service: Gynecology;  Laterality: N/A;    Family History  Problem Relation Age of Onset  . Irritable bowel syndrome Mother   . GI problems Sister        gastroparesis  . Colon cancer Neg Hx   . Inflammatory bowel disease Neg Hx   . Liver disease Neg Hx     Social History   Socioeconomic History  . Marital status: Married    Spouse name: Not on file  . Number of children: 0  . Years of education: Not on file  . Highest education level: Not on file  Occupational History    Employer: [employer]    Comment: works from  home  Tobacco Use  . Smoking status: Current Every Day Smoker    Packs/day: 0.50    Years: 14.00    Pack years: 7.00    Types: Cigarettes    Start date: 03/08/1996  . Smokeless tobacco: Never Used  Vaping Use  . Vaping Use: Never used  Substance and Sexual Activity  . Alcohol use: No    Alcohol/week: 0.0  standard drinks  . Drug use: No  . Sexual activity: Yes    Partners: Male    Birth control/protection: I.U.D.    Comment: spouse  Other Topics Concern  . Not on file  Social History Narrative  . Not on file   Social Determinants of Health   Financial Resource Strain: Not on file  Food Insecurity: Not on file  Transportation Needs: Not on file  Physical Activity: Not on file  Stress: Not on file  Social Connections: Not on file  Intimate Partner Violence: Not on file     Physical Exam   Vitals:   08/19/20 0300 08/19/20 0520  BP: 114/70 123/83  Pulse: 63 60  Resp: 16 17  Temp:    SpO2: 100% 98%    CONSTITUTIONAL: Well-appearing, NAD NEURO:  Alert and oriented x 3, no focal deficits EYES:  eyes equal and reactive ENT/NECK:  no LAD, no JVD CARDIO: Regular rate, well-perfused, normal S1 and S2 PULM:  CTAB no wheezing or rhonchi GI/GU:  normal bowel sounds, non-distended, moderate right CVA tenderness MSK/SPINE:  No gross deformities, no edema SKIN:  no rash, atraumatic PSYCH:  Appropriate speech and behavior  *Additional and/or pertinent findings included in MDM below  Diagnostic and Interventional Summary    EKG Interpretation  Date/Time:    Ventricular Rate:    PR Interval:    QRS Duration:   QT Interval:    QTC Calculation:   R Axis:     Text Interpretation:        Labs Reviewed  BASIC METABOLIC PANEL - Abnormal; Notable for the following components:      Result Value   Potassium 3.4 (*)    Glucose, Bld 111 (*)    All other components within normal limits  URINALYSIS, ROUTINE W REFLEX MICROSCOPIC - Abnormal; Notable for the following components:   APPearance HAZY (*)    Hgb urine dipstick LARGE (*)    Protein, ur 30 (*)    Leukocytes,Ua TRACE (*)    RBC / HPF >50 (*)    Bacteria, UA RARE (*)    All other components within normal limits  CBC    No orders to display    Medications  sodium chloride 0.9 % bolus 1,000 mL (0 mLs Intravenous  Stopped 08/19/20 0326)  ondansetron (ZOFRAN) injection 4 mg (4 mg Intravenous Given 08/19/20 0105)  morphine 4 MG/ML injection 4 mg (4 mg Intravenous Given 08/19/20 0105)  ketorolac (TORADOL) 15 MG/ML injection 15 mg (15 mg Intravenous Given 08/19/20 0105)     Procedures  /  Critical Care Procedures  ED Course and Medical Decision Making  I have reviewed the triage vital signs, the nursing notes, and pertinent available records from the EMR.  Listed above are laboratory and imaging tests that I personally ordered, reviewed, and interpreted and then considered in my medical decision making (see below for details).  Known 7 mm kidney stone on the right, underwent lithotripsy earlier today.  Worsening pain this evening.  Suspect stone has shifted in someway.  Passing some kidney stones in her  urine here in the emergency department.  Providing pain control, evaluating with labs, urinalysis.  Will attempt to hold off on repeat CT imaging given the lack of diagnostic uncertainty.     Since passing stones in her urine here in the emergency department, patient has been pain-free.  Labs and urine are reassuring.  We observe the patient here in the emergency department for a few more hours with no return of pain.  Currently there seems to be no benefit to imaging or further testing or admission.  Patient plans to call the urology office today.  I have messaged Dr. Berneice Heinrich to ensure close follow-up.  Elmer Sow. Pilar Plate, MD [facility] Emergency Medicine [facility] [email or web address]  Final Clinical Impressions(s) / ED Diagnoses     ICD-10-CM   1. Kidney stone  N20.0     ED Discharge Orders    None       Discharge Instructions Discussed with and Provided to Patient:     Discharge Instructions     You were evaluated in the Emergency Department and after careful evaluation, we did not find any emergent condition requiring admission or further testing in the hospital.  Your  exam/testing today was overall reassuring.  Suspect that your pain was related to your recent kidney stone.  Please contact the [facility] today to see what kind of follow-up they recommend.  We have messaged Dr. Berneice Heinrich who should be aware of your ED visit.  We recommend Tylenol 1000 mg every 4-6 hours, Motrin 400 mg every 4-6 hours, and use of the oxycodone as needed for more significant pain.  Please return to the Emergency Department if you experience any worsening of your condition.  Thank you for allowing [place] to be a part of your care.        Sabas Sous, MD 08/19/20 [phone]

## 2021-03-15 ENCOUNTER — Other Ambulatory Visit: Payer: Self-pay

## 2021-03-15 ENCOUNTER — Ambulatory Visit
Admission: EM | Admit: 2021-03-15 | Discharge: 2021-03-15 | Disposition: A | Discharge status: Home / Self Care | Payer: 59 | Attending: Emergency Medicine | Admitting: Emergency Medicine

## 2021-03-15 DIAGNOSIS — J209 Acute bronchitis, unspecified: Secondary | ICD-10-CM

## 2021-03-15 MED ORDER — ALBUTEROL SULFATE HFA 108 (90 BASE) MCG/ACT IN AERS: 1.0000 | INHALATION_SPRAY | Freq: Four times a day (QID) | RESPIRATORY_TRACT | 0 refills | Status: DC | PRN

## 2021-03-15 MED ORDER — PREDNISONE 10 MG (21) PO TBPK: ORAL_TABLET | Freq: Every day | ORAL | 0 refills | Status: DC

## 2021-03-15 MED ORDER — AZITHROMYCIN 250 MG PO TABS: 250.0000 mg | ORAL_TABLET | Freq: Every day | ORAL | 0 refills | Status: DC

## 2021-03-15 NOTE — ED Provider Notes (Signed)
[facility]   [id number] 03/15/21 Arrival Time: 1017   Chief Complaint  Patient presents with   Bronchitis      SUBJECTIVE: History from: patient.  Brianna Davis is a 45 y.o. female presented to the urgent care for complaint of cough for the past 2 weeks.  Denies sick exposure to COVID, flu or strep.  Denies recent travel.  Has tried OTC medication without relief.  No alleviating or aggravating factors.  Denies previous symptoms in the past.   Denies fever, chills, fatigue, sinus pain, rhinorrhea, sore throat, SOB, wheezing, chest pain, nausea, changes in bowel or bladder habits.     ROS: As per HPI.  All other pertinent ROS negative.      Past Medical History:  Diagnosis Date   Anxiety    Degenerative disc disease    Degenerative disc disease, lumbar    IBS (irritable bowel syndrome)    Migraine    Nephrolithiasis    S/P endoscopy April 2012   Dr. Jena Gauss: friability at GE junction, negative Barrett's orH.pylori, diffuse gastric petechia   Past Surgical History:  Procedure Laterality Date   ARTHROTOMY Left 01/24/2020   Procedure: ARTHROTOMY;  Surgeon: Bjorn Pippin, MD;  Location: [facility];  Service: Orthopedics;  Laterality: Left;   CESAREAN SECTION N/A 06/07/2014   Procedure: CESAREAN SECTION;  Surgeon: Genia Del, MD;  Location: [facility];  Service: Obstetrics;  Laterality: N/A;   CHROMOPERTUBATION Bilateral 04/03/2013   Procedure: CHROMOPERTUBATION with Methylene Blue, with patent right tube and proximal obstruction of left tube;  Surgeon: Genia Del, MD;  Location: [facility];  Service: Gynecology;  Laterality: Bilateral;   CYSTOSCOPY W/ URETERAL STENT PLACEMENT Left 12/25/2016   Procedure: CYSTOSCOPY WITH LEFT RETROGRADE PYELOGRAM/LEFT STONE EXTRACTION/LEFT URETERAL STENT PLACEMENT;  Surgeon: Marcine Matar, MD;  Location: [facility];  Service: Urology;  Laterality: Left;   ESOPHAGOGASTRODUODENOSCOPY  09/30/2010   MBE:MLJQG hiatal  hernia/Friability at the gastroesophageal junction with undulating Z-line versus short segment Barrett esophagus s/p bx   EXTRACORPOREAL SHOCK WAVE LITHOTRIPSY Right 08/18/2020   Procedure: EXTRACORPOREAL SHOCK WAVE LITHOTRIPSY (ESWL);  Surgeon: Sebastian Ache, MD;  Location: [facility];  Service: Urology;  Laterality: Right;  75 MINS   ileocolonoscopy  12/2007   Friable anal canal.   LAPAROSCOPY N/A 04/03/2013   Procedure: LAPAROSCOPY DIAGNOSTIC;  Surgeon: Genia Del, MD;  Location: [facility];  Service: Gynecology;  Laterality: N/A;   LIGAMENT REPAIR Left 01/24/2020   Procedure: REPAIR LATERAL COLLATERAL LIGAMENT ELBOW WITH LOCAL TISSUE, ARTHROTOMY ELBOW WITH JOINT EXPLORATION WITHOUT BIOPSY WITH REMOVAL OF LOOSE BODY, MICROFRACTURE;  Surgeon: Bjorn Pippin, MD;  Location: [facility];  Service: Orthopedics;  Laterality: Left;   ROBOTIC ASSISTED LAPAROSCOPIC LYSIS OF ADHESION N/A 04/03/2013   Procedure: ROBOTIC ASSISTED LAPAROSCOPIC LYSIS OF ADHESION; LYSIS OF ENDOMETRIOSIS ;  Surgeon: Genia Del, MD;  Location: [facility];  Service: Gynecology;  Laterality: N/A;   Allergies  Allergen Reactions   Dilaudid [Hydromorphone Hcl] Nausea And Vomiting    Severe nausea and vomiting   No current facility-administered medications on file prior to encounter.   Current Outpatient Medications on File Prior to Encounter  Medication Sig Dispense Refill   ALPRAZolam (XANAX) 0.5 MG tablet Take 1 Dose by mouth 2 (two) times daily.     cetirizine (ZYRTEC ALLERGY) 10 MG tablet Take 1 tablet (10 mg total) by mouth daily. 30 tablet 0   gabapentin (NEURONTIN) 300 MG capsule Take 1 capsule by mouth  as needed.     levonorgestrel (MIRENA, 52 MG,) 20 MCG/24HR IUD Mirena 20 mcg/24 hr (5 years) intrauterine device  Take 1 device by intrauterine route.     oxyCODONE-acetaminophen (PERCOCET/ROXICET) 5-325 MG tablet Take 1 tablet by mouth every 8 (eight) hours as needed for severe pain.  5 tablet 0   tamsulosin (FLOMAX) 0.4 MG CAPS capsule Take 1 capsule (0.4 mg total) by mouth daily. 10 capsule 0   Social History   Socioeconomic History   Marital status: Married    Spouse name: Not on file   Number of children: 0   Years of education: Not on file   Highest education level: Not on file  Occupational History    Employer: Advertising copywriter    Comment: works from home  Tobacco Use   Smoking status: Every Day    Packs/day: 0.50    Years: 14.00    Pack years: 7.00    Types: Cigarettes    Start date: 03/08/1996   Smokeless tobacco: Never  Vaping Use   Vaping Use: Never used  Substance and Sexual Activity   Alcohol use: No    Alcohol/week: 0.0 standard drinks   Drug use: No   Sexual activity: Yes    Partners: Male    Birth control/protection: I.U.D.    Comment: spouse  Other Topics Concern   Not on file  Social History Narrative   Not on file   Social Determinants of Health   Financial Resource Strain: Not on file  Food Insecurity: Not on file  Transportation Needs: Not on file  Physical Activity: Not on file  Stress: Not on file  Social Connections: Not on file  Intimate Partner Violence: Not on file   Family History  Problem Relation Age of Onset   Irritable bowel syndrome Mother    GI problems Sister        gastroparesis   Colon cancer Neg Hx    Inflammatory bowel disease Neg Hx    Liver disease Neg Hx     OBJECTIVE:  Vitals:   03/15/21 1128  BP: (!) 154/91  Pulse: 84  Resp: 17  Temp: 97.7 F (36.5 C)  TempSrc: Tympanic  SpO2: 97%     General appearance: alert; appears fatigued, but nontoxic; speaking in full sentences and tolerating own secretions HEENT: NCAT; Ears: EACs clear, TMs pearly gray; Eyes: PERRL.  EOM grossly intact. Sinuses: nontender; Nose: nares patent without rhinorrhea, Throat: oropharynx clear, tonsils non erythematous or enlarged, uvula midline  Neck: supple without LAD Lungs: unlabored respirations, symmetrical  air entry; cough: moderate; no respiratory distress; CTAB Heart: regular rate and rhythm.  Radial pulses 2+ symmetrical bilaterally Skin: warm and dry Psychological: alert and cooperative; normal mood and affect  LABS:  No results found for this or any previous visit (from the past 24 hour(s)).   ASSESSMENT & PLAN:  1. Acute bronchitis, unspecified organism     Meds ordered this encounter  Medications   predniSONE (STERAPRED UNI-PAK 21 TAB) 10 MG (21) TBPK tablet    Sig: Take by mouth daily. Take 6 tabs by mouth daily  for 1 days, then 5 tabs for 1 days, then 4 tabs for 1 days, then 3 tabs for 1 days, 2 tabs for 1 days, then 1 tab by mouth daily for 1 days    Dispense:  21 tablet    Refill:  0   azithromycin (ZITHROMAX) 250 MG tablet    Sig: Take 1 tablet (250 mg total)  by mouth daily. Take first 2 tablets together, then 1 every day until finished.    Dispense:  6 tablet    Refill:  0   albuterol (VENTOLIN HFA) 108 (90 Base) MCG/ACT inhaler    Sig: Inhale 1-2 puffs into the lungs every 6 (six) hours as needed for wheezing or shortness of breath.    Dispense:  18 g    Refill:  0     Discharge Instructions     Get plenty of rest and push fluids Prescribed azithromycin Prescribed prednisone and albuterol/take as directed Use medications daily for symptom relief Use OTC medications like ibuprofen or tylenol as needed fever or pain Call or go to the ED if you have any new or worsening symptoms such as fever, worsening cough, shortness of breath, chest tightness, chest pain, turning blue, changes in mental status, etc...   Reviewed expectations re: course of current medical issues. Questions answered. Outlined signs and symptoms indicating need for more acute intervention. Patient verbalized understanding. After Visit Summary given.          Durward Parcel, FNP 03/15/21 1157

## 2021-03-15 NOTE — Discharge Instructions (Addendum)
Get plenty of rest and push fluids Prescribed azithromycin Prescribed prednisone and albuterol/take as directed Use medications daily for symptom relief Use OTC medications like ibuprofen or tylenol as needed fever or pain Call or go to the ED if you have any new or worsening symptoms such as fever, worsening cough, shortness of breath, chest tightness, chest pain, turning blue, changes in mental status, etc..Marland Kitchen

## 2021-03-15 NOTE — ED Triage Notes (Signed)
Patient presents to Urgent Care with complaints of cough x 1.5 weeks ago. Pt has a hx of bronchitis. Treating symptoms with halls and Claritin.   Denies fever.

## 2021-03-24 ENCOUNTER — Emergency Department (HOSPITAL_COMMUNITY): Payer: 59

## 2021-03-24 ENCOUNTER — Other Ambulatory Visit: Payer: Self-pay

## 2021-03-24 ENCOUNTER — Emergency Department (HOSPITAL_COMMUNITY)
Admission: EM | Admit: 2021-03-24 | Discharge: 2021-03-24 | Discharge status: Against Medical Advice | Payer: 59 | Attending: Emergency Medicine | Admitting: Emergency Medicine

## 2021-03-24 ENCOUNTER — Encounter (HOSPITAL_COMMUNITY): Payer: Self-pay

## 2021-03-24 DIAGNOSIS — F17210 Nicotine dependence, cigarettes, uncomplicated: Secondary | ICD-10-CM | POA: Diagnosis not present

## 2021-03-24 DIAGNOSIS — Z79899 Other long term (current) drug therapy: Secondary | ICD-10-CM | POA: Diagnosis not present

## 2021-03-24 DIAGNOSIS — R0789 Other chest pain: Secondary | ICD-10-CM | POA: Insufficient documentation

## 2021-03-24 LAB — HEPATIC FUNCTION PANEL
ALT: 12 U/L (ref 0–44)
AST: 15 U/L (ref 15–41)
Albumin: 3.6 g/dL (ref 3.5–5.0)
Alkaline Phosphatase: 76 U/L (ref 38–126)
Bilirubin, Direct: 0.1 mg/dL (ref 0.0–0.2)
Indirect Bilirubin: 0.8 mg/dL (ref 0.3–0.9)
Total Bilirubin: 0.9 mg/dL (ref 0.3–1.2)
Total Protein: 6.5 g/dL (ref 6.5–8.1)

## 2021-03-24 LAB — CBC
HCT: 42.9 % (ref 36.0–46.0)
Hemoglobin: 14.3 g/dL (ref 12.0–15.0)
MCH: 31.7 pg (ref 26.0–34.0)
MCHC: 33.3 g/dL (ref 30.0–36.0)
MCV: 95.1 fL (ref 80.0–100.0)
Platelets: 320 10*3/uL (ref 150–400)
RBC: 4.51 MIL/uL (ref 3.87–5.11)
RDW: 12.7 % (ref 11.5–15.5)
WBC: 6.1 10*3/uL (ref 4.0–10.5)
nRBC: 0 % (ref 0.0–0.2)

## 2021-03-24 LAB — BASIC METABOLIC PANEL
Anion gap: 6 (ref 5–15)
BUN: 7 mg/dL (ref 6–20)
CO2: 28 mmol/L (ref 22–32)
Calcium: 8.9 mg/dL (ref 8.9–10.3)
Chloride: 104 mmol/L (ref 98–111)
Creatinine, Ser: 0.69 mg/dL (ref 0.44–1.00)
GFR, Estimated: >60 mL/min (ref >60)
Glucose, Bld: 79 mg/dL (ref 70–99)
Potassium: 3.4 mmol/L — ABNORMAL LOW (ref 3.5–5.1)
Sodium: 138 mmol/L (ref 135–145)

## 2021-03-24 LAB — TROPONIN I (HIGH SENSITIVITY)
Troponin I (High Sensitivity): <2 ng/L (ref <18)
Troponin I (High Sensitivity): <2 ng/L (ref <18)

## 2021-03-24 NOTE — ED Provider Notes (Signed)
[facility] EMERGENCY DEPARTMENT Provider Note   CSN: [id number] Arrival date & time: 03/24/21  6314     History Chief Complaint  Patient presents with  . Chest Pain    Brianna Davis is a 45 y.o. female.  Patient states that she been having chest discomfort off and on for a few weeks.  The history is provided by the patient and medical records. No language interpreter was used.  Chest Pain Pain location:  L chest Pain quality: aching   Pain radiates to:  Does not radiate Pain severity:  Mild Onset quality:  Sudden Timing:  Intermittent Progression:  Waxing and waning Chronicity:  New Context: not breathing   Relieved by:  Nothing Associated symptoms: no abdominal pain, no back pain, no cough, no fatigue and no headache       Past Medical History:  Diagnosis Date  . Anxiety   . Degenerative disc disease   . Degenerative disc disease, lumbar   . IBS (irritable bowel syndrome)   . Migraine   . Nephrolithiasis   . S/P endoscopy April 2012   Dr. Jena Gauss: friability at GE junction, negative Barrett's orH.pylori, diffuse gastric petechia    Patient Active Problem List   Diagnosis Date Noted  . Screening for viral disease 04/25/2019  . Ureteral calculus 12/25/2016  . RUQ abdominal pain 10/14/2016  . Nausea without vomiting 10/14/2016  . Generalized anxiety disorder 09/10/2016  . Postpartum care following cesarean delivery (12/19) 06/08/2014  . Postoperative state 06/08/2014  . Normal labor 06/06/2014  . Pregnancy 05/17/2014  . Gestational hypertension w/o significant proteinuria in 3rd trimester 05/15/2014  . Acute pyelonephritis 09/11/2012  . GERD (gastroesophageal reflux disease) 09/28/2010  . Epigastric pain 09/28/2010  . Dysphagia 09/28/2010  . IBS (irritable bowel syndrome) 09/28/2010  . Esophageal dysphagia 09/28/2010    Past Surgical History:  Procedure Laterality Date  . ARTHROTOMY Left 01/24/2020   Procedure: ARTHROTOMY;  Surgeon: Bjorn Pippin,  MD;  Location: [facility];  Service: Orthopedics;  Laterality: Left;  . CESAREAN SECTION N/A 06/07/2014   Procedure: CESAREAN SECTION;  Surgeon: Genia Del, MD;  Location: [facility];  Service: Obstetrics;  Laterality: N/A;  . CHROMOPERTUBATION Bilateral 04/03/2013   Procedure: CHROMOPERTUBATION with Methylene Blue, with patent right tube and proximal obstruction of left tube;  Surgeon: Genia Del, MD;  Location: [facility];  Service: Gynecology;  Laterality: Bilateral;  . CYSTOSCOPY W/ URETERAL STENT PLACEMENT Left 12/25/2016   Procedure: CYSTOSCOPY WITH LEFT RETROGRADE PYELOGRAM/LEFT STONE EXTRACTION/LEFT URETERAL STENT PLACEMENT;  Surgeon: Marcine Matar, MD;  Location: [facility];  Service: Urology;  Laterality: Left;  . ESOPHAGOGASTRODUODENOSCOPY  09/30/2010   HFW:YOVZC hiatal hernia/Friability at the gastroesophageal junction with undulating Z-line versus short segment Barrett esophagus s/p bx  . EXTRACORPOREAL SHOCK WAVE LITHOTRIPSY Right 08/18/2020   Procedure: EXTRACORPOREAL SHOCK WAVE LITHOTRIPSY (ESWL);  Surgeon: Sebastian Ache, MD;  Location: [facility];  Service: Urology;  Laterality: Right;  75 MINS  . ileocolonoscopy  12/2007   Friable anal canal.  . LAPAROSCOPY N/A 04/03/2013   Procedure: LAPAROSCOPY DIAGNOSTIC;  Surgeon: Genia Del, MD;  Location: [facility];  Service: Gynecology;  Laterality: N/A;  . LIGAMENT REPAIR Left 01/24/2020   Procedure: REPAIR LATERAL COLLATERAL LIGAMENT ELBOW WITH LOCAL TISSUE, ARTHROTOMY ELBOW WITH JOINT EXPLORATION WITHOUT BIOPSY WITH REMOVAL OF LOOSE BODY, MICROFRACTURE;  Surgeon: Bjorn Pippin, MD;  Location: [facility];  Service: Orthopedics;  Laterality: Left;  . ROBOTIC ASSISTED LAPAROSCOPIC LYSIS OF ADHESION  N/A 04/03/2013   Procedure: ROBOTIC ASSISTED LAPAROSCOPIC LYSIS OF ADHESION; LYSIS OF ENDOMETRIOSIS ;  Surgeon: Genia Del, MD;  Location: [facility];  Service: Gynecology;  Laterality: N/A;      OB History     Gravida  2   Para  1   Term  1   Preterm      AB  1   Living  1      SAB  1   IAB      Ectopic      Multiple  0   Live Births  1           Family History  Problem Relation Age of Onset  . Irritable bowel syndrome Mother   . GI problems Sister        gastroparesis  . Colon cancer Neg Hx   . Inflammatory bowel disease Neg Hx   . Liver disease Neg Hx     Social History   Tobacco Use  . Smoking status: Every Day    Packs/day: 0.50    Years: 14.00    Pack years: 7.00    Types: Cigarettes    Start date: 03/08/1996  . Smokeless tobacco: Never  Vaping Use  . Vaping Use: Never used  Substance Use Topics  . Alcohol use: No    Alcohol/week: 0.0 standard drinks  . Drug use: No    Home Medications Prior to Admission medications   Medication Sig Start Date End Date Taking? Authorizing Provider  levonorgestrel (MIRENA) 20 MCG/24HR IUD 1 each by Intrauterine route once.   Yes [provider]  lisinopril (ZESTRIL) 10 MG tablet Take 10 mg by mouth daily. 03/18/21  Yes [provider]  albuterol (VENTOLIN HFA) 108 (90 Base) MCG/ACT inhaler Inhale 1-2 puffs into the lungs every 6 (six) hours as needed for wheezing or shortness of breath. Patient not taking: No sig reported 03/15/21   Durward Parcel, FNP  azithromycin (ZITHROMAX) 250 MG tablet Take 1 tablet (250 mg total) by mouth daily. Take first 2 tablets together, then 1 every day until finished. Patient not taking: No sig reported 03/15/21   Durward Parcel, FNP  cetirizine (ZYRTEC ALLERGY) 10 MG tablet Take 1 tablet (10 mg total) by mouth daily. Patient not taking: No sig reported 11/19/19   Durward Parcel, FNP  oxyCODONE-acetaminophen (PERCOCET/ROXICET) 5-325 MG tablet Take 1 tablet by mouth every 8 (eight) hours as needed for severe pain. Patient not taking: No sig reported 08/13/20   Farrel Gordon, PA-C  predniSONE (STERAPRED UNI-PAK 21 TAB) 10 MG (21) TBPK  tablet Take by mouth daily. Take 6 tabs by mouth daily  for 1 days, then 5 tabs for 1 days, then 4 tabs for 1 days, then 3 tabs for 1 days, 2 tabs for 1 days, then 1 tab by mouth daily for 1 days Patient not taking: No sig reported 03/15/21   Durward Parcel, FNP  tamsulosin (FLOMAX) 0.4 MG CAPS capsule Take 1 capsule (0.4 mg total) by mouth daily. Patient not taking: No sig reported 08/13/20   Farrel Gordon, PA-C    Allergies    Dilaudid [hydromorphone hcl]  Review of Systems   Review of Systems  Constitutional:  Negative for appetite change and fatigue.  HENT:  Negative for congestion, ear discharge and sinus pressure.   Eyes:  Negative for discharge.  Respiratory:  Negative for cough.   Cardiovascular:  Positive for chest pain.  Gastrointestinal:  Negative for abdominal pain  and diarrhea.  Genitourinary:  Negative for frequency and hematuria.  Musculoskeletal:  Negative for back pain.  Skin:  Negative for rash.  Neurological:  Negative for seizures and headaches.  Psychiatric/Behavioral:  Negative for hallucinations.    Physical Exam Updated Vital Signs BP 125/89   Pulse 65   Temp 98 F (36.7 C) (Oral)   Resp (!) 21   Ht 5\' 3"  (1.6 m)   Wt 80.7 kg   SpO2 99%   BMI 31.53 kg/m   Physical Exam Vitals and nursing note reviewed.  Constitutional:      Appearance: She is well-developed.  HENT:     Head: Normocephalic.     Nose: Nose normal.  Eyes:     General: No scleral icterus.    Conjunctiva/sclera: Conjunctivae normal.  Neck:     Thyroid: No thyromegaly.  Cardiovascular:     Rate and Rhythm: Normal rate and regular rhythm.     Heart sounds: No murmur heard.   No friction rub. No gallop.  Pulmonary:     Breath sounds: No stridor. No wheezing or rales.  Chest:     Chest wall: No tenderness.  Abdominal:     General: There is no distension.     Tenderness: There is no abdominal tenderness. There is no rebound.  Musculoskeletal:        General: Normal range of  motion.     Cervical back: Neck supple.  Lymphadenopathy:     Cervical: No cervical adenopathy.  Skin:    Findings: No erythema or rash.  Neurological:     Mental Status: She is alert and oriented to person, place, and time.     Motor: No abnormal muscle tone.     Coordination: Coordination normal.  Psychiatric:        Behavior: Behavior normal.    ED Results / Procedures / Treatments   Labs (all labs ordered are listed, but only abnormal results are displayed) Labs Reviewed  BASIC METABOLIC PANEL - Abnormal; Notable for the following components:      Result Value   Potassium 3.4 (*)    All other components within normal limits  CBC  HEPATIC FUNCTION PANEL  POC URINE PREG, ED  TROPONIN I (HIGH SENSITIVITY)  TROPONIN I (HIGH SENSITIVITY)    EKG EKG Interpretation  Date/Time:  Tuesday March 24 2021 09:10:42 EDT Ventricular Rate:  84 PR Interval:  142 QRS Duration: 98 QT Interval:  366 QTC Calculation: 432 R Axis:   5 Text Interpretation: Normal sinus rhythm Normal ECG Confirmed by [id number] [phone]) on 03/24/2021 9:41:54 AM  Radiology DG Chest 2 View  Result Date: 03/24/2021 CLINICAL DATA:  Chest pain, chest tightness and trouble catching breath EXAM: CHEST - 2 VIEW COMPARISON:  Chest radiograph 07/14/2015 FINDINGS: The cardiomediastinal silhouette is normal. The lungs are well inflated. There is no focal consolidation or pulmonary edema. There is no pleural effusion or pneumothorax. There is no acute osseous abnormality. IMPRESSION: No radiographic evidence of acute cardiopulmonary process. Electronically Signed   By: 07/16/2015 M.D.   On: 03/24/2021 09:59    Procedures Procedures   Medications Ordered in ED Medications - No data to display  ED Course  I have reviewed the triage vital signs and the nursing notes.  Pertinent labs & imaging results that were available during my care of the patient were reviewed by me and considered in my medical decision  making (see chart for details).  Labs and EKG unremarkable.  Chest  pain negative.  Patient left AMA before I was able to discuss results of her test. MDM Rules/Calculators/A&P                           Patient with atypical chest discomfort and normal labs EKG and chest x-ray.  Patient left AMA Final Clinical Impression(s) / ED Diagnoses Final diagnoses:  Atypical chest pain    Rx / DC Orders ED Discharge Orders     None        Bethann Berkshire, MD 03/24/21 [phone]

## 2021-03-24 NOTE — ED Triage Notes (Signed)
Pt presents to ED with complaints of chest tightness and trouble catching her breath. Pt states tightness is in mid chest, started this am.

## 2021-03-24 NOTE — ED Notes (Signed)
Pt requested to leave AMA.  

## 2021-03-24 NOTE — ED Provider Notes (Signed)
Patient presents with chest pain and chest tightness.  Physical exam lungs clear heart regular rate and rhythm.  Patient in no acute distress.  Labs and x-rays ordered.  EKG unremarkable   Bethann Berkshire, MD 03/24/21 [phone]

## 2022-03-25 ENCOUNTER — Encounter (HOSPITAL_COMMUNITY): Payer: Self-pay

## 2022-03-25 ENCOUNTER — Emergency Department (HOSPITAL_COMMUNITY): Payer: 59

## 2022-03-25 ENCOUNTER — Emergency Department (HOSPITAL_COMMUNITY)
Admission: EM | Admit: 2022-03-25 | Discharge: 2022-03-25 | Disposition: A | Discharge status: Home / Self Care | Payer: 59 | Attending: Emergency Medicine | Admitting: Emergency Medicine

## 2022-03-25 DIAGNOSIS — Z79899 Other long term (current) drug therapy: Secondary | ICD-10-CM | POA: Insufficient documentation

## 2022-03-25 DIAGNOSIS — R109 Unspecified abdominal pain: Secondary | ICD-10-CM | POA: Insufficient documentation

## 2022-03-25 LAB — COMPREHENSIVE METABOLIC PANEL
ALT: 13 U/L (ref 0–44)
AST: 16 U/L (ref 15–41)
Albumin: 4.1 g/dL (ref 3.5–5.0)
Alkaline Phosphatase: 82 U/L (ref 38–126)
Anion gap: 6 (ref 5–15)
BUN: 11 mg/dL (ref 6–20)
CO2: 26 mmol/L (ref 22–32)
Calcium: 8.9 mg/dL (ref 8.9–10.3)
Chloride: 108 mmol/L (ref 98–111)
Creatinine, Ser: 0.68 mg/dL (ref 0.44–1.00)
GFR, Estimated: >60 mL/min (ref >60)
Glucose, Bld: 98 mg/dL (ref 70–99)
Potassium: 3.5 mmol/L (ref 3.5–5.1)
Sodium: 140 mmol/L (ref 135–145)
Total Bilirubin: 0.7 mg/dL (ref 0.3–1.2)
Total Protein: 7.4 g/dL (ref 6.5–8.1)

## 2022-03-25 LAB — URINALYSIS, ROUTINE W REFLEX MICROSCOPIC
Bilirubin Urine: NEGATIVE
Glucose, UA: NEGATIVE mg/dL
Hgb urine dipstick: NEGATIVE
Ketones, ur: NEGATIVE mg/dL
Leukocytes,Ua: NEGATIVE
Nitrite: NEGATIVE
Protein, ur: NEGATIVE mg/dL
Specific Gravity, Urine: 1.013 (ref 1.005–1.030)
pH: 7 (ref 5.0–8.0)

## 2022-03-25 LAB — CBC WITH DIFFERENTIAL/PLATELET
Abs Immature Granulocytes: 0.02 10*3/uL (ref 0.00–0.07)
Basophils Absolute: 0.1 10*3/uL (ref 0.0–0.1)
Basophils Relative: 1 %
Eosinophils Absolute: 0.2 10*3/uL (ref 0.0–0.5)
Eosinophils Relative: 3 %
HCT: 43.3 % (ref 36.0–46.0)
Hemoglobin: 14.9 g/dL (ref 12.0–15.0)
Immature Granulocytes: 0 %
Lymphocytes Relative: 31 %
Lymphs Abs: 2.4 10*3/uL (ref 0.7–4.0)
MCH: 32.3 pg (ref 26.0–34.0)
MCHC: 34.4 g/dL (ref 30.0–36.0)
MCV: 93.7 fL (ref 80.0–100.0)
Monocytes Absolute: 0.4 10*3/uL (ref 0.1–1.0)
Monocytes Relative: 5 %
Neutro Abs: 4.5 10*3/uL (ref 1.7–7.7)
Neutrophils Relative %: 60 %
Platelets: 348 10*3/uL (ref 150–400)
RBC: 4.62 MIL/uL (ref 3.87–5.11)
RDW: 12.5 % (ref 11.5–15.5)
WBC: 7.5 10*3/uL (ref 4.0–10.5)
nRBC: 0 % (ref 0.0–0.2)

## 2022-03-25 LAB — I-STAT BETA HCG BLOOD, ED (MC, WL, AP ONLY): I-stat hCG, quantitative: <5 m[IU]/mL (ref <5)

## 2022-03-25 LAB — LIPASE, BLOOD: Lipase: 28 U/L (ref 11–51)

## 2022-03-25 MED ORDER — KETOROLAC TROMETHAMINE 15 MG/ML IJ SOLN
15.0000 mg | Freq: Once | INTRAMUSCULAR | Status: AC
  Administered 2022-03-25: 15 mg via INTRAMUSCULAR
  Filled 2022-03-25: qty 1

## 2022-03-25 MED ORDER — OXYCODONE-ACETAMINOPHEN 5-325 MG PO TABS
1.0000 | ORAL_TABLET | Freq: Once | ORAL | Status: AC
  Administered 2022-03-25: 1 via ORAL
  Filled 2022-03-25: qty 1

## 2022-03-25 NOTE — ED Provider Notes (Signed)
[facility]-EMERGENCY DEPT Provider Note   CSN: [id number] Arrival date & time: 03/25/22  1240     History  Chief Complaint  Patient presents with   Flank Pain    Brianna Davis is a 46 y.o. female.  46 year old female with prior medical history as detailed below presents for evaluation.  Patient complains of low bilateral flank pain.  Patient's discomfort began 48 hours ago.  Patient reports intermittent discomfort for the last 48 hours.  This afternoon the patient's symptoms are significantly improved.  Patient with longstanding history of renal colic.  Patient is known to [facility] urology for same.  She denies fever.  She denies nausea or vomiting.  She denies urinary symptoms.  The history is provided by the patient and medical records.       Home Medications Prior to Admission medications   Medication Sig Start Date End Date Taking? Authorizing Provider  albuterol (VENTOLIN HFA) 108 (90 Base) MCG/ACT inhaler Inhale 1-2 puffs into the lungs every 6 (six) hours as needed for wheezing or shortness of breath. Patient not taking: No sig reported 03/15/21   Durward Parcel, FNP  azithromycin (ZITHROMAX) 250 MG tablet Take 1 tablet (250 mg total) by mouth daily. Take first 2 tablets together, then 1 every day until finished. Patient not taking: No sig reported 03/15/21   Durward Parcel, FNP  cetirizine (ZYRTEC ALLERGY) 10 MG tablet Take 1 tablet (10 mg total) by mouth daily. Patient not taking: No sig reported 11/19/19   Durward Parcel, FNP  levonorgestrel (MIRENA) 20 MCG/24HR IUD 1 each by Intrauterine route once.    [provider]  lisinopril (ZESTRIL) 10 MG tablet Take 10 mg by mouth daily. 03/18/21   [provider]  oxyCODONE-acetaminophen (PERCOCET/ROXICET) 5-325 MG tablet Take 1 tablet by mouth every 8 (eight) hours as needed for severe pain. Patient not taking: No sig reported 08/13/20   Farrel Gordon, PA-C  predniSONE  (STERAPRED UNI-PAK 21 TAB) 10 MG (21) TBPK tablet Take by mouth daily. Take 6 tabs by mouth daily  for 1 days, then 5 tabs for 1 days, then 4 tabs for 1 days, then 3 tabs for 1 days, 2 tabs for 1 days, then 1 tab by mouth daily for 1 days Patient not taking: No sig reported 03/15/21   Durward Parcel, FNP  tamsulosin (FLOMAX) 0.4 MG CAPS capsule Take 1 capsule (0.4 mg total) by mouth daily. Patient not taking: No sig reported 08/13/20   Farrel Gordon, PA-C      Allergies    Dilaudid [hydromorphone hcl]    Review of Systems   Review of Systems  All other systems reviewed and are negative.   Physical Exam Updated Vital Signs BP (!) 180/99 (BP Location: Right Arm)   Pulse 67   Temp 98.1 F (36.7 C) (Oral)   Resp 18   Ht 5\' 3"  (1.6 m)   Wt 83.9 kg   SpO2 96%   BMI 32.77 kg/m  Physical Exam Vitals and nursing note reviewed.  Constitutional:      General: She is not in acute distress.    Appearance: Normal appearance. She is well-developed.  HENT:     Head: Normocephalic and atraumatic.  Eyes:     Conjunctiva/sclera: Conjunctivae normal.     Pupils: Pupils are equal, round, and reactive to light.  Cardiovascular:     Rate and Rhythm: Normal rate and regular rhythm.     Heart sounds: Normal heart  sounds.  Pulmonary:     Effort: Pulmonary effort is normal. No respiratory distress.     Breath sounds: Normal breath sounds.  Abdominal:     General: There is no distension.     Palpations: Abdomen is soft.     Tenderness: There is no abdominal tenderness.  Musculoskeletal:        General: No deformity. Normal range of motion.     Cervical back: Normal range of motion and neck supple.  Skin:    General: Skin is warm and dry.  Neurological:     General: No focal deficit present.     Mental Status: She is alert and oriented to person, place, and time.     ED Results / Procedures / Treatments   Labs (all labs ordered are listed, but only abnormal results are  displayed) Labs Reviewed  URINALYSIS, ROUTINE W REFLEX MICROSCOPIC - Abnormal; Notable for the following components:      Result Value   APPearance CLOUDY (*)    All other components within normal limits  CBC WITH DIFFERENTIAL/PLATELET  COMPREHENSIVE METABOLIC PANEL  LIPASE, BLOOD  I-STAT BETA HCG BLOOD, ED ([facility], [facility], [facility] ONLY)    EKG None  Radiology CT Renal Stone Study  Result Date: 03/25/2022 CLINICAL DATA:  Right flank pain, kidney stone suspected. EXAM: CT ABDOMEN AND PELVIS WITHOUT CONTRAST TECHNIQUE: Multidetector CT imaging of the abdomen and pelvis was performed following the standard protocol without IV contrast. RADIATION DOSE REDUCTION: This exam was performed according to the departmental dose-optimization program which includes automated exposure control, adjustment of the mA and/or kV according to patient size and/or use of iterative reconstruction technique. COMPARISON:  CT abdomen pelvis August 13, 2020. FINDINGS: Lower chest: No acute abnormality. Hepatobiliary: Unremarkable noncontrast enhanced appearance of the hepatic parenchyma. Gallbladder is unremarkable. No biliary ductal dilation. Pancreas: No pancreatic ductal dilation or evidence of acute inflammation. Spleen: No splenomegaly or focal splenic lesion. Adrenals/Urinary Tract: Bilateral adrenal glands appear normal. No hydronephrosis. Punctate nonobstructive bilateral interpolar renal calculi. No obstructive ureteral or bladder calculus. Urinary bladder is unremarkable for degree of distension. Stomach/Bowel: No radiopaque enteric contrast material was administered. Stomach is unremarkable for degree of distension. No pathologic dilation of small or large bowel. The appendix and terminal ileum appear normal. No evidence of acute bowel inflammation Vascular/Lymphatic: Normal caliber abdominal aorta. No pathologically enlarged abdominal or pelvic lymph nodes. Reproductive: Intrauterine device appears appropriate in  positioning. No suspicious adnexal mass. Other: No significant abdominopelvic free fluid. Musculoskeletal: No discrete omental or peritoneal nodularity. IMPRESSION: Punctate nonobstructive bilateral interpolar renal calculi. No obstructive ureteral or bladder calculus. Electronically Signed   By: Dahlia Bailiff M.D.   On: 03/25/2022 14:43    Procedures Procedures    Medications Ordered in ED Medications  oxyCODONE-acetaminophen (PERCOCET/ROXICET) 5-325 MG per tablet 1 tablet (1 tablet Oral Given 03/25/22 1315)  ketorolac (TORADOL) 15 MG/ML injection 15 mg (15 mg Intramuscular Given 03/25/22 1751)    ED Course/ Medical Decision Making/ A&P                           Medical Decision Making Risk Prescription drug management.    Medical Screen Complete  This patient presented to the ED with complaint of flank pain.  This complaint involves an extensive number of treatment options. The initial differential diagnosis includes, but is not limited to, renal colic, UTI, metabolic abnormality, etc.  This presentation is: Acute, Self-Limited, Previously Undiagnosed, Uncertain Prognosis,  Complicated, Systemic Symptoms, and Threat to Life/Bodily Function  Patient with known history of renal colic presents with complaint of flank pain.  Describes symptoms are consistent with her prior episodes of renal colic.    Labs, imaging today are without evidence of acute pathology.  Patient is feeling significantly improved during her ED evaluation.  She does have an arranged follow-up tomorrow with [facility].  Importance of close follow-up stressed.  Strict return precautions given and understood.  Co morbidities that complicated the patient's evaluation  History of renal colic   Additional history obtained: External records from outside sources obtained and reviewed including prior ED visits and prior Inpatient records.    Lab Tests:  I ordered and personally interpreted labs.  The  pertinent results include: CBC, UA, hCG, CMP, lipase   Imaging Studies ordered:  I ordered imaging studies including CT abdomen pelvis I independently visualized and interpreted obtained imaging which showed NAD I agree with the radiologist interpretation. Medicines ordered:  I ordered medication including Toradol for pain Reevaluation of the patient after these medicines showed that the patient: resolved   Problem List / ED Course:  Flank pain   Reevaluation:  After the interventions noted above, I reevaluated the patient and found that they have: resolved Disposition:  After consideration of the diagnostic results and the patients response to treatment, I feel that the patent would benefit from close outpatient follow-up.          Final Clinical Impression(s) / ED Diagnoses Final diagnoses:  Flank pain    Rx / DC Orders ED Discharge Orders     None         Wynetta Fines, MD 03/25/22 Windell Moment

## 2022-03-25 NOTE — Discharge Instructions (Signed)
Follow-up with the [facility] tomorrow as instructed.  Turn for any problem.

## 2022-03-25 NOTE — ED Provider Triage Note (Signed)
Emergency Medicine Provider Triage Evaluation Note  Brianna Davis , a 46 y.o. female  was evaluated in triage.  Pt complains of flank pain. Started two nights ago. Pain started in mid back and radiates around the right flank. Also pain in LLQ. Kidney stone found in June. Followed by urology. Hematuria yesterday and increased frequency. No burning urination.  No fever. Taken oxycodone and flomax. Had multiple kidney stone. Had lithotripsy last year.   Review of Systems  Positive: See above Negative: See above  Physical Exam  BP (!) 188/115 (BP Location: Right Arm)   Pulse 87   Temp 98.8 F (37.1 C) (Oral)   Resp 18   Ht 5\' 3"  (1.6 m)   Wt 83.9 kg   SpO2 100%   BMI 32.77 kg/m  Gen:   Awake, no distress   Resp:  Normal effort  MSK:   Moves extremities without difficulty  Other:  Ruq tenderness  Medical Decision Making  Medically screening exam initiated at 1:07 PM.  Appropriate orders placed.  Brianna Davis was informed that the remainder of the evaluation will be completed by another provider, this initial triage assessment does not replace that evaluation, and the importance of remaining in the ED until their evaluation is complete.  Ct renal stone, labs, ua, preg, oxy   Harriet Pho, PA-C 03/25/22 1314

## 2022-03-25 NOTE — ED Triage Notes (Signed)
Pt presents with c/o flank pain across her lower back that radiates to her lower abdominal area as well. Pt reports a hx of kidney stones. Pt also reports some hematuria that occurred yesterday.

## 2022-05-17 ENCOUNTER — Ambulatory Visit: Payer: 59 | Admitting: Behavioral Health

## 2022-06-23 ENCOUNTER — Ambulatory Visit: Payer: 59 | Admitting: Behavioral Health

## 2022-06-23 NOTE — Progress Notes (Unsigned)
                Adal Sereno L Braylie Badami, LMFT 

## 2022-08-02 ENCOUNTER — Encounter (HOSPITAL_COMMUNITY): Payer: Self-pay | Admitting: Emergency Medicine

## 2022-08-02 ENCOUNTER — Emergency Department (HOSPITAL_COMMUNITY)
Admission: EM | Admit: 2022-08-02 | Discharge: 2022-08-03 | Disposition: A | Discharge status: Home / Self Care | Payer: 59 | Attending: Emergency Medicine | Admitting: Emergency Medicine

## 2022-08-02 ENCOUNTER — Emergency Department (HOSPITAL_COMMUNITY): Payer: 59

## 2022-08-02 ENCOUNTER — Other Ambulatory Visit: Payer: Self-pay

## 2022-08-02 DIAGNOSIS — I10 Essential (primary) hypertension: Secondary | ICD-10-CM

## 2022-08-02 DIAGNOSIS — R0789 Other chest pain: Secondary | ICD-10-CM | POA: Insufficient documentation

## 2022-08-02 DIAGNOSIS — E876 Hypokalemia: Secondary | ICD-10-CM | POA: Diagnosis not present

## 2022-08-02 DIAGNOSIS — R079 Chest pain, unspecified: Secondary | ICD-10-CM | POA: Diagnosis present

## 2022-08-02 LAB — BASIC METABOLIC PANEL
Anion gap: 11 (ref 5–15)
BUN: 14 mg/dL (ref 6–20)
CO2: 24 mmol/L (ref 22–32)
Calcium: 9.4 mg/dL (ref 8.9–10.3)
Chloride: 104 mmol/L (ref 98–111)
Creatinine, Ser: 0.78 mg/dL (ref 0.44–1.00)
GFR, Estimated: >60 mL/min (ref >60)
Glucose, Bld: 100 mg/dL — ABNORMAL HIGH (ref 70–99)
Potassium: 3.2 mmol/L — ABNORMAL LOW (ref 3.5–5.1)
Sodium: 139 mmol/L (ref 135–145)

## 2022-08-02 LAB — CBC
HCT: 43.5 % (ref 36.0–46.0)
Hemoglobin: 15.2 g/dL — ABNORMAL HIGH (ref 12.0–15.0)
MCH: 31.7 pg (ref 26.0–34.0)
MCHC: 34.9 g/dL (ref 30.0–36.0)
MCV: 90.8 fL (ref 80.0–100.0)
Platelets: 340 10*3/uL (ref 150–400)
RBC: 4.79 MIL/uL (ref 3.87–5.11)
RDW: 12.1 % (ref 11.5–15.5)
WBC: 7.9 10*3/uL (ref 4.0–10.5)
nRBC: 0 % (ref 0.0–0.2)

## 2022-08-02 LAB — PROTIME-INR
INR: 1.1 (ref 0.8–1.2)
Prothrombin Time: 13.8 seconds (ref 11.4–15.2)

## 2022-08-02 LAB — TROPONIN I (HIGH SENSITIVITY)
Troponin I (High Sensitivity): 2 ng/L (ref <18)
Troponin I (High Sensitivity): 2 ng/L (ref <18)

## 2022-08-02 NOTE — ED Triage Notes (Signed)
Pt via POV c/o central chest pain x 45 mins, worse on left. She says she is slightly lightheaded and felt like she had chills earlier. No cardiac hx reported.

## 2022-08-03 NOTE — Discharge Instructions (Addendum)
Please continue to monitor your blood pressure at home.  You should check your blood pressure once a day and keep a record of it.  Take that record with you whenever you see your primary care provider.  Please continue to try to eliminate use of all tobacco products.  Return if you have any new or concerning symptoms.

## 2022-08-03 NOTE — ED Provider Notes (Signed)
Brianna Davis Provider Note   CSN: [id number] Arrival date & time: 08/02/22  2048     History  Chief Complaint  Patient presents with   Chest Pain    Brianna Davis is a 47 y.o. female.  The history is provided by the patient.  Chest Pain She has history of hypertension and comes in because of chest pain which started about 7:30 PM today.  Pain was sharp and in the lower sternal area and then radiated around to the left anterior chest.  There is no associated dyspnea, nausea, diaphoresis.  Pain has now completely resolved.  When pain was present, nothing made it better and nothing made it worse.  She specifically denied pleuritic component or exertional component.  She denies history of diabetes or hyperlipidemia.  There is a family history of premature coronary atherosclerosis.   Home Medications Prior to Admission medications   Medication Sig Start Date End Date Taking? Authorizing Provider  albuterol (VENTOLIN HFA) 108 (90 Base) MCG/ACT inhaler Inhale 1-2 puffs into the lungs every 6 (six) hours as needed for wheezing or shortness of breath. Patient not taking: No sig reported 03/15/21   Emerson Monte, FNP  azithromycin (ZITHROMAX) 250 MG tablet Take 1 tablet (250 mg total) by mouth daily. Take first 2 tablets together, then 1 every day until finished. Patient not taking: No sig reported 03/15/21   Emerson Monte, FNP  cetirizine (ZYRTEC ALLERGY) 10 MG tablet Take 1 tablet (10 mg total) by mouth daily. Patient not taking: No sig reported 11/19/19   Emerson Monte, FNP  levonorgestrel (MIRENA) 20 MCG/24HR IUD 1 each by Intrauterine route once.    [provider]  lisinopril (ZESTRIL) 10 MG tablet Take 10 mg by mouth daily. 03/18/21   [provider]  oxyCODONE-acetaminophen (PERCOCET/ROXICET) 5-325 MG tablet Take 1 tablet by mouth every 8 (eight) hours as needed for severe pain. Patient not taking: No sig  reported 08/13/20   Alfredia Client, PA-C  predniSONE (STERAPRED UNI-PAK 21 TAB) 10 MG (21) TBPK tablet Take by mouth daily. Take 6 tabs by mouth daily  for 1 days, then 5 tabs for 1 days, then 4 tabs for 1 days, then 3 tabs for 1 days, 2 tabs for 1 days, then 1 tab by mouth daily for 1 days Patient not taking: No sig reported 03/15/21   Emerson Monte, FNP  tamsulosin (FLOMAX) 0.4 MG CAPS capsule Take 1 capsule (0.4 mg total) by mouth daily. Patient not taking: No sig reported 08/13/20   Alfredia Client, PA-C      Allergies    Dilaudid [hydromorphone hcl]    Review of Systems   Review of Systems  Cardiovascular:  Positive for chest pain.  All other systems reviewed and are negative.   Physical Exam Updated Vital Signs BP 127/86   Pulse 69   Temp 98.3 F (36.8 C) (Oral)   Resp 19   Ht 5' 3"$  (1.6 m)   Wt 81.6 kg   SpO2 100%   BMI 31.89 kg/m  Physical Exam Vitals and nursing note reviewed.   47 year old female, resting comfortably and in no acute distress. Vital signs are normal. Oxygen saturation is 100%, which is normal. Head is normocephalic and atraumatic. PERRLA, EOMI. Oropharynx is clear. Neck is nontender and supple without adenopathy or JVD. Back is nontender and there is no CVA tenderness. Lungs are clear without rales, wheezes, or rhonchi. Chest is nontender.  Heart has regular rate and rhythm without murmur. Abdomen is soft, flat, nontender. Extremities have no cyanosis or edema, full range of motion is present. Skin is warm and dry without rash. Neurologic: Mental status is normal, cranial nerves are intact, moves all extremities equally.  ED Results / Procedures / Treatments   Labs (all labs ordered are listed, but only abnormal results are displayed) Labs Reviewed  BASIC METABOLIC PANEL - Abnormal; Notable for the following components:      Result Value   Potassium 3.2 (*)    Glucose, Bld 100 (*)    All other components within normal limits  CBC - Abnormal;  Notable for the following components:   Hemoglobin 15.2 (*)    All other components within normal limits  PROTIME-INR  POC URINE PREG, ED  TROPONIN I (HIGH SENSITIVITY)  TROPONIN I (HIGH SENSITIVITY)    EKG EKG Interpretation  Date/Time:  Monday August 02 2022 20:58:11 EST Ventricular Rate:  94 PR Interval:  144 QRS Duration: 90 QT Interval:  368 QTC Calculation: 460 R Axis:   -11 Text Interpretation: Normal sinus rhythm Cannot rule out Anterior infarct , age undetermined Abnormal ECG When compared with ECG of 24-Mar-2021 09:10, No significant change was found Confirmed by Delora Fuel ([id number]) on 08/03/2022 12:15:28 AM  Radiology DG Chest 2 View  Result Date: 08/02/2022 CLINICAL DATA:  Chest pain EXAM: CHEST - 2 VIEW COMPARISON:  Chest x-ray 03/24/2021 FINDINGS: The heart size and mediastinal contours are within normal limits. Both lungs are clear. The visualized skeletal structures are unremarkable. IMPRESSION: No active cardiopulmonary disease. Electronically Signed   By: Ronney Asters M.D.   On: 08/02/2022 21:28    Procedures Procedures  Reticulocyte monitor shows normal sinus rhythm, per my interpretation.  Medications Ordered in ED Medications - No data to display  ED Course/ Medical Decision Making/ A&P   {   Click here for ABCD2, HEART and other calculatorsREFRESH Note before signing  HEART Score for Major Cardiac Events  ED from 08/02/2022 in [facility]   08/03/2022   0114     History Slightly suspicious  ECG Normal  Age 49-64  Risk Factors >2 risk factors or hx of atherosclerotic disease  Troponin Less than or equal to normal limit  HEART Score 3   PERC score for pulmonary embolism is 0  Wells criteria for pulmonary embolism is 0                         Medical Decision Making Amount and/or Complexity of Data Reviewed Labs: ordered. Radiology: ordered.   Chest pain which is markedly atypical, doubt ACS.   Specific etiology unclear.  No evidence of musculoskeletal pain, possible GERD.  I have reviewed and interpreted her electrocardiogram and my interpretation is possible old anterior myocardial infarction but no ST or T changes and unchanged from prior.  Chest x-ray shows no active part cardiopulmonary disease.  I have independently viewed the images, and agree with radiologist's interpretation.  I have reviewed and interpreted her laboratory test and my interpretation is normal troponin x 2, mild hypokalemia, normal CBC.  She has no risk factors for pulmonary embolism and PERC score and Wells score are 0.  Therefore, no need to screen with D-dimer.  Heart score is 3 which puts her at low risk for major adverse cardiac events in the next 6 months.  Patient states she is currently trying to quit  smoking.  I am referring her back to her primary care provider.  Final Clinical Impression(s) / ED Diagnoses Final diagnoses:  Atypical chest pain  Elevated blood pressure reading with diagnosis of hypertension    Rx / DC Orders ED Discharge Orders     None         Delora Fuel, MD [id number] [phone]

## 2023-03-03 ENCOUNTER — Ambulatory Visit
Admission: EM | Admit: 2023-03-03 | Discharge: 2023-03-03 | Disposition: A | Discharge status: Home / Self Care | Payer: 59 | Attending: Family Medicine | Admitting: Family Medicine

## 2023-03-03 ENCOUNTER — Telehealth: Payer: Self-pay | Admitting: Family Medicine

## 2023-03-03 DIAGNOSIS — U071 COVID-19: Secondary | ICD-10-CM | POA: Insufficient documentation

## 2023-03-03 DIAGNOSIS — J0100 Acute maxillary sinusitis, unspecified: Secondary | ICD-10-CM | POA: Diagnosis present

## 2023-03-03 MED ORDER — FLUTICASONE PROPIONATE 50 MCG/ACT NA SUSP: 1.0000 | Freq: Two times a day (BID) | NASAL | 2 refills | Status: AC

## 2023-03-03 MED ORDER — AMOXICILLIN-POT CLAVULANATE 875-125 MG PO TABS: 1.0000 | ORAL_TABLET | Freq: Two times a day (BID) | ORAL | 0 refills | Status: AC

## 2023-03-03 MED ORDER — PROMETHAZINE-DM 6.25-15 MG/5ML PO SYRP: 5.0000 mL | ORAL_SOLUTION | Freq: Four times a day (QID) | ORAL | 0 refills | Status: AC | PRN

## 2023-03-03 MED ORDER — FLUCONAZOLE 150 MG PO TABS: 150.0000 mg | ORAL_TABLET | Freq: Once | ORAL | 0 refills | Status: AC

## 2023-03-03 NOTE — Telephone Encounter (Signed)
Patient called asking for something for a yeast infection secondary to antibiotics

## 2023-03-03 NOTE — Telephone Encounter (Signed)
Also requesting cough syrup, sent

## 2023-03-03 NOTE — ED Provider Notes (Signed)
[facility]    CSN: [id number] Arrival date & time: 03/03/23  0855      History   Chief Complaint No chief complaint on file.   HPI Brianna Davis is a 47 y.o. female.   Patient presenting today with 2-week history of nasal congestion, bilateral ear pain, cough, fatigue.  States she was starting to improve but last night symptoms significantly worsened going into today.  Denies chest pain, shortness of breath, abdominal pain, nausea vomiting or diarrhea.  Taking Robitussin cold and flu with minimal relief.    Past Medical History:  Diagnosis Date   Anxiety    Degenerative disc disease    Degenerative disc disease, lumbar    IBS (irritable bowel syndrome)    Migraine    Nephrolithiasis    S/P endoscopy April 2012   Dr. Jena Gauss: friability at GE junction, negative Barrett's orH.pylori, diffuse gastric petechia    Patient Active Problem List   Diagnosis Date Noted   Screening for viral disease 04/25/2019   Ureteral calculus 12/25/2016   RUQ abdominal pain 10/14/2016   Nausea without vomiting 10/14/2016   Generalized anxiety disorder 09/10/2016   Postpartum care following cesarean delivery (12/19) 06/08/2014   Postoperative state 06/08/2014   Normal labor 06/06/2014   Pregnancy 05/17/2014   Gestational hypertension w/o significant proteinuria in 3rd trimester 05/15/2014   Acute pyelonephritis 09/11/2012   GERD (gastroesophageal reflux disease) 09/28/2010   Epigastric pain 09/28/2010   Dysphagia 09/28/2010   IBS (irritable bowel syndrome) 09/28/2010   Esophageal dysphagia 09/28/2010    Past Surgical History:  Procedure Laterality Date   ARTHROTOMY Left 01/24/2020   Procedure: ARTHROTOMY;  Surgeon: Bjorn Pippin, MD;  Location: [facility];  Service: Orthopedics;  Laterality: Left;   CESAREAN SECTION N/A 06/07/2014   Procedure: CESAREAN SECTION;  Surgeon: Genia Del, MD;  Location: [facility];  Service: Obstetrics;  Laterality: N/A;    CHROMOPERTUBATION Bilateral 04/03/2013   Procedure: CHROMOPERTUBATION with Methylene Blue, with patent right tube and proximal obstruction of left tube;  Surgeon: Genia Del, MD;  Location: [facility];  Service: Gynecology;  Laterality: Bilateral;   CYSTOSCOPY W/ URETERAL STENT PLACEMENT Left 12/25/2016   Procedure: CYSTOSCOPY WITH LEFT RETROGRADE PYELOGRAM/LEFT STONE EXTRACTION/LEFT URETERAL STENT PLACEMENT;  Surgeon: Marcine Matar, MD;  Location: [facility];  Service: Urology;  Laterality: Left;   ESOPHAGOGASTRODUODENOSCOPY  09/30/2010   OAC:ZYSAY hiatal hernia/Friability at the gastroesophageal junction with undulating Z-line versus short segment Barrett esophagus s/p bx   EXTRACORPOREAL SHOCK WAVE LITHOTRIPSY Right 08/18/2020   Procedure: EXTRACORPOREAL SHOCK WAVE LITHOTRIPSY (ESWL);  Surgeon: Sebastian Ache, MD;  Location: [facility];  Service: Urology;  Laterality: Right;  75 MINS   ileocolonoscopy  12/2007   Friable anal canal.   LAPAROSCOPY N/A 04/03/2013   Procedure: LAPAROSCOPY DIAGNOSTIC;  Surgeon: Genia Del, MD;  Location: [facility];  Service: Gynecology;  Laterality: N/A;   LIGAMENT REPAIR Left 01/24/2020   Procedure: REPAIR LATERAL COLLATERAL LIGAMENT ELBOW WITH LOCAL TISSUE, ARTHROTOMY ELBOW WITH JOINT EXPLORATION WITHOUT BIOPSY WITH REMOVAL OF LOOSE BODY, MICROFRACTURE;  Surgeon: Bjorn Pippin, MD;  Location: [facility];  Service: Orthopedics;  Laterality: Left;   ROBOTIC ASSISTED LAPAROSCOPIC LYSIS OF ADHESION N/A 04/03/2013   Procedure: ROBOTIC ASSISTED LAPAROSCOPIC LYSIS OF ADHESION; LYSIS OF ENDOMETRIOSIS ;  Surgeon: Genia Del, MD;  Location: [facility];  Service: Gynecology;  Laterality: N/A;    OB History     Gravida  2   Para  1   Term  1   Preterm      AB  1   Living  1      SAB  1   IAB      Ectopic      Multiple  0   Live Births  1            Home Medications    Prior to Admission medications    Medication Sig Start Date End Date Taking? Authorizing Provider  amoxicillin-clavulanate (AUGMENTIN) 875-125 MG tablet Take 1 tablet by mouth every 12 (twelve) hours. 03/03/23  Yes Particia Nearing, PA-C  fluticasone [facility]) 50 MCG/ACT nasal spray Place 1 spray into both nostrils 2 (two) times daily. 03/03/23  Yes Particia Nearing, PA-C  levonorgestrel (MIRENA) 20 MCG/24HR IUD 1 each by Intrauterine route once.    [provider]  lisinopril (ZESTRIL) 10 MG tablet Take 10 mg by mouth daily. 03/18/21   [provider]    Family History Family History  Problem Relation Age of Onset   Irritable bowel syndrome Mother    GI problems Sister        gastroparesis   Colon cancer Neg Hx    Inflammatory bowel disease Neg Hx    Liver disease Neg Hx     Social History Social History   Tobacco Use   Smoking status: Every Day    Current packs/day: 0.50    Average packs/day: 0.5 packs/day for 27.0 years (13.5 ttl pk-yrs)    Types: Cigarettes    Start date: 03/08/1996   Smokeless tobacco: Never  Vaping Use   Vaping status: Never Used  Substance Use Topics   Alcohol use: No    Alcohol/week: 0.0 standard drinks of alcohol   Drug use: No     Allergies   Dilaudid [hydromorphone hcl]   Review of Systems Review of Systems Per HPI  Physical Exam Triage Vital Signs ED Triage Vitals  Encounter Vitals Group     BP 03/03/23 1036 (!) 143/87     Systolic BP Percentile --      Diastolic BP Percentile --      Pulse Rate 03/03/23 1036 80     Resp 03/03/23 1036 13     Temp 03/03/23 1036 98.2 F (36.8 C)     Temp Source 03/03/23 1036 Oral     SpO2 03/03/23 1036 97 %     Weight --      Height --      Head Circumference --      Peak Flow --      Pain Score 03/03/23 1038 0     Pain Loc --      Pain Education --      Exclude from Growth Chart --    No data found.  Updated Vital Signs BP (!) 143/87 (BP Location: Right Arm)   Pulse 80   Temp 98.2 F (36.8  C) (Oral)   Resp 13   SpO2 97%   Breastfeeding No   Visual Acuity Right Eye Distance:   Left Eye Distance:   Bilateral Distance:    Right Eye Near:   Left Eye Near:    Bilateral Near:     Physical Exam Vitals and nursing note reviewed.  Constitutional:      Appearance: Normal appearance.  HENT:     Head: Atraumatic.     Right Ear: External ear normal.     Left Ear: External ear normal.  Ears:     Comments: Bilateral middle ear effusions    Nose: Congestion present.     Mouth/Throat:     Mouth: Mucous membranes are moist.     Pharynx: Posterior oropharyngeal erythema present.  Eyes:     Extraocular Movements: Extraocular movements intact.     Conjunctiva/sclera: Conjunctivae normal.  Cardiovascular:     Rate and Rhythm: Normal rate and regular rhythm.     Heart sounds: Normal heart sounds.  Pulmonary:     Effort: Pulmonary effort is normal.     Breath sounds: Normal breath sounds. No wheezing or rales.  Musculoskeletal:        General: Normal range of motion.     Cervical back: Normal range of motion and neck supple.  Skin:    General: Skin is warm and dry.  Neurological:     Mental Status: She is alert and oriented to person, place, and time.  Psychiatric:        Mood and Affect: Mood normal.        Thought Content: Thought content normal.      UC Treatments / Results  Labs (all labs ordered are listed, but only abnormal results are displayed) Labs Reviewed  SARS CORONAVIRUS 2 (TAT 6-24 HRS)    EKG   Radiology No results found.  Procedures Procedures (including critical care time)  Medications Ordered in UC Medications - No data to display  Initial Impression / Assessment and Plan / UC Course  I have reviewed the triage vital signs and the nursing notes.  Pertinent labs & imaging results that were available during my care of the patient were reviewed by me and considered in my medical decision making (see chart for details).     Vitals  and exam very reassuring today, given duration and worsening course will treat for sinusitis with Augmentin, Flonase, sinus rinses, supportive over-the-counter medications and home care.  COVID testing pending for rule out.  Return for worsening symptoms.  Final Clinical Impressions(s) / UC Diagnoses   Final diagnoses:  Acute non-recurrent maxillary sinusitis   Discharge Instructions   None    ED Prescriptions     Medication Sig Dispense Auth. Provider   amoxicillin-clavulanate (AUGMENTIN) 875-125 MG tablet Take 1 tablet by mouth every 12 (twelve) hours. 14 tablet Particia Nearing, PA-C   fluticasone [facility]) 50 MCG/ACT nasal spray Place 1 spray into both nostrils 2 (two) times daily. 16 g Particia Nearing, [place]      PDMP not reviewed this encounter.   Particia Nearing, [place] 03/03/23 1139

## 2023-03-03 NOTE — ED Triage Notes (Signed)
Pt c/o sinus pressure, ear pain, and cough, fatigue was sick x 2 weeks ago got better then started feeling bad again Tuesday evening.

## 2023-03-04 ENCOUNTER — Telehealth: Payer: Self-pay

## 2023-03-04 LAB — SARS CORONAVIRUS 2 (TAT 6-24 HRS): SARS Coronavirus 2: POSITIVE — AB

## 2023-12-29 ENCOUNTER — Emergency Department (HOSPITAL_BASED_OUTPATIENT_CLINIC_OR_DEPARTMENT_OTHER)
Admission: EM | Admit: 2023-12-29 | Discharge: 2023-12-30 | Disposition: A | Discharge status: Home / Self Care | Payer: Self-pay | Attending: Emergency Medicine | Admitting: Emergency Medicine

## 2023-12-29 ENCOUNTER — Emergency Department (HOSPITAL_BASED_OUTPATIENT_CLINIC_OR_DEPARTMENT_OTHER): Payer: Self-pay | Admitting: Radiology

## 2023-12-29 ENCOUNTER — Other Ambulatory Visit: Payer: Self-pay

## 2023-12-29 DIAGNOSIS — R1011 Right upper quadrant pain: Secondary | ICD-10-CM | POA: Insufficient documentation

## 2023-12-29 DIAGNOSIS — M546 Pain in thoracic spine: Secondary | ICD-10-CM | POA: Insufficient documentation

## 2023-12-29 DIAGNOSIS — R079 Chest pain, unspecified: Secondary | ICD-10-CM | POA: Insufficient documentation

## 2023-12-29 LAB — CBC
HCT: 42.8 % (ref 36.0–46.0)
Hemoglobin: 14.7 g/dL (ref 12.0–15.0)
MCH: 31.1 pg (ref 26.0–34.0)
MCHC: 34.3 g/dL (ref 30.0–36.0)
MCV: 90.5 fL (ref 80.0–100.0)
Platelets: 350 K/uL (ref 150–400)
RBC: 4.73 MIL/uL (ref 3.87–5.11)
RDW: 12.1 % (ref 11.5–15.5)
WBC: 6.9 K/uL (ref 4.0–10.5)
nRBC: 0 % (ref 0.0–0.2)

## 2023-12-29 LAB — BASIC METABOLIC PANEL WITH GFR
Anion gap: 13 (ref 5–15)
BUN: 7 mg/dL (ref 6–20)
CO2: 22 mmol/L (ref 22–32)
Calcium: 9.8 mg/dL (ref 8.9–10.3)
Chloride: 106 mmol/L (ref 98–111)
Creatinine, Ser: 0.77 mg/dL (ref 0.44–1.00)
GFR, Estimated: >60 mL/min (ref >60)
Glucose, Bld: 119 mg/dL — ABNORMAL HIGH (ref 70–99)
Potassium: 3.8 mmol/L (ref 3.5–5.1)
Sodium: 140 mmol/L (ref 135–145)

## 2023-12-29 LAB — TROPONIN T, HIGH SENSITIVITY: Troponin T High Sensitivity: <15 ng/L (ref <19)

## 2023-12-29 LAB — PREGNANCY, URINE: Preg Test, Ur: NEGATIVE

## 2023-12-29 NOTE — ED Triage Notes (Signed)
 Pt POV reporting RUQ/flank pain that began today, pain now moved to center of chest. Emesis x2. Hx kidney stones.

## 2023-12-30 ENCOUNTER — Emergency Department (HOSPITAL_BASED_OUTPATIENT_CLINIC_OR_DEPARTMENT_OTHER): Payer: Self-pay

## 2023-12-30 LAB — URINALYSIS, ROUTINE W REFLEX MICROSCOPIC
Bilirubin Urine: NEGATIVE
Glucose, UA: NEGATIVE mg/dL
Hgb urine dipstick: NEGATIVE
Ketones, ur: NEGATIVE mg/dL
Nitrite: NEGATIVE
Protein, ur: NEGATIVE mg/dL
Specific Gravity, Urine: 1.011 (ref 1.005–1.030)
pH: 6.5 (ref 5.0–8.0)

## 2023-12-30 LAB — HEPATIC FUNCTION PANEL
ALT: 11 U/L (ref 0–44)
AST: 17 U/L (ref 15–41)
Albumin: 4.2 g/dL (ref 3.5–5.0)
Alkaline Phosphatase: 99 U/L (ref 38–126)
Bilirubin, Direct: 0.1 mg/dL (ref 0.0–0.2)
Indirect Bilirubin: 0.2 mg/dL — ABNORMAL LOW (ref 0.3–0.9)
Total Bilirubin: 0.3 mg/dL (ref 0.0–1.2)
Total Protein: 7 g/dL (ref 6.5–8.1)

## 2023-12-30 LAB — TROPONIN T, HIGH SENSITIVITY: Troponin T High Sensitivity: <15 ng/L (ref <19)

## 2023-12-30 MED ORDER — ONDANSETRON 4 MG PO TBDP: ORAL_TABLET | ORAL | 0 refills | Status: AC

## 2023-12-30 MED ORDER — OXYCODONE-ACETAMINOPHEN 5-325 MG PO TABS
1.0000 | ORAL_TABLET | Freq: Once | ORAL | Status: AC
  Administered 2023-12-30: 1 via ORAL
  Filled 2023-12-30: qty 1

## 2023-12-30 MED ORDER — IOHEXOL 300 MG/ML  SOLN
100.0000 mL | Freq: Once | INTRAMUSCULAR | Status: AC | PRN
  Administered 2023-12-30: 100 mL via INTRAVENOUS

## 2023-12-30 MED ORDER — DICYCLOMINE HCL 20 MG PO TABS: 20.0000 mg | ORAL_TABLET | Freq: Three times a day (TID) | ORAL | 0 refills | Status: AC

## 2023-12-30 MED ORDER — PANTOPRAZOLE SODIUM 40 MG PO TBEC
40.0000 mg | DELAYED_RELEASE_TABLET | Freq: Every day | ORAL | 0 refills | Status: AC
Start: 2023-12-30 — End: ?

## 2023-12-30 MED ORDER — KETOROLAC TROMETHAMINE 30 MG/ML IJ SOLN
15.0000 mg | Freq: Once | INTRAMUSCULAR | Status: AC
  Administered 2023-12-30: 15 mg via INTRAVENOUS
  Filled 2023-12-30: qty 1

## 2023-12-30 NOTE — ED Provider Notes (Signed)
 [place] EMERGENCY DEPARTMENT AT [facility] Provider Note   CSN: [id number] Arrival date & time: 12/29/23  2119     Patient presents with: Abdominal Pain and Chest Pain   Brianna Davis is a 48 y.o. female.   Patient presents to the emergency department for evaluation of back and abdominal pain.  Patient reports that symptoms began about 6 hours ago.  At first she had pain in her right mid back.  Pain then started traveling around her side and reach the center of her abdomen/lower chest.  It did cause her to have nausea and vomiting.  She has been told she has a bad gallbladder and also has a history of kidney stones.       Prior to Admission medications   Medication Sig Start Date End Date Taking? Authorizing Provider  dicyclomine  (BENTYL ) 20 MG tablet Take 1 tablet (20 mg total) by mouth 3 (three) times daily before meals. 12/30/23  Yes Tyheim Vanalstyne, Lonni PARAS, MD  ondansetron  (ZOFRAN -ODT) 4 MG disintegrating tablet 4mg  ODT q4 hours prn nausea/vomit 12/30/23  Yes Emmakate Hypes, Lonni PARAS, MD  pantoprazole  (PROTONIX ) 40 MG tablet Take 1 tablet (40 mg total) by mouth daily. 12/30/23  Yes Loghan Kurtzman, Lonni PARAS, MD  amoxicillin -clavulanate (AUGMENTIN ) 875-125 MG tablet Take 1 tablet by mouth every 12 (twelve) hours. 03/03/23   Stuart Vernell Norris, PA-C  fluticasone  (FLONASE ) 50 MCG/ACT nasal spray Place 1 spray into both nostrils 2 (two) times daily. 03/03/23   Stuart Vernell Norris, PA-C  levonorgestrel (MIRENA) 20 MCG/24HR IUD 1 each by Intrauterine route once.    [provider]  lisinopril (ZESTRIL) 10 MG tablet Take 10 mg by mouth daily. 03/18/21   [provider]  promethazine -dextromethorphan (PROMETHAZINE -DM) 6.25-15 MG/5ML syrup Take 5 mLs by mouth 4 (four) times daily as needed. 03/03/23   Stuart Vernell Norris, PA-C    Allergies: Dilaudid  [hydromorphone  hcl]    Review of Systems  Updated Vital Signs BP (!) 151/78   Pulse 60   Temp 98.3  F (36.8 C) (Oral)   Resp 11   Ht 5' 3 (1.6 m)   Wt 79.4 kg   SpO2 96%   BMI 31.00 kg/m   Physical Exam Vitals and nursing note reviewed.  Constitutional:      General: She is not in acute distress.    Appearance: She is well-developed.  HENT:     Head: Normocephalic and atraumatic.     Mouth/Throat:     Mouth: Mucous membranes are moist.  Eyes:     General: Vision grossly intact. Gaze aligned appropriately.     Extraocular Movements: Extraocular movements intact.     Conjunctiva/sclera: Conjunctivae normal.  Cardiovascular:     Rate and Rhythm: Normal rate and regular rhythm.     Pulses: Normal pulses.     Heart sounds: Normal heart sounds, S1 normal and S2 normal. No murmur heard.    No friction rub. No gallop.  Pulmonary:     Effort: Pulmonary effort is normal. No respiratory distress.     Breath sounds: Normal breath sounds.  Abdominal:     General: Bowel sounds are normal.     Palpations: Abdomen is soft.     Tenderness: There is abdominal tenderness in the right upper quadrant. There is no guarding or rebound.     Hernia: No hernia is present.  Musculoskeletal:        General: No swelling.     Cervical back: Full passive range of motion without  pain, normal range of motion and neck supple. No spinous process tenderness or muscular tenderness. Normal range of motion.     Right lower leg: No edema.     Left lower leg: No edema.  Skin:    General: Skin is warm and dry.     Capillary Refill: Capillary refill takes less than 2 seconds.     Findings: No ecchymosis, erythema, rash or wound.  Neurological:     General: No focal deficit present.     Mental Status: She is alert and oriented to person, place, and time.     GCS: GCS eye subscore is 4. GCS verbal subscore is 5. GCS motor subscore is 6.     Cranial Nerves: Cranial nerves 2-12 are intact.     Sensory: Sensation is intact.     Motor: Motor function is intact.     Coordination: Coordination is intact.   Psychiatric:        Attention and Perception: Attention normal.        Mood and Affect: Mood normal.        Speech: Speech normal.        Behavior: Behavior normal.     (all labs ordered are listed, but only abnormal results are displayed) Labs Reviewed  BASIC METABOLIC PANEL WITH GFR - Abnormal; Notable for the following components:      Result Value   Glucose, Bld 119 (*)    All other components within normal limits  URINALYSIS, ROUTINE W REFLEX MICROSCOPIC - Abnormal; Notable for the following components:   Leukocytes,Ua SMALL (*)    Bacteria, UA RARE (*)    All other components within normal limits  HEPATIC FUNCTION PANEL - Abnormal; Notable for the following components:   Indirect Bilirubin 0.2 (*)    All other components within normal limits  CBC  PREGNANCY, URINE  TROPONIN T, HIGH SENSITIVITY  TROPONIN T, HIGH SENSITIVITY    EKG: EKG Interpretation Date/Time:  Thursday December 29 2023 21:28:18 EDT Ventricular Rate:  80 PR Interval:  141 QRS Duration:  102 QT Interval:  388 QTC Calculation: 448 R Axis:   -11  Text Interpretation: Sinus rhythm Normal ECG Confirmed by Haze Lonni PARAS [phone] on 12/29/2023 11:12:37 PM  Radiology: CT ABDOMEN PELVIS W CONTRAST Result Date: 12/30/2023 CLINICAL DATA:  Acute nonlocalized abdominal pain. Right upper quadrant and flank pain beginning today. Pain now centrally in the chest. Vomiting. Previous history of kidney stones. EXAM: CT ABDOMEN AND PELVIS WITH CONTRAST TECHNIQUE: Multidetector CT imaging of the abdomen and pelvis was performed using the standard protocol following bolus administration of intravenous contrast. RADIATION DOSE REDUCTION: This exam was performed according to the departmental dose-optimization program which includes automated exposure control, adjustment of the mA and/or kV according to patient size and/or use of iterative reconstruction technique. CONTRAST:  OMNIPAQUE  IOHEXOL  300 MG/ML  SOLN  COMPARISON:  Abdominal radiographs 03/26/2022. CT abdomen and pelvis 03/25/2022 FINDINGS: Lower chest: Lung bases are clear. Hepatobiliary: No focal liver abnormality is seen. No gallstones, gallbladder wall thickening, or biliary dilatation. Pancreas: Unremarkable. No pancreatic ductal dilatation or surrounding inflammatory changes. Spleen: Normal in size without focal abnormality. Adrenals/Urinary Tract: Adrenal glands are unremarkable. Kidneys are normal, without renal calculi, focal lesion, or hydronephrosis. Bladder is unremarkable. Stomach/Bowel: Stomach is within normal limits. Appendix appears normal. No evidence of bowel wall thickening, distention, or inflammatory changes. Vascular/Lymphatic: No significant vascular findings are present. No enlarged abdominal or pelvic lymph nodes. Reproductive: An intrauterine device is present.  Uterus and ovaries are not enlarged. Other: No abdominal wall hernia or abnormality. No abdominopelvic ascites. Musculoskeletal: No acute or significant osseous findings. IMPRESSION: No acute process demonstrated in the abdomen or pelvis. An intrauterine device is present. Electronically Signed   By: Elsie Gravely M.D.   On: 12/30/2023 00:31   DG Chest 2 View Result Date: 12/29/2023 CLINICAL DATA:  Chest pain. Right upper quadrant and flank pain beginning today. Vomiting. Previous history of kidney stones. EXAM: CHEST - 2 VIEW COMPARISON:  08/02/2022 FINDINGS: The heart size and mediastinal contours are within normal limits. Both lungs are clear. The visualized skeletal structures are unremarkable. IMPRESSION: No active cardiopulmonary disease. Electronically Signed   By: Elsie Gravely M.D.   On: 12/29/2023 22:01     Procedures   Medications Ordered in the ED  iohexol  (OMNIPAQUE ) 300 MG/ML solution 100 mL (100 mLs Intravenous Contrast Given 12/30/23 0021)  ketorolac  (TORADOL ) 30 MG/ML injection 15 mg (15 mg Intravenous Given 12/30/23 0057)  oxyCODONE -acetaminophen   (PERCOCET/ROXICET) 5-325 MG per tablet 1 tablet (1 tablet Oral Given 12/30/23 0057)                                    Medical Decision Making Amount and/or Complexity of Data Reviewed External Data Reviewed: labs and radiology. Labs: ordered. Decision-making details documented in ED Course. Radiology: ordered and independent interpretation performed. Decision-making details documented in ED Course. ECG/medicine tests: ordered and independent interpretation performed. Decision-making details documented in ED Course.  Risk Prescription drug management.   Differential Diagnosis considered includes, but not limited to: Cholelithiasis; cholecystitis; cholangitis; bowel obstruction; esophagitis; gastritis; peptic ulcer disease; pancreatitis; cardiac.  Presents to the emergency department for evaluation of abdominal and flank pain.  Patient started with pain in the mid right back area that has transitioned around to the central abdominal region.  She does have tenderness in the right upper quadrant.  She was told that she has gallbladder problems in the past but insurance did not allow her to do follow-up.  Reviewing her records, however, reveals multiple CTs with normal gallbladder and a HIDA scan several years ago that did not show any abnormality.  Patient underwent lab work here which is unremarkable.  Normal LFTs.  No leukocytosis.  CT scan does not show acute abnormality.  Ultrasound unavailable at this time but I do not feel she requires a formal ultrasound currently based on workup.  No evidence of ureterolithiasis or kidney infection.     Final diagnoses:  Right upper quadrant abdominal pain    ED Discharge Orders          Ordered    ondansetron  (ZOFRAN -ODT) 4 MG disintegrating tablet        12/30/23 0058    pantoprazole  (PROTONIX ) 40 MG tablet  Daily        12/30/23 0058    dicyclomine  (BENTYL ) 20 MG tablet  3 times daily before meals        12/30/23 0058                Haze Lonni PARAS, MD 12/30/23 [phone]
